# Patient Record
Sex: Male | Born: 1935
Health system: Southern US, Community
[De-identification: ages and names within clinical notes are randomized; demographics above are authoritative.]

## PROBLEM LIST (undated history)

## (undated) DIAGNOSIS — I2510 Atherosclerotic heart disease of native coronary artery without angina pectoris: Secondary | ICD-10-CM

## (undated) DIAGNOSIS — C44300 Unspecified malignant neoplasm of skin of unspecified part of face: Secondary | ICD-10-CM

## (undated) DIAGNOSIS — K5900 Constipation, unspecified: Secondary | ICD-10-CM

## (undated) DIAGNOSIS — M1990 Unspecified osteoarthritis, unspecified site: Secondary | ICD-10-CM

## (undated) DIAGNOSIS — J449 Chronic obstructive pulmonary disease, unspecified: Secondary | ICD-10-CM

## (undated) DIAGNOSIS — I10 Essential (primary) hypertension: Secondary | ICD-10-CM

## (undated) HISTORY — PX: CORONARY STENT PLACEMENT: SHX1402

## (undated) HISTORY — PX: BACK SURGERY: SHX140

---
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
## 2001-08-02 ENCOUNTER — Encounter: Payer: Self-pay | Admitting: Emergency Medicine

## 2001-08-02 ENCOUNTER — Emergency Department (HOSPITAL_COMMUNITY): Admission: EM | Admit: 2001-08-02 | Discharge: 2001-08-02 | Payer: Self-pay | Admitting: Emergency Medicine

## 2001-09-16 ENCOUNTER — Encounter: Payer: Self-pay | Admitting: *Deleted

## 2001-09-16 ENCOUNTER — Ambulatory Visit (HOSPITAL_COMMUNITY): Admission: RE | Admit: 2001-09-16 | Discharge: 2001-09-16 | Payer: Self-pay | Admitting: *Deleted

## 2001-09-22 ENCOUNTER — Encounter (HOSPITAL_COMMUNITY): Admission: RE | Admit: 2001-09-22 | Discharge: 2001-10-22 | Payer: Self-pay | Admitting: *Deleted

## 2001-11-02 ENCOUNTER — Encounter (HOSPITAL_COMMUNITY): Admission: RE | Admit: 2001-11-02 | Discharge: 2001-12-02 | Payer: Self-pay | Admitting: *Deleted

## 2001-12-04 ENCOUNTER — Encounter (HOSPITAL_COMMUNITY): Admission: RE | Admit: 2001-12-04 | Discharge: 2002-01-03 | Payer: Self-pay | Admitting: *Deleted

## 2002-01-04 ENCOUNTER — Encounter (HOSPITAL_COMMUNITY): Admission: RE | Admit: 2002-01-04 | Discharge: 2002-02-03 | Payer: Self-pay | Admitting: *Deleted

## 2002-02-23 ENCOUNTER — Ambulatory Visit (HOSPITAL_COMMUNITY): Admission: RE | Admit: 2002-02-23 | Discharge: 2002-02-23 | Payer: Self-pay | Admitting: Ophthalmology

## 2003-11-15 ENCOUNTER — Ambulatory Visit (HOSPITAL_COMMUNITY): Admission: RE | Admit: 2003-11-15 | Discharge: 2003-11-15 | Payer: Self-pay | Admitting: Pulmonary Disease

## 2008-07-06 ENCOUNTER — Ambulatory Visit (HOSPITAL_COMMUNITY): Admission: RE | Admit: 2008-07-06 | Discharge: 2008-07-06 | Payer: Self-pay | Admitting: Internal Medicine

## 2010-07-06 ENCOUNTER — Ambulatory Visit (HOSPITAL_COMMUNITY): Admission: RE | Admit: 2010-07-06 | Discharge: 2010-07-06 | Payer: Self-pay | Admitting: Pulmonary Disease

## 2011-07-22 ENCOUNTER — Other Ambulatory Visit (HOSPITAL_COMMUNITY): Payer: Self-pay | Admitting: Pulmonary Disease

## 2011-07-22 ENCOUNTER — Ambulatory Visit (HOSPITAL_COMMUNITY)
Admission: RE | Admit: 2011-07-22 | Discharge: 2011-07-22 | Disposition: A | Discharge status: Home / Self Care | Payer: PRIVATE HEALTH INSURANCE | Source: Ambulatory Visit | Attending: Pulmonary Disease | Admitting: Pulmonary Disease

## 2011-07-22 DIAGNOSIS — R61 Generalized hyperhidrosis: Secondary | ICD-10-CM

## 2011-07-22 DIAGNOSIS — I2510 Atherosclerotic heart disease of native coronary artery without angina pectoris: Secondary | ICD-10-CM | POA: Insufficient documentation

## 2012-08-11 ENCOUNTER — Encounter (HOSPITAL_COMMUNITY): Payer: Self-pay | Admitting: *Deleted

## 2012-08-11 ENCOUNTER — Emergency Department (HOSPITAL_COMMUNITY)
Admission: EM | Admit: 2012-08-11 | Discharge: 2012-08-12 | Disposition: A | Discharge status: Home / Self Care | Payer: PRIVATE HEALTH INSURANCE | Attending: Emergency Medicine | Admitting: Emergency Medicine

## 2012-08-11 ENCOUNTER — Emergency Department (HOSPITAL_COMMUNITY): Payer: PRIVATE HEALTH INSURANCE

## 2012-08-11 DIAGNOSIS — K5900 Constipation, unspecified: Secondary | ICD-10-CM | POA: Insufficient documentation

## 2012-08-11 DIAGNOSIS — J984 Other disorders of lung: Secondary | ICD-10-CM | POA: Insufficient documentation

## 2012-08-11 DIAGNOSIS — I2510 Atherosclerotic heart disease of native coronary artery without angina pectoris: Secondary | ICD-10-CM | POA: Insufficient documentation

## 2012-08-11 DIAGNOSIS — E119 Type 2 diabetes mellitus without complications: Secondary | ICD-10-CM | POA: Insufficient documentation

## 2012-08-11 DIAGNOSIS — I10 Essential (primary) hypertension: Secondary | ICD-10-CM | POA: Insufficient documentation

## 2012-08-11 HISTORY — DX: Constipation, unspecified: K59.00

## 2012-08-11 HISTORY — DX: Atherosclerotic heart disease of native coronary artery without angina pectoris: I25.10

## 2012-08-11 HISTORY — DX: Essential (primary) hypertension: I10

## 2012-08-11 LAB — BASIC METABOLIC PANEL
BUN: 26 mg/dL — ABNORMAL HIGH (ref 6–23)
Chloride: 96 mEq/L (ref 96–112)
GFR calc Af Amer: >90 mL/min (ref >90)
GFR calc non Af Amer: 81 mL/min — ABNORMAL LOW (ref >90)
Glucose, Bld: 177 mg/dL — ABNORMAL HIGH (ref 70–99)
Potassium: 4.2 mEq/L (ref 3.5–5.1)
Sodium: 135 mEq/L (ref 135–145)

## 2012-08-11 LAB — CBC WITH DIFFERENTIAL/PLATELET
Basophils Relative: 0 % (ref 0–1)
Eosinophils Absolute: 0.4 10*3/uL (ref 0.0–0.7)
Hemoglobin: 15.9 g/dL (ref 13.0–17.0)
Lymphs Abs: 2.4 10*3/uL (ref 0.7–4.0)
MCH: 30.5 pg (ref 26.0–34.0)
Monocytes Relative: 9 % (ref 3–12)
Neutro Abs: 8.8 10*3/uL — ABNORMAL HIGH (ref 1.7–7.7)
Neutrophils Relative %: 69 % (ref 43–77)
Platelets: 157 10*3/uL (ref 150–400)
RBC: 5.22 MIL/uL (ref 4.22–5.81)

## 2012-08-11 MED ORDER — IOHEXOL 300 MG/ML  SOLN
100.0000 mL | Freq: Once | INTRAMUSCULAR | Status: AC | PRN
  Administered 2012-08-11: 100 mL via INTRAVENOUS

## 2012-08-11 MED ORDER — POLYETHYLENE GLYCOL 3350 17 GM/SCOOP PO POWD: 17.0000 g | Freq: Every day | ORAL | Status: DC

## 2012-08-11 MED ORDER — SODIUM CHLORIDE 0.9 % IV BOLUS (SEPSIS)
500.0000 mL | Freq: Once | INTRAVENOUS | Status: AC
  Administered 2012-08-11: 22:00:00 via INTRAVENOUS

## 2012-08-11 MED ORDER — FLEET ENEMA 7-19 GM/118ML RE ENEM
1.0000 | ENEMA | Freq: Once | RECTAL | Status: AC
  Administered 2012-08-11: 1 via RECTAL

## 2012-08-11 MED ORDER — MAGNESIUM CITRATE PO SOLN: 296.0000 mL | Freq: Once | ORAL | Status: DC

## 2012-08-11 MED ORDER — SODIUM CHLORIDE 0.9 % IV SOLN: INTRAVENOUS | Status: DC

## 2012-08-11 NOTE — ED Provider Notes (Signed)
History  This chart was scribed for [employer]. Rubin Payor, MD by Bennett Scrape. This patient was seen in room APA01/APA01 and the patient's care was started at 1951  CSN: [id number]  Arrival date & time 08/11/12  1913   First MD Initiated Contact with Patient 08/11/12 1951      Chief Complaint  Patient presents with  . Constipation     The history is provided by the patient. No language interpreter was used.    Joshua Fletcher is a 76 y.o. male who presents to the Emergency Department complaining of constant constipation described as no BMs for the past 4 days with associated lower abdomen pain described as cramping. He also reports that he has cramping and pressure in the rectum associated with the urge to have a BM. He reported taking a stool softener with no improvement in symptoms. He states that he has experienced  similar symptoms occasionally attributed to his Percocet prescription for the chronic back pain. He denies having any changes. He denies fever, neck pain, sore throat, visual disturbance, CP, cough, SOB, nausea, emesis, diarrhea, urinary symptoms, HA, weakness, numbness and rash as associated symptoms. He has a h/o DM, HTN , CAD and an appendectomy. He is a current everyday smoker but denies alcohol use.   Past Medical History  Diagnosis Date  . Constipation   . Diabetes mellitus   . Hypertension   . Coronary artery disease     Past Surgical History  Procedure Date  . Back surgery   . Coronary stent placement   . Appendectomy 1958  appendectomy per pt at bedside   No family history on file.  History  Substance Use Topics  . Smoking status: Current Every Day Smoker -- 0.5 packs/day    Types: Cigarettes  . Smokeless tobacco: Not on file  . Alcohol Use: No      Review of Systems  Constitutional:       Per HPI, otherwise negative  HENT:       Per HPI, otherwise negative  Eyes: Negative.   Respiratory:       Per HPI, otherwise negative  Cardiovascular:         Per HPI, otherwise negative  Gastrointestinal: Positive for abdominal pain and constipation. Negative for vomiting.  Genitourinary: Negative.   Musculoskeletal:       Per HPI, otherwise negative  Skin: Negative.   Neurological: Negative for syncope.    Allergies  Review of patient's allergies indicates no known allergies.  Home Medications   Current Outpatient Rx  Name Route Sig Dispense Refill  . MAGNESIUM CITRATE PO SOLN Oral Take 296 mLs by mouth once. 300 mL 0  . POLYETHYLENE GLYCOL 3350 PO POWD Oral Take 17 g by mouth daily. 255 g 0    Triage Vitals: BP 140/72  Pulse 98  Temp 98.2 F (36.8 C) (Oral)  Resp 20  Ht 6\' 2"  (1.88 m)  Wt 200 lb (90.719 kg)  BMI 25.68 kg/m2  SpO2 99%  Physical Exam  Nursing note and vitals reviewed. Constitutional: He is oriented to person, place, and time. He appears well-developed and well-nourished. No distress.  HENT:  Head: Normocephalic and atraumatic.  Eyes: EOM are normal.  Neck: Neck supple. No tracheal deviation present.  Cardiovascular: Normal rate, regular rhythm and normal heart sounds.   Pulmonary/Chest: Effort normal and breath sounds normal. No respiratory distress.  Abdominal: He exhibits mass. There is tenderness. There is no rebound and no guarding.  suprapubic to RLQ mass with mild diffuse tenderness below the umbilicus, no rebound tenderness, no guarding, and no hernia  Musculoskeletal: Normal range of motion.  Neurological: He is alert and oriented to person, place, and time.  Skin: Skin is warm and dry.  Psychiatric: He has a normal mood and affect. His behavior is normal.   rectal exam showed some stool, although not at a  place it could be disimpacted. ED Course  Procedures (including critical care time)  DIAGNOSTIC STUDIES: Oxygen Saturation is 99% on room air, normal by my interpretation.    COORDINATION OF CARE: 2000- Discussed treatment plan with pt at bedside and pt agreed to plan. Ordered an  X-ray of the abdomen.   2210-Pt rechecked and is resting comfortably. Informed pt that x-ray showed a possible small bowel obstruction. Discussed plan to order CT and further blood work and pt agreed.   Labs Reviewed  CBC WITH DIFFERENTIAL - Abnormal; Notable for the following:    WBC 12.8 (*)     Neutro Abs 8.8 (*)     Monocytes Absolute 1.1 (*)     All other components within normal limits  BASIC METABOLIC PANEL - Abnormal; Notable for the following:    Glucose, Bld 177 (*)     BUN 26 (*)     GFR calc non Af Amer 81 (*)     All other components within normal limits   Ct Abdomen Pelvis W Contrast  08/11/2012  *RADIOLOGY REPORT*  Clinical Data: Abdominal pain.  Chronic constipation.  Diabetes. Hypertension.  CT ABDOMEN AND PELVIS WITH CONTRAST  Technique:  Multidetector CT imaging of the abdomen and pelvis was performed following the standard protocol during bolus administration of intravenous contrast.  Contrast: OMNIPAQUE IOHEXOL 300 MG/ML  SOLN  Comparison: 08/11/2012  Findings: Fibrosis noted in the lung bases.  Mild nodularity partially imaged in the right middle lobe measuring 5 mm, image 1 of series 3.  Distal esophageal wall thickening is observed.  Duodenal wall thickening is also appreciated.  Stomach unremarkable.  No significant hepatic or splenic lesion identified.  Pancreas and adrenal glands normal.  The kidneys appear unremarkable, as do the proximal ureters.  Aortoiliac atherosclerotic calcification noted.  Small porta hepatis nodes are not pathologically enlarged by size criteria.  The gallbladder and biliary system appear unremarkable.  No dilated bowel noted.  Scattered colonic diverticula noted without active diverticulitis.  Orally administered contrast extends through to the colon.  Urinary bladder appears unremarkable.  No free pelvic fluid or pelvic adenopathy.  Dextroconvex lumbar scoliosis noted with associated spondylosis. There is likely a right foraminal  impingement at L4-5.  Mild prominence of stool in the rectal vault noted.  IMPRESSION:  1.  Duodenal wall thickening may reflect duodenitis or mild sprue. 2.  There is also wall thickening in the distal esophagus circumferentially in its visualized portion, query esophagitis. Endoscopic assessment may be warranted. 3.  Mildly distended urinary bladder, likely incidental. 4.  Lumbar scoliosis and spondylosis.  Five of slight prominence of frothy stool in the rectal vault, but no dilated jejunum or ileum noted. 5.  Atherosclerosis. 6.  Fibrosis in the lung bases.  7.  5 mm nodule in the right middle lobe. If the patient is at high risk for bronchogenic carcinoma, follow-up chest CT at 6-12 months is recommended.  If the patient is at low risk for bronchogenic carcinoma, follow-up chest CT at 12 months is recommended.  This recommendation follows the consensus statement: Guidelines for  Management of Small Pulmonary Nodules Detected on CT Scans: A Statement from the Fleischner Society as published in Radiology 2005; 237:395-400.   Original Report Authenticated By: Dellia Cloud, M.D.    Dg Abd 2 Views  08/11/2012  *RADIOLOGY REPORT*  Clinical Data: Diabetes.  Hypertension.  Chronic constipation. Abdominal pain.  ABDOMEN - 2 VIEW  Comparison: 07/24/2010 lumbar spine radiographs  Findings: Dextroconvex lumbar scoliosis noted with underlying spondylosis and loss of disc height at multiple levels.  No free peroneal gas is observed beneath the hemidiaphragms. Several air-fluid levels are present in loops of small bowel with a borderline dilated loop of small bowel in the left abdomen.  Prominence of distal colonic stool is observed.  IMPRESSION:  1.  Abnormal but nonspecific appearance of the bowel, with scattered air-fluid levels in primarily nondilated loops of small bowel, although there is a borderline dilated loop of bowel in the left abdomen.  Appearance favors enteritis or ileus, although early obstruction  could have a similar appearance. 2.  Lumbar spondylosis, scoliosis, and degenerative disc disease.   Original Report Authenticated By: Dellia Cloud, M.D.      1. Constipation       MDM  Patient presents constipation. There is a palpable suprapubic mass. X-ray was done and showed possible early small bowel obstruction. CT was done and showed some wall thickening in the esophagus and a lung nodule. Both will need. Patient was given an enema in the ER and will be discharged home with mag citrate and MiraLAX. He will follow with his primary care Dr.      I personally performed the services described in this documentation, which was scribed in my presence. The recorded information has been reviewed and considered.     Juliet Rude. Rubin Payor, MD 08/11/12 [id number]

## 2012-08-11 NOTE — ED Notes (Signed)
Pt presents with c/o constipation, last BM was Friday. Abdominal cavity is soft, distended, with positive bowel sounds x 4 quads. Pt denies N/V, and fever. States has had this difficulty in past, just never this severe. Pt takes a narcotic pain medication and reports does not drink enough water.

## 2012-08-11 NOTE — ED Notes (Signed)
Pt states that he has not had a BM in 4 days, c/o pain lower abd pain

## 2015-01-20 ENCOUNTER — Other Ambulatory Visit (HOSPITAL_COMMUNITY): Payer: Self-pay | Admitting: Radiology

## 2015-01-20 DIAGNOSIS — G4730 Sleep apnea, unspecified: Secondary | ICD-10-CM

## 2015-01-20 DIAGNOSIS — G4710 Hypersomnia, unspecified: Secondary | ICD-10-CM

## 2015-02-16 ENCOUNTER — Ambulatory Visit: Payer: Medicare Other | Attending: Pulmonary Disease | Admitting: Sleep Medicine

## 2015-02-16 DIAGNOSIS — R0681 Apnea, not elsewhere classified: Secondary | ICD-10-CM | POA: Diagnosis present

## 2015-02-16 DIAGNOSIS — G4761 Periodic limb movement disorder: Secondary | ICD-10-CM | POA: Insufficient documentation

## 2015-02-16 DIAGNOSIS — G4730 Sleep apnea, unspecified: Secondary | ICD-10-CM

## 2015-02-16 DIAGNOSIS — G4710 Hypersomnia, unspecified: Secondary | ICD-10-CM

## 2015-02-16 DIAGNOSIS — G4733 Obstructive sleep apnea (adult) (pediatric): Secondary | ICD-10-CM | POA: Insufficient documentation

## 2015-02-18 NOTE — Sleep Study (Signed)
  [place] A. Merlene Laughter, MD     [email or web address]        NOCTURNAL POLYSOMNOGRAM    LOCATION: SLEEP LAB FACILITY: [facility]   PHYSICIAN: Caidance Sybert A. Merlene Laughter, M.D.   DATE OF STUDY: 02/16/2015.   REFERRING PHYSICIAN: Sinda Du.   INDICATIONS: The patient is a 79 year old male who complains of marked daytime drowsiness, witnessed apnea and fatigue.  MEDICATIONS:  Prior to Admission medications   Medication Sig Start Date End Date Taking? Authorizing Provider  magnesium citrate solution Take 296 mLs by mouth once. 08/11/12   Davonna Belling, MD  polyethylene glycol powder (GLYCOLAX/MIRALAX) powder Take 17 g by mouth daily. 08/11/12   Davonna Belling, MD      EPWORTH SLEEPINESS SCALE: 6.   BMI: 28.   ARCHITECTURAL SUMMARY: The study is a split-night recording with initial portion been a documentary and the second portion a titration recording. Total recording time was 392 minutes. Sleep efficiency 52 %. Sleep latency 5 minutes. REM latency 27 minutes scored by the computer but corrected to be 97.5 minutes manually. Stage NI 34 %, N2 56 % and N3 0 % and REM sleep 10 %.    RESPIRATORY DATA:  Baseline oxygen saturation is 93 %. The lowest saturation is 73 %. The diagnostic AHI is 65. The patient was placed on positive pressure starting at 5 and increased to 15. Titration was adequate but not optimal. The patient stayed awake for a lot of the recording indicated by the reduced sleep efficiency. Pressures of 13-14 seems to be adequate for the patient.  LIMB MOVEMENT SUMMARY: PLM index 30.   ELECTROCARDIOGRAM SUMMARY: Average heart rate is 64 with no significant dysrhythmias observed.   IMPRESSION:  1. Severe obstructive sleep apnea syndrome which responds to a CPAP of 14. 2. Moderate periodic limb movement disorder sleep. 3. Abnormal sleep architecture with reduced sleep efficiency and absent slow-wave sleep.  Thanks for this referral.  Kamau Weatherall A. Merlene Laughter,  M.D. Diplomat, [employer] adviser of [employer] Medicine.

## 2015-08-23 ENCOUNTER — Other Ambulatory Visit: Payer: Self-pay

## 2015-08-23 MED ORDER — INSULIN PEN NEEDLE 32G X 4 MM MISC: 24.0000 [IU] | Freq: Every day | Status: DC

## 2015-10-17 LAB — HEMOGLOBIN A1C: HEMOGLOBIN A1C: 7.4 % — AB (ref 4.0–6.0)

## 2015-10-24 ENCOUNTER — Ambulatory Visit (INDEPENDENT_AMBULATORY_CARE_PROVIDER_SITE_OTHER): Payer: Medicare Other | Admitting: "Endocrinology

## 2015-10-24 ENCOUNTER — Encounter: Payer: Self-pay | Admitting: "Endocrinology

## 2015-10-24 VITALS — BP 113/71 | HR 63 | Ht 72.0 in | Wt 201.0 lb

## 2015-10-24 DIAGNOSIS — E785 Hyperlipidemia, unspecified: Secondary | ICD-10-CM | POA: Diagnosis not present

## 2015-10-24 DIAGNOSIS — I10 Essential (primary) hypertension: Secondary | ICD-10-CM

## 2015-10-24 DIAGNOSIS — E1165 Type 2 diabetes mellitus with hyperglycemia: Secondary | ICD-10-CM | POA: Diagnosis not present

## 2015-10-24 DIAGNOSIS — IMO0002 Reserved for concepts with insufficient information to code with codable children: Secondary | ICD-10-CM

## 2015-10-24 DIAGNOSIS — Z794 Long term (current) use of insulin: Secondary | ICD-10-CM

## 2015-10-24 DIAGNOSIS — E118 Type 2 diabetes mellitus with unspecified complications: Secondary | ICD-10-CM | POA: Diagnosis not present

## 2015-10-24 DIAGNOSIS — E782 Mixed hyperlipidemia: Secondary | ICD-10-CM | POA: Insufficient documentation

## 2015-10-24 MED ORDER — INSULIN PEN NEEDLE 32G X 4 MM MISC: Status: DC

## 2015-10-24 NOTE — Progress Notes (Signed)
Subjective:    Patient ID: Joshua Fletcher, male    DOB: August 23, 1936, PCP Joshua Bogus, MD   Past Medical History  Diagnosis Date  . Constipation   . Diabetes mellitus   . Hypertension   . Coronary artery disease    Past Surgical History  Procedure Laterality Date  . Back surgery    . Coronary stent placement    . Appendectomy  1958   Social History   Social History  . Marital Status: Married    Spouse Name: N/A  . Number of Children: N/A  . Years of Education: N/A   Social History Main Topics  . Smoking status: Current Every Day Smoker -- 0.50 packs/day    Types: Cigarettes  . Smokeless tobacco: None  . Alcohol Use: No  . Drug Use: No  . Sexual Activity: Not Asked   Other Topics Concern  . None   Social History Narrative   Outpatient Encounter Prescriptions as of 10/24/2015  Medication Sig  . gabapentin (NEURONTIN) 300 MG capsule Take 300 mg by mouth at bedtime.  . Insulin Detemir (LEVEMIR FLEXTOUCH) 100 UNIT/ML Pen Inject 24 Units into the skin at bedtime.  Marland Kitchen lisinopril (PRINIVIL,ZESTRIL) 10 MG tablet Take 10 mg by mouth daily.  . metFORMIN (GLUCOPHAGE) 1000 MG tablet Take 1,000 mg by mouth 2 (two) times daily with a meal.  . metoprolol (LOPRESSOR) 50 MG tablet Take 50 mg by mouth 2 (two) times daily.  . simvastatin (ZOCOR) 40 MG tablet Take 40 mg by mouth daily.  . Insulin Pen Needle (BD PEN NEEDLE NANO U/F) 32G X 4 MM MISC Use as directed  . magnesium citrate solution Take 296 mLs by mouth once.  . polyethylene glycol powder (GLYCOLAX/MIRALAX) powder Take 17 g by mouth daily.  . [DISCONTINUED] Insulin Pen Needle (BD PEN NEEDLE NANO U/F) 32G X 4 MM MISC 24 Units by Does not apply route at bedtime.   No facility-administered encounter medications on file as of 10/24/2015.   ALLERGIES: No Known Allergies VACCINATION STATUS:  There is no immunization history on file for this patient.  HPI 79 yo male with type 2 DM x 16 years. He is here for f/u for  same. He did monitor BG at fasting, getting better and near target.  he is now consistent in taking his medications. He is on Levemir 20 units , and MTF 1000gm po BID. His recent A1c has improved to 7.4% from  8.5%. no hypoglycemia, but brings in only a week worth of logs.  He has reported complications including CAD which required stent placement, CHF and likely retinopathy. Pt is also being treated for neuropathy, pain syndrome, HTN and HPL. he continues to smoke 1PPD and he is not ready to quit. no other interval problems reported.  Review of Systems  Constitutional: Negative for fatigue and unexpected weight change.  HENT: Negative for dental problem, mouth sores and trouble swallowing.   Eyes: Negative for visual disturbance.  Respiratory: Negative for cough, choking, chest tightness, shortness of breath and wheezing.   Cardiovascular: Negative for chest pain, palpitations and leg swelling.  Gastrointestinal: Negative for nausea, vomiting, abdominal pain, diarrhea, constipation and abdominal distention.  Endocrine: Negative for polydipsia, polyphagia and polyuria.  Genitourinary: Negative for dysuria, urgency, hematuria and flank pain.  Musculoskeletal: Negative for myalgias, back pain, gait problem and neck pain.  Skin: Negative for pallor, rash and wound.  Neurological: Negative for seizures, syncope, weakness, numbness and headaches.  Psychiatric/Behavioral: Negative.  Negative  for confusion and dysphoric mood.    Objective:    BP 113/71 mmHg  Pulse 63  Ht 6' (1.829 m)  Wt 201 lb (91.173 kg)  BMI 27.25 kg/m2  SpO2 94%  Wt Readings from Last 3 Encounters:  10/24/15 201 lb (91.173 kg)  02/16/15 205 lb (92.987 kg)  08/11/12 200 lb (90.719 kg)    Physical Exam  Constitutional: He is oriented to person, place, and time. He appears well-developed and well-nourished. He is cooperative. No distress.  HENT:  Head: Normocephalic and atraumatic.  Eyes: EOM are normal.  Neck:  Normal range of motion. Neck supple. No tracheal deviation present. No thyromegaly present.  Cardiovascular: Normal rate, S1 normal, S2 normal and normal heart sounds.  Exam reveals no gallop.   No murmur heard. Pulses:      Dorsalis pedis pulses are 1+ on the right side, and 1+ on the left side.       Posterior tibial pulses are 1+ on the right side, and 1+ on the left side.  Pulmonary/Chest: Breath sounds normal. No respiratory distress. He has no wheezes.  Abdominal: Soft. Bowel sounds are normal. He exhibits no distension. There is no tenderness. There is no guarding and no CVA tenderness.  Musculoskeletal: He exhibits no edema.       Right shoulder: He exhibits no swelling and no deformity.  Neurological: He is alert and oriented to person, place, and time. He has normal strength and normal reflexes. No cranial nerve deficit or sensory deficit. Gait normal.  Skin: Skin is warm and dry. No rash noted. No cyanosis. Nails show no clubbing.  Psychiatric: He has a normal mood and affect. His speech is normal and behavior is normal. Judgment and thought content normal. Cognition and memory are normal.    Results for orders placed or performed in visit on 10/24/15  Hemoglobin A1c  Result Value Ref Range   Hgb A1c MFr Bld 7.4 (A) 4.0 - 6.0 %   Complete Blood Count (Most recent): Lab Results  Component Value Date   WBC 12.8* 08/11/2012   HGB 15.9 08/11/2012   HCT 46.9 08/11/2012   MCV 89.8 08/11/2012   PLT 157 08/11/2012   Chemistry (most recent): Lab Results  Component Value Date   NA 135 08/11/2012   K 4.2 08/11/2012   CL 96 08/11/2012   CO2 27 08/11/2012   BUN 26* 08/11/2012   CREATININE 0.95 08/11/2012      Assessment & Plan:   1. Uncontrolled type 2 diabetes mellitus with complication, with long-term current use of insulin ([place]) -His diabetes is  complicated by CAD and CKD and patient remains at a high risk for more acute and chronic complications of diabetes which  include CAD, CVA, CKD, retinopathy, and neuropathy. These are all discussed in detail with the patient.  Patient came with near target fasting  glucose profile, and  recent A1c of 7.4 %.  Glucose logs and insulin administration records pertaining to this visit,  to be scanned into patient's records.  Recent labs reviewed.   - I have re-counseled the patient on diet management  by adopting a carbohydrate restricted / protein rich  Diet.  - Suggestion is made for patient to avoid simple carbohydrates   from their diet including Cakes , Desserts, Ice Cream,  Soda (  diet and regular) , Sweet Tea , Candies,  Chips, Cookies, Artificial Sweeteners,   and "Sugar-free" Products .  This will help patient to have stable blood glucose  profile and potentially avoid unintended  Weight gain.  - Patient is advised to stick to a routine mealtimes to eat 3 meals  a day and avoid unnecessary snacks ( to snack only to correct hypoglycemia).   - I have approached patient with the following individualized plan to manage diabetes and patient agrees.  -I will continue basal insulin Levemir  24 units QHS.  -Patient is warned not to take insulin without proper monitoring per orders. -Adjustment parameters are given for hypo and hyperglycemia in writing. -Patient is encouraged to call clinic for blood glucose levels less than 70 or above 300 mg /dl. -For better insulin sensitivity I will increase MTF to 1000mg  po BID.   - Patient specific target  for A1c; LDL, HDL, Triglycerides, and  Waist Circumference were discussed in detail.  2) BP/HTN: controlled. Continue current medications includingACEI/ARB. 3) Lipids/HPL:  continue statins. 4)  Weight/Diet: CDE consult in progress, exercise, and carbohydrates information provided.  5) Chronic Care/Health Maintenance:  -Patient is on ACEI/ARB and Statin medications and encouraged to continue to follow up with Ophthalmology, Podiatrist at least yearly or according to  recommendations, and advised to quit smoking. I have recommended yearly flu vaccine and pneumonia vaccination at least every 5 years; moderate intensity exercise for up to 150 minutes weekly; and  sleep for at least 7 hours a day.  - 25 minutes of time was spent on the care of this patient , 50% of which was applied for counseling on diabetes complications and their preventions.  - I advised patient to maintain close follow up with Joshua Fletcher,Joshua L, MD for primary care needs.  Patient is asked to bring meter and  blood glucose logs during their next visit.   Follow up plan: -Return in about 3 months (around 01/23/2016) for diabetes, high blood pressure, high cholesterol.  Glade Lloyd, MD Phone: [phone]  Fax: [phone]   10/24/2015, 9:43 PM

## 2015-10-24 NOTE — Patient Instructions (Signed)

## 2015-11-06 ENCOUNTER — Other Ambulatory Visit: Payer: Self-pay | Admitting: "Endocrinology

## 2015-11-10 ENCOUNTER — Other Ambulatory Visit: Payer: Self-pay

## 2015-11-10 MED ORDER — INSULIN DETEMIR 100 UNIT/ML FLEXPEN: 24.0000 [IU] | PEN_INJECTOR | Freq: Every day | SUBCUTANEOUS | Status: DC

## 2015-12-19 DIAGNOSIS — E1140 Type 2 diabetes mellitus with diabetic neuropathy, unspecified: Secondary | ICD-10-CM | POA: Diagnosis not present

## 2015-12-19 DIAGNOSIS — E1151 Type 2 diabetes mellitus with diabetic peripheral angiopathy without gangrene: Secondary | ICD-10-CM | POA: Diagnosis not present

## 2016-01-24 ENCOUNTER — Other Ambulatory Visit: Payer: Self-pay | Admitting: "Endocrinology

## 2016-01-24 ENCOUNTER — Ambulatory Visit: Payer: Medicare Other | Admitting: "Endocrinology

## 2016-01-24 DIAGNOSIS — E118 Type 2 diabetes mellitus with unspecified complications: Secondary | ICD-10-CM | POA: Diagnosis not present

## 2016-01-24 DIAGNOSIS — Z794 Long term (current) use of insulin: Secondary | ICD-10-CM | POA: Diagnosis not present

## 2016-01-24 DIAGNOSIS — E1165 Type 2 diabetes mellitus with hyperglycemia: Secondary | ICD-10-CM | POA: Diagnosis not present

## 2016-01-24 LAB — BASIC METABOLIC PANEL
BUN: 31 mg/dL — ABNORMAL HIGH (ref 7–25)
CHLORIDE: 99 mmol/L (ref 98–110)
CO2: 26 mmol/L (ref 20–31)
Calcium: 9.1 mg/dL (ref 8.6–10.3)
Creat: 1.11 mg/dL (ref 0.70–1.18)
Glucose, Bld: 150 mg/dL — ABNORMAL HIGH (ref 65–99)
POTASSIUM: 4.9 mmol/L (ref 3.5–5.3)
SODIUM: 138 mmol/L (ref 135–146)

## 2016-01-24 LAB — HEMOGLOBIN A1C
Hgb A1c MFr Bld: 7.7 % — ABNORMAL HIGH (ref <5.7)
MEAN PLASMA GLUCOSE: 174 mg/dL — AB (ref <117)

## 2016-01-30 DIAGNOSIS — I2510 Atherosclerotic heart disease of native coronary artery without angina pectoris: Secondary | ICD-10-CM | POA: Diagnosis not present

## 2016-01-30 DIAGNOSIS — M545 Low back pain: Secondary | ICD-10-CM | POA: Diagnosis not present

## 2016-01-30 DIAGNOSIS — E119 Type 2 diabetes mellitus without complications: Secondary | ICD-10-CM | POA: Diagnosis not present

## 2016-01-30 DIAGNOSIS — I10 Essential (primary) hypertension: Secondary | ICD-10-CM | POA: Diagnosis not present

## 2016-02-02 ENCOUNTER — Encounter: Payer: Self-pay | Admitting: "Endocrinology

## 2016-02-02 ENCOUNTER — Ambulatory Visit (INDEPENDENT_AMBULATORY_CARE_PROVIDER_SITE_OTHER): Payer: Medicare Other | Admitting: "Endocrinology

## 2016-02-02 VITALS — Ht 72.0 in

## 2016-02-02 DIAGNOSIS — Z794 Long term (current) use of insulin: Secondary | ICD-10-CM

## 2016-02-02 DIAGNOSIS — I10 Essential (primary) hypertension: Secondary | ICD-10-CM | POA: Diagnosis not present

## 2016-02-02 DIAGNOSIS — E118 Type 2 diabetes mellitus with unspecified complications: Secondary | ICD-10-CM

## 2016-02-02 DIAGNOSIS — E1165 Type 2 diabetes mellitus with hyperglycemia: Secondary | ICD-10-CM | POA: Diagnosis not present

## 2016-02-02 DIAGNOSIS — E785 Hyperlipidemia, unspecified: Secondary | ICD-10-CM | POA: Diagnosis not present

## 2016-02-02 DIAGNOSIS — IMO0002 Reserved for concepts with insufficient information to code with codable children: Secondary | ICD-10-CM

## 2016-02-02 MED ORDER — INSULIN DETEMIR 100 UNIT/ML FLEXPEN: 30.0000 [IU] | PEN_INJECTOR | Freq: Every day | SUBCUTANEOUS | Status: DC

## 2016-02-02 NOTE — Progress Notes (Signed)
Subjective:    Patient ID: Joshua Fletcher, male    DOB: 1936-09-28, PCP Alonza Bogus, MD   Past Medical History  Diagnosis Date  . Constipation   . Diabetes mellitus   . Hypertension   . Coronary artery disease    Past Surgical History  Procedure Laterality Date  . Back surgery    . Coronary stent placement    . Appendectomy  1958   Social History   Social History  . Marital Status: Married    Spouse Name: N/A  . Number of Children: N/A  . Years of Education: N/A   Social History Main Topics  . Smoking status: Current Every Day Smoker -- 0.50 packs/day    Types: Cigarettes  . Smokeless tobacco: Not on file  . Alcohol Use: No  . Drug Use: No  . Sexual Activity: Not on file   Other Topics Concern  . Not on file   Social History Narrative   Outpatient Encounter Prescriptions as of 02/02/2016  Medication Sig  . gabapentin (NEURONTIN) 300 MG capsule Take 300 mg by mouth at bedtime.  . Insulin Detemir (LEVEMIR FLEXTOUCH) 100 UNIT/ML Pen Inject 30 Units into the skin at bedtime.  Marland Kitchen lisinopril (PRINIVIL,ZESTRIL) 10 MG tablet Take 10 mg by mouth daily.  . metFORMIN (GLUCOPHAGE) 1000 MG tablet Take 1,000 mg by mouth 2 (two) times daily with a meal.  . metoprolol (LOPRESSOR) 50 MG tablet Take 50 mg by mouth 2 (two) times daily.  . polyethylene glycol powder (GLYCOLAX/MIRALAX) powder Take 17 g by mouth daily.  . simvastatin (ZOCOR) 40 MG tablet Take 40 mg by mouth daily.  . tamsulosin (FLOMAX) 0.4 MG CAPS capsule Take 0.4 mg by mouth.  . [DISCONTINUED] Insulin Detemir (LEVEMIR FLEXTOUCH) 100 UNIT/ML Pen Inject 24 Units into the skin at bedtime.  . Insulin Pen Needle (BD PEN NEEDLE NANO U/F) 32G X 4 MM MISC Use as directed  . magnesium citrate solution Take 296 mLs by mouth once.   No facility-administered encounter medications on file as of 02/02/2016.   ALLERGIES: No Known Allergies VACCINATION STATUS:  There is no immunization history on file for this  patient.  HPI 80 yo male with type 2 DM x 16 years. He is here for f/u for same. He did monitor BG at fasting, getting better and near target.  he is now consistent in taking his medications. He is on Levemir 24 units , and MTF 1000gm po BID. His recent A1c has  Overall improved to 7.7% from  8.5%. no hypoglycemia, but brings in only a week worth of logs.  He has reported complications including CAD which required stent placement, CHF and likely retinopathy. Pt is also being treated for neuropathy, pain syndrome, HTN and HPL. he continues to smoke 1PPD and he is not ready to quit. no other interval problems reported.  Review of Systems  Constitutional: Negative for fatigue and unexpected weight change.  HENT: Negative for dental problem, mouth sores and trouble swallowing.   Eyes: Negative for visual disturbance.  Respiratory: Negative for cough, choking, chest tightness, shortness of breath and wheezing.   Cardiovascular: Negative for chest pain, palpitations and leg swelling.  Gastrointestinal: Negative for nausea, vomiting, abdominal pain, diarrhea, constipation and abdominal distention.  Endocrine: Negative for polydipsia, polyphagia and polyuria.  Genitourinary: Negative for dysuria, urgency, hematuria and flank pain.  Musculoskeletal: Negative for myalgias, back pain, gait problem and neck pain.  Skin: Negative for pallor, rash and wound.  Neurological:  Negative for seizures, syncope, weakness, numbness and headaches.  Psychiatric/Behavioral: Negative.  Negative for confusion and dysphoric mood.    Objective:    Ht 6' (1.829 m)  Wt Readings from Last 3 Encounters:  10/24/15 201 lb (91.173 kg)  02/16/15 205 lb (92.987 kg)  08/11/12 200 lb (90.719 kg)    Physical Exam  Constitutional: He is oriented to person, place, and time. He appears well-developed and well-nourished. He is cooperative. No distress.  HENT:  Head: Normocephalic and atraumatic.  Eyes: EOM are normal.   Neck: Normal range of motion. Neck supple. No tracheal deviation present. No thyromegaly present.  Cardiovascular: Normal rate, S1 normal, S2 normal and normal heart sounds.  Exam reveals no gallop.   No murmur heard. Pulses:      Dorsalis pedis pulses are 1+ on the right side, and 1+ on the left side.       Posterior tibial pulses are 1+ on the right side, and 1+ on the left side.  Pulmonary/Chest: Breath sounds normal. No respiratory distress. He has no wheezes.  Abdominal: Soft. Bowel sounds are normal. He exhibits no distension. There is no tenderness. There is no guarding and no CVA tenderness.  Musculoskeletal: He exhibits no edema.       Right shoulder: He exhibits no swelling and no deformity.  Neurological: He is alert and oriented to person, place, and time. He has normal strength and normal reflexes. No cranial nerve deficit or sensory deficit. Gait normal.  Skin: Skin is warm and dry. No rash noted. No cyanosis. Nails show no clubbing.  Psychiatric: He has a normal mood and affect. His speech is normal and behavior is normal. Judgment and thought content normal. Cognition and memory are normal.    Results for orders placed or performed in visit on [id number]  Basic metabolic panel  Result Value Ref Range   Sodium 138 135 - 146 mmol/L   Potassium 4.9 3.5 - 5.3 mmol/L   Chloride 99 98 - 110 mmol/L   CO2 26 20 - 31 mmol/L   Glucose, Bld 150 (H) 65 - 99 mg/dL   BUN 31 (H) 7 - 25 mg/dL   Creat 1.11 0.70 - 1.18 mg/dL   Calcium 9.1 8.6 - 10.3 mg/dL  Hemoglobin A1c  Result Value Ref Range   Hgb A1c MFr Bld 7.7 (H) <5.7 %   Mean Plasma Glucose 174 (H) <117 mg/dL   Complete Blood Count (Most recent): Lab Results  Component Value Date   WBC 12.8* 08/11/2012   HGB 15.9 08/11/2012   HCT 46.9 08/11/2012   MCV 89.8 08/11/2012   PLT 157 08/11/2012   Chemistry (most recent): Lab Results  Component Value Date   NA 138 01/24/2016   K 4.9 01/24/2016   CL 99 01/24/2016   CO2 26  01/24/2016   BUN 31* 01/24/2016   CREATININE 1.11 01/24/2016      Assessment & Plan:   1. Uncontrolled type 2 diabetes mellitus with complication, with long-term current use of insulin ([place]) -His diabetes is  complicated by CAD and CKD and patient remains at a high risk for more acute and chronic complications of diabetes which include CAD, CVA, CKD, retinopathy, and neuropathy. These are all discussed in detail with the patient.  Patient came with near target fasting  glucose profile, and  recent A1c of 7.7 %.  Glucose logs and insulin administration records pertaining to this visit,  to be scanned into patient's records.  Recent labs reviewed.   -  I have re-counseled the patient on diet management  by adopting a carbohydrate restricted / protein rich  Diet.  - Suggestion is made for patient to avoid simple carbohydrates   from their diet including Cakes , Desserts, Ice Cream,  Soda (  diet and regular) , Sweet Tea , Candies,  Chips, Cookies, Artificial Sweeteners,   and "Sugar-free" Products .  This will help patient to have stable blood glucose profile and potentially avoid unintended  Weight gain.  - Patient is advised to stick to a routine mealtimes to eat 3 meals  a day and avoid unnecessary snacks ( to snack only to correct hypoglycemia).   - I have approached patient with the following individualized plan to manage diabetes and patient agrees.  -I will continue basal insulin Levemir  24 units QHS.  -Patient is warned not to take insulin without proper monitoring per orders. -Adjustment parameters are given for hypo and hyperglycemia in writing. -Patient is encouraged to call clinic for blood glucose levels less than 70 or above 300 mg /dl. -For better insulin sensitivity I will increase MTF to 1000mg  po BID.   - Patient specific target  for A1c; LDL, HDL, Triglycerides, and  Waist Circumference were discussed in detail.  2) BP/HTN: controlled. Continue current medications  includingACEI/ARB. 3) Lipids/HPL:  continue statins. 4)  Weight/Diet: CDE consult in progress, exercise, and carbohydrates information provided.  5) Chronic Care/Health Maintenance:  -Patient is on ACEI/ARB and Statin medications and encouraged to continue to follow up with Ophthalmology, Podiatrist at least yearly or according to recommendations, and advised to quit smoking. I have recommended yearly flu vaccine and pneumonia vaccination at least every 5 years; moderate intensity exercise for up to 150 minutes weekly; and  sleep for at least 7 hours a day.  - 25 minutes of time was spent on the care of this patient , 50% of which was applied for counseling on diabetes complications and their preventions.  - I advised patient to maintain close follow up with HAWKINS,EDWARD L, MD for primary care needs.  Patient is asked to bring meter and  blood glucose logs during their next visit.   Follow up plan: -Return in about 3 months (around 05/04/2016) for diabetes, high blood pressure, high cholesterol, follow up with pre-visit labs, meter, and logs.  Glade Lloyd, MD Phone: [phone]  Fax: [phone]   02/02/2016, 8:35 PM

## 2016-02-02 NOTE — Patient Instructions (Signed)
Advice for weight management -For most of us the best way to lose weight is by diet management. Generally speaking, diet management means restricting carbohydrate consumption to minimum possible (and to unprocessed or minimally processed complex starch) and increasing protein intake (animal or plant source), fruits, and vegetables.  -Sticking to a routine mealtime to eat 3 meals a day and avoiding unnecessary snacks is shown to have a big role in weight control.  -It is better to avoid simple carbohydrates including: Cakes, Desserts, Ice Cream, Soda (diet and regular), Sweet Tea, Candies, Chips, Cookies, Artificial Sweeteners, and "Sugar-free" Products.   -Exercise: 30 minutes a day 3-4 days a week, or 150 minutes a week. Combine stretch, strength, and aerobic activities. You may seek evaluation by your heart doctor prior to initiating exercise if you have high risk for heart disease.  -If you are interested, we can schedule a visit with Joshua Fletcher, RDN, CDE for individualized nutrition education.  

## 2016-03-05 DIAGNOSIS — E1151 Type 2 diabetes mellitus with diabetic peripheral angiopathy without gangrene: Secondary | ICD-10-CM | POA: Diagnosis not present

## 2016-03-05 DIAGNOSIS — E1140 Type 2 diabetes mellitus with diabetic neuropathy, unspecified: Secondary | ICD-10-CM | POA: Diagnosis not present

## 2016-04-18 DIAGNOSIS — H5200 Hypermetropia, unspecified eye: Secondary | ICD-10-CM | POA: Diagnosis not present

## 2016-04-18 DIAGNOSIS — I10 Essential (primary) hypertension: Secondary | ICD-10-CM | POA: Diagnosis not present

## 2016-04-18 DIAGNOSIS — H2510 Age-related nuclear cataract, unspecified eye: Secondary | ICD-10-CM | POA: Diagnosis not present

## 2016-04-18 DIAGNOSIS — Z0100 Encounter for examination of eyes and vision without abnormal findings: Secondary | ICD-10-CM | POA: Diagnosis not present

## 2016-04-18 DIAGNOSIS — E109 Type 1 diabetes mellitus without complications: Secondary | ICD-10-CM | POA: Diagnosis not present

## 2016-04-19 ENCOUNTER — Other Ambulatory Visit: Payer: Self-pay

## 2016-04-19 DIAGNOSIS — R69 Illness, unspecified: Secondary | ICD-10-CM | POA: Diagnosis not present

## 2016-04-19 MED ORDER — GLUCOSE BLOOD VI STRP
ORAL_STRIP | Status: DC
Start: 2016-04-19 — End: 2017-08-22

## 2016-04-29 ENCOUNTER — Other Ambulatory Visit: Payer: Self-pay | Admitting: "Endocrinology

## 2016-04-29 DIAGNOSIS — Z794 Long term (current) use of insulin: Secondary | ICD-10-CM | POA: Diagnosis not present

## 2016-04-29 DIAGNOSIS — E118 Type 2 diabetes mellitus with unspecified complications: Secondary | ICD-10-CM | POA: Diagnosis not present

## 2016-04-29 DIAGNOSIS — E1165 Type 2 diabetes mellitus with hyperglycemia: Secondary | ICD-10-CM | POA: Diagnosis not present

## 2016-04-29 LAB — BASIC METABOLIC PANEL
BUN: 23 mg/dL (ref 7–25)
CHLORIDE: 99 mmol/L (ref 98–110)
CO2: 27 mmol/L (ref 20–31)
Calcium: 8.9 mg/dL (ref 8.6–10.3)
Creat: 1.04 mg/dL (ref 0.70–1.18)
Glucose, Bld: 115 mg/dL — ABNORMAL HIGH (ref 65–99)
POTASSIUM: 4.5 mmol/L (ref 3.5–5.3)
SODIUM: 136 mmol/L (ref 135–146)

## 2016-04-29 LAB — HEMOGLOBIN A1C
Hgb A1c MFr Bld: 7.2 % — ABNORMAL HIGH (ref <5.7)
MEAN PLASMA GLUCOSE: 160 mg/dL

## 2016-05-02 DIAGNOSIS — I2510 Atherosclerotic heart disease of native coronary artery without angina pectoris: Secondary | ICD-10-CM | POA: Diagnosis not present

## 2016-05-02 DIAGNOSIS — M1990 Unspecified osteoarthritis, unspecified site: Secondary | ICD-10-CM | POA: Diagnosis not present

## 2016-05-02 DIAGNOSIS — M545 Low back pain: Secondary | ICD-10-CM | POA: Diagnosis not present

## 2016-05-02 DIAGNOSIS — E119 Type 2 diabetes mellitus without complications: Secondary | ICD-10-CM | POA: Diagnosis not present

## 2016-05-06 ENCOUNTER — Ambulatory Visit (INDEPENDENT_AMBULATORY_CARE_PROVIDER_SITE_OTHER): Payer: Medicare HMO | Admitting: "Endocrinology

## 2016-05-06 ENCOUNTER — Encounter: Payer: Self-pay | Admitting: "Endocrinology

## 2016-05-06 VITALS — BP 145/78 | HR 63 | Ht 72.0 in | Wt 204.0 lb

## 2016-05-06 DIAGNOSIS — I10 Essential (primary) hypertension: Secondary | ICD-10-CM

## 2016-05-06 DIAGNOSIS — E785 Hyperlipidemia, unspecified: Secondary | ICD-10-CM

## 2016-05-06 DIAGNOSIS — Z794 Long term (current) use of insulin: Secondary | ICD-10-CM | POA: Diagnosis not present

## 2016-05-06 DIAGNOSIS — E1165 Type 2 diabetes mellitus with hyperglycemia: Secondary | ICD-10-CM | POA: Diagnosis not present

## 2016-05-06 DIAGNOSIS — IMO0002 Reserved for concepts with insufficient information to code with codable children: Secondary | ICD-10-CM

## 2016-05-06 DIAGNOSIS — E118 Type 2 diabetes mellitus with unspecified complications: Secondary | ICD-10-CM

## 2016-05-06 NOTE — Patient Instructions (Signed)

## 2016-05-06 NOTE — Progress Notes (Signed)
Subjective:    Patient ID: Joshua Fletcher, male    DOB: 03-Dec-1935, PCP Alonza Bogus, MD   Past Medical History  Diagnosis Date  . Constipation   . Diabetes mellitus   . Hypertension   . Coronary artery disease    Past Surgical History  Procedure Laterality Date  . Back surgery    . Coronary stent placement    . Appendectomy  1958   Social History   Social History  . Marital Status: Married    Spouse Name: N/A  . Number of Children: N/A  . Years of Education: N/A   Social History Main Topics  . Smoking status: Current Every Day Smoker -- 0.50 packs/day    Types: Cigarettes  . Smokeless tobacco: None  . Alcohol Use: No  . Drug Use: No  . Sexual Activity: Not Asked   Other Topics Concern  . None   Social History Narrative   Outpatient Encounter Prescriptions as of 05/06/2016  Medication Sig  . gabapentin (NEURONTIN) 300 MG capsule Take 300 mg by mouth at bedtime.  Marland Kitchen glucose blood test strip Four times daily testing Dx: E11.8  . Insulin Detemir (LEVEMIR FLEXTOUCH) 100 UNIT/ML Pen Inject 30 Units into the skin at bedtime.  . Insulin Pen Needle (BD PEN NEEDLE NANO U/F) 32G X 4 MM MISC Use as directed  . lisinopril (PRINIVIL,ZESTRIL) 10 MG tablet Take 10 mg by mouth daily.  . magnesium citrate solution Take 296 mLs by mouth once.  . metFORMIN (GLUCOPHAGE) 1000 MG tablet Take 1,000 mg by mouth 2 (two) times daily with a meal.  . metoprolol (LOPRESSOR) 50 MG tablet Take 50 mg by mouth 2 (two) times daily.  . polyethylene glycol powder (GLYCOLAX/MIRALAX) powder Take 17 g by mouth daily.  . simvastatin (ZOCOR) 40 MG tablet Take 40 mg by mouth daily.  . tamsulosin (FLOMAX) 0.4 MG CAPS capsule Take 0.4 mg by mouth.  . [DISCONTINUED] LEVEMIR FLEXTOUCH 100 UNIT/ML Pen INJECT 24 UNITS INTO THE SKIN AT BEDTIME   No facility-administered encounter medications on file as of 05/06/2016.   ALLERGIES: No Known Allergies VACCINATION STATUS:  There is no immunization  history on file for this patient.  HPI 80 yo male with type 2 DM x 16 years. He is here for f/u for same. He did monitor BG at fasting, getting better and near target.  he is now consistent in taking his medications. He is on Levemir 24 units , and MTF 1000gm po BID. His recent A1c has l improved to 7.2% from  8.5%. no hypoglycemia. He has reported complications including CAD which required stent placement, CHF and likely retinopathy. Pt is also being treated for neuropathy, pain syndrome, HTN and HPL. he continues to smoke 1PPD and he is not ready to quit. no other interval problems reported.  Review of Systems  Constitutional: Negative for fatigue and unexpected weight change.  HENT: Negative for dental problem, mouth sores and trouble swallowing.   Eyes: Negative for visual disturbance.  Respiratory: Negative for cough, choking, chest tightness, shortness of breath and wheezing.   Cardiovascular: Negative for chest pain, palpitations and leg swelling.  Gastrointestinal: Negative for nausea, vomiting, abdominal pain, diarrhea, constipation and abdominal distention.  Endocrine: Negative for polydipsia, polyphagia and polyuria.  Genitourinary: Negative for dysuria, urgency, hematuria and flank pain.  Musculoskeletal: Negative for myalgias, back pain, gait problem and neck pain.  Skin: Negative for pallor, rash and wound.  Neurological: Negative for seizures, syncope, weakness, numbness  and headaches.  Psychiatric/Behavioral: Negative.  Negative for confusion and dysphoric mood.    Objective:    BP 145/78 mmHg  Pulse 63  Ht 6' (1.829 m)  Wt 204 lb (92.534 kg)  BMI 27.66 kg/m2  SpO2 97%  Wt Readings from Last 3 Encounters:  05/06/16 204 lb (92.534 kg)  10/24/15 201 lb (91.173 kg)  02/16/15 205 lb (92.987 kg)    Physical Exam  Constitutional: He is oriented to person, place, and time. He appears well-developed and well-nourished. He is cooperative. No distress.  HENT:  Head:  Normocephalic and atraumatic.  Eyes: EOM are normal.  Neck: Normal range of motion. Neck supple. No tracheal deviation present. No thyromegaly present.  Cardiovascular: Normal rate, S1 normal, S2 normal and normal heart sounds.  Exam reveals no gallop.   No murmur heard. Pulses:      Dorsalis pedis pulses are 1+ on the right side, and 1+ on the left side.       Posterior tibial pulses are 1+ on the right side, and 1+ on the left side.  Pulmonary/Chest: Breath sounds normal. No respiratory distress. He has no wheezes.  Abdominal: Soft. Bowel sounds are normal. He exhibits no distension. There is no tenderness. There is no guarding and no CVA tenderness.  Musculoskeletal: He exhibits no edema.       Right shoulder: He exhibits no swelling and no deformity.  Neurological: He is alert and oriented to person, place, and time. He has normal strength and normal reflexes. No cranial nerve deficit or sensory deficit. Gait normal.  Skin: Skin is warm and dry. No rash noted. No cyanosis. Nails show no clubbing.  Psychiatric: He has a normal mood and affect. His speech is normal and behavior is normal. Judgment and thought content normal. Cognition and memory are normal.    Results for orders placed or performed in visit on [id number]  Basic metabolic panel  Result Value Ref Range   Sodium 136 135 - 146 mmol/L   Potassium 4.5 3.5 - 5.3 mmol/L   Chloride 99 98 - 110 mmol/L   CO2 27 20 - 31 mmol/L   Glucose, Bld 115 (H) 65 - 99 mg/dL   BUN 23 7 - 25 mg/dL   Creat 1.04 0.70 - 1.18 mg/dL   Calcium 8.9 8.6 - 10.3 mg/dL  Hemoglobin A1c  Result Value Ref Range   Hgb A1c MFr Bld 7.2 (H) <5.7 %   Mean Plasma Glucose 160 mg/dL   Complete Blood Count (Most recent): Lab Results  Component Value Date   WBC 12.8* 08/11/2012   HGB 15.9 08/11/2012   HCT 46.9 08/11/2012   MCV 89.8 08/11/2012   PLT 157 08/11/2012   Chemistry (most recent): Lab Results  Component Value Date   NA 136 04/29/2016   K 4.5  04/29/2016   CL 99 04/29/2016   CO2 27 04/29/2016   BUN 23 04/29/2016   CREATININE 1.04 04/29/2016      Assessment & Plan:   1. Uncontrolled type 2 diabetes mellitus with complication, with long-term current use of insulin ([place]) -His diabetes is  complicated by CAD and CKD and patient remains at a high risk for more acute and chronic complications of diabetes which include CAD, CVA, CKD, retinopathy, and neuropathy. These are all discussed in detail with the patient.  Patient came with near target fasting  glucose profile, and  recent A1c of 7.2 %.  Glucose logs and insulin administration records pertaining to this visit,  to be scanned into patient's records.  Recent labs reviewed.   - I have re-counseled the patient on diet management  by adopting a carbohydrate restricted / protein rich  Diet.  - Suggestion is made for patient to avoid simple carbohydrates   from their diet including Cakes , Desserts, Ice Cream,  Soda (  diet and regular) , Sweet Tea , Candies,  Chips, Cookies, Artificial Sweeteners,   and "Sugar-free" Products .  This will help patient to have stable blood glucose profile and potentially avoid unintended  Weight gain.  - Patient is advised to stick to a routine mealtimes to eat 3 meals  a day and avoid unnecessary snacks ( to snack only to correct hypoglycemia).   - I have approached patient with the following individualized plan to manage diabetes and patient agrees.  -I will continue basal insulin Levemir  24 units QHS.  -Patient is warned not to take insulin without proper monitoring per orders. -Adjustment parameters are given for hypo and hyperglycemia in writing. -Patient is encouraged to call clinic for blood glucose levels less than 70 or above 300 mg /dl. -For better insulin sensitivity I will increase MTF to 1000mg  po BID.   - Patient specific target  for A1c; LDL, HDL, Triglycerides, and  Waist Circumference were discussed in detail.  2) BP/HTN:  controlled. Continue current medications includingACEI/ARB. 3) Lipids/HPL:  continue statins. 4)  Weight/Diet: CDE consult in progress, exercise, and carbohydrates information provided.  5) Chronic Care/Health Maintenance:  -Patient is on ACEI/ARB and Statin medications and encouraged to continue to follow up with Ophthalmology, Podiatrist at least yearly or according to recommendations, and advised to quit smoking. I have recommended yearly flu vaccine and pneumonia vaccination at least every 5 years; moderate intensity exercise for up to 150 minutes weekly; and  sleep for at least 7 hours a day.  - 25 minutes of time was spent on the care of this patient , 50% of which was applied for counseling on diabetes complications and their preventions.  - I advised patient to maintain close follow up with HAWKINS,EDWARD L, MD for primary care needs.  Patient is asked to bring meter and  blood glucose logs during their next visit.   Follow up plan: -Return in about 3 months (around 08/06/2016) for diabetes, high blood pressure, high cholesterol, follow up with pre-visit labs, meter, and logs.  Glade Lloyd, MD Phone: [phone]  Fax: [phone]   05/06/2016, 10:22 AM

## 2016-05-14 DIAGNOSIS — E1151 Type 2 diabetes mellitus with diabetic peripheral angiopathy without gangrene: Secondary | ICD-10-CM | POA: Diagnosis not present

## 2016-05-14 DIAGNOSIS — E1140 Type 2 diabetes mellitus with diabetic neuropathy, unspecified: Secondary | ICD-10-CM | POA: Diagnosis not present

## 2016-06-17 ENCOUNTER — Other Ambulatory Visit: Payer: Self-pay | Admitting: "Endocrinology

## 2016-07-30 ENCOUNTER — Other Ambulatory Visit: Payer: Self-pay | Admitting: "Endocrinology

## 2016-07-30 DIAGNOSIS — E1165 Type 2 diabetes mellitus with hyperglycemia: Secondary | ICD-10-CM | POA: Diagnosis not present

## 2016-07-30 DIAGNOSIS — E118 Type 2 diabetes mellitus with unspecified complications: Secondary | ICD-10-CM | POA: Diagnosis not present

## 2016-07-30 DIAGNOSIS — E785 Hyperlipidemia, unspecified: Secondary | ICD-10-CM | POA: Diagnosis not present

## 2016-07-30 DIAGNOSIS — E1151 Type 2 diabetes mellitus with diabetic peripheral angiopathy without gangrene: Secondary | ICD-10-CM | POA: Diagnosis not present

## 2016-07-30 DIAGNOSIS — E1140 Type 2 diabetes mellitus with diabetic neuropathy, unspecified: Secondary | ICD-10-CM | POA: Diagnosis not present

## 2016-07-30 DIAGNOSIS — Z794 Long term (current) use of insulin: Secondary | ICD-10-CM | POA: Diagnosis not present

## 2016-07-30 LAB — LIPID PANEL
CHOL/HDL RATIO: 2.4 ratio (ref <=5.0)
CHOLESTEROL: 99 mg/dL — AB (ref 125–200)
HDL: 42 mg/dL (ref >=40)
LDL CALC: 26 mg/dL (ref <130)
Triglycerides: 157 mg/dL — ABNORMAL HIGH (ref <150)
VLDL: 31 mg/dL — AB (ref <30)

## 2016-07-30 LAB — COMPLETE METABOLIC PANEL WITH GFR
ALT: 10 U/L (ref 9–46)
AST: 17 U/L (ref 10–35)
Albumin: 3.7 g/dL (ref 3.6–5.1)
Alkaline Phosphatase: 56 U/L (ref 40–115)
BUN: 20 mg/dL (ref 7–25)
CALCIUM: 8.9 mg/dL (ref 8.6–10.3)
CHLORIDE: 100 mmol/L (ref 98–110)
CO2: 34 mmol/L — AB (ref 20–31)
CREATININE: 1.08 mg/dL (ref 0.70–1.18)
GFR, Est African American: 76 mL/min (ref >=60)
GFR, Est Non African American: 66 mL/min (ref >=60)
GLUCOSE: 83 mg/dL (ref 65–99)
Potassium: 4.5 mmol/L (ref 3.5–5.3)
SODIUM: 139 mmol/L (ref 135–146)
Total Bilirubin: 0.4 mg/dL (ref 0.2–1.2)
Total Protein: 6.5 g/dL (ref 6.1–8.1)

## 2016-07-31 LAB — HEMOGLOBIN A1C
HEMOGLOBIN A1C: 5.9 % — AB (ref <5.7)
MEAN PLASMA GLUCOSE: 123 mg/dL

## 2016-08-06 ENCOUNTER — Encounter: Payer: Self-pay | Admitting: "Endocrinology

## 2016-08-06 ENCOUNTER — Ambulatory Visit (INDEPENDENT_AMBULATORY_CARE_PROVIDER_SITE_OTHER): Payer: Medicare HMO | Admitting: "Endocrinology

## 2016-08-06 VITALS — BP 172/54 | HR 58 | Ht 72.0 in | Wt 200.0 lb

## 2016-08-06 DIAGNOSIS — E785 Hyperlipidemia, unspecified: Secondary | ICD-10-CM | POA: Diagnosis not present

## 2016-08-06 DIAGNOSIS — E1165 Type 2 diabetes mellitus with hyperglycemia: Secondary | ICD-10-CM

## 2016-08-06 DIAGNOSIS — I10 Essential (primary) hypertension: Secondary | ICD-10-CM

## 2016-08-06 DIAGNOSIS — IMO0002 Reserved for concepts with insufficient information to code with codable children: Secondary | ICD-10-CM

## 2016-08-06 DIAGNOSIS — E118 Type 2 diabetes mellitus with unspecified complications: Secondary | ICD-10-CM | POA: Diagnosis not present

## 2016-08-06 DIAGNOSIS — Z794 Long term (current) use of insulin: Secondary | ICD-10-CM | POA: Diagnosis not present

## 2016-08-06 MED ORDER — INSULIN DETEMIR 100 UNIT/ML FLEXPEN: 20.0000 [IU] | PEN_INJECTOR | Freq: Every day | SUBCUTANEOUS | 2 refills | Status: DC

## 2016-08-06 NOTE — Progress Notes (Signed)
Subjective:    Patient ID: Joshua Fletcher, male    DOB: 29-May-1936, PCP Alonza Bogus, MD   Past Medical History:  Diagnosis Date  . Constipation   . Coronary artery disease   . Diabetes mellitus   . Hypertension    Past Surgical History:  Procedure Laterality Date  . APPENDECTOMY  1958  . BACK SURGERY    . CORONARY STENT PLACEMENT     Social History   Social History  . Marital status: Married    Spouse name: N/A  . Number of children: N/A  . Years of education: N/A   Social History Main Topics  . Smoking status: Current Every Day Smoker    Packs/day: 0.50    Types: Cigarettes  . Smokeless tobacco: None  . Alcohol use No  . Drug use: No  . Sexual activity: Not Asked   Other Topics Concern  . None   Social History Narrative  . None   Outpatient Encounter Prescriptions as of 08/06/2016  Medication Sig  . B-D ULTRAFINE III SHORT PEN 31G X 8 MM MISC USE ONE  AS DIRECTED  . gabapentin (NEURONTIN) 300 MG capsule Take 300 mg by mouth at bedtime.  Marland Kitchen glucose blood test strip Four times daily testing Dx: E11.8  . Insulin Detemir (LEVEMIR FLEXTOUCH) 100 UNIT/ML Pen Inject 20 Units into the skin at bedtime.  . Insulin Pen Needle (BD PEN NEEDLE NANO U/F) 32G X 4 MM MISC Use as directed  . lisinopril (PRINIVIL,ZESTRIL) 10 MG tablet Take 10 mg by mouth daily.  . magnesium citrate solution Take 296 mLs by mouth once.  . metFORMIN (GLUCOPHAGE) 1000 MG tablet Take 1,000 mg by mouth 2 (two) times daily with a meal.  . metoprolol (LOPRESSOR) 50 MG tablet Take 50 mg by mouth 2 (two) times daily.  . polyethylene glycol powder (GLYCOLAX/MIRALAX) powder Take 17 g by mouth daily.  . simvastatin (ZOCOR) 40 MG tablet Take 40 mg by mouth daily.  . tamsulosin (FLOMAX) 0.4 MG CAPS capsule Take 0.4 mg by mouth.  . [DISCONTINUED] Insulin Detemir (LEVEMIR FLEXTOUCH) 100 UNIT/ML Pen Inject 30 Units into the skin at bedtime.   No facility-administered encounter medications on file as of  08/06/2016.    ALLERGIES: No Known Allergies VACCINATION STATUS:  There is no immunization history on file for this patient.  HPI 80 yo male with type 2 DM x 16 years. He is here for f/u for same. He did monitor BG at fasting, getting better and near target.  he is now consistent in taking his medications. He is on Levemir 24 units , and MTF 1000gm po BID. His recent A1c has l improved to 5.9% from  8.5%. no hypoglycemia. He has reported complications including CAD which required stent placement, CHF and likely retinopathy. Pt is also being treated for neuropathy, pain syndrome, HTN and HPL. he continues to smoke 1PPD and he is not ready to quit. no other interval problems reported.  Review of Systems  Constitutional: Negative for fatigue and unexpected weight change.  HENT: Negative for dental problem, mouth sores and trouble swallowing.   Eyes: Negative for visual disturbance.  Respiratory: Negative for cough, choking, chest tightness, shortness of breath and wheezing.   Cardiovascular: Negative for chest pain, palpitations and leg swelling.  Gastrointestinal: Negative for abdominal distention, abdominal pain, constipation, diarrhea, nausea and vomiting.  Endocrine: Negative for polydipsia, polyphagia and polyuria.  Genitourinary: Negative for dysuria, flank pain, hematuria and urgency.  Musculoskeletal: Negative  for back pain, gait problem, myalgias and neck pain.  Skin: Negative for pallor, rash and wound.  Neurological: Negative for seizures, syncope, weakness, numbness and headaches.  Psychiatric/Behavioral: Negative.  Negative for confusion and dysphoric mood.    Objective:    BP (!) 172/54   Pulse (!) 58   Ht 6' (1.829 m)   Wt 200 lb (90.7 kg)   BMI 27.12 kg/m   Wt Readings from Last 3 Encounters:  08/06/16 200 lb (90.7 kg)  05/06/16 204 lb (92.5 kg)  10/24/15 201 lb (91.2 kg)    Physical Exam  Constitutional: He is oriented to person, place, and time. He appears  well-developed and well-nourished. He is cooperative. No distress.  HENT:  Head: Normocephalic and atraumatic.  Eyes: EOM are normal.  Neck: Normal range of motion. Neck supple. No tracheal deviation present. No thyromegaly present.  Cardiovascular: Normal rate, S1 normal, S2 normal and normal heart sounds.  Exam reveals no gallop.   No murmur heard. Pulses:      Dorsalis pedis pulses are 1+ on the right side, and 1+ on the left side.       Posterior tibial pulses are 1+ on the right side, and 1+ on the left side.  Pulmonary/Chest: Breath sounds normal. No respiratory distress. He has no wheezes.  Abdominal: Soft. Bowel sounds are normal. He exhibits no distension. There is no tenderness. There is no guarding and no CVA tenderness.  Musculoskeletal: He exhibits no edema.       Right shoulder: He exhibits no swelling and no deformity.  Neurological: He is alert and oriented to person, place, and time. He has normal strength and normal reflexes. No cranial nerve deficit or sensory deficit. Gait normal.  Skin: Skin is warm and dry. No rash noted. No cyanosis. Nails show no clubbing.  Psychiatric: He has a normal mood and affect. His speech is normal and behavior is normal. Judgment and thought content normal. Cognition and memory are normal.    Results for orders placed or performed in visit on 07/30/16  COMPLETE METABOLIC PANEL WITH GFR  Result Value Ref Range   Sodium 139 135 - 146 mmol/L   Potassium 4.5 3.5 - 5.3 mmol/L   Chloride 100 98 - 110 mmol/L   CO2 34 (H) 20 - 31 mmol/L   Glucose, Bld 83 65 - 99 mg/dL   BUN 20 7 - 25 mg/dL   Creat 1.08 0.70 - 1.18 mg/dL   Total Bilirubin 0.4 0.2 - 1.2 mg/dL   Alkaline Phosphatase 56 40 - 115 U/L   AST 17 10 - 35 U/L   ALT 10 9 - 46 U/L   Total Protein 6.5 6.1 - 8.1 g/dL   Albumin 3.7 3.6 - 5.1 g/dL   Calcium 8.9 8.6 - 10.3 mg/dL   GFR, Est African American 76 >=60 mL/min   GFR, Est Non African American 66 >=60 mL/min  Lipid panel   Result Value Ref Range   Cholesterol 99 (L) 125 - 200 mg/dL   Triglycerides 157 (H) <150 mg/dL   HDL 42 >=40 mg/dL   Total CHOL/HDL Ratio 2.4 <=5.0 Ratio   VLDL 31 (H) <30 mg/dL   LDL Cholesterol 26 <130 mg/dL  Hemoglobin A1c  Result Value Ref Range   Hgb A1c MFr Bld 5.9 (H) <5.7 %   Mean Plasma Glucose 123 mg/dL   Complete Blood Count (Most recent): Lab Results  Component Value Date   WBC 12.8 (H) 08/11/2012   HGB  15.9 08/11/2012   HCT 46.9 08/11/2012   MCV 89.8 08/11/2012   PLT 157 08/11/2012   Chemistry (most recent): Lab Results  Component Value Date   NA 139 07/30/2016   K 4.5 07/30/2016   CL 100 07/30/2016   CO2 34 (H) 07/30/2016   BUN 20 07/30/2016   CREATININE 1.08 07/30/2016      Assessment & Plan:   1. Uncontrolled type 2 diabetes mellitus with complication, with long-term current use of insulin ([place]) -His diabetes is  complicated by CAD and CKD and patient remains at a high risk for more acute and chronic complications of diabetes which include CAD, CVA, CKD, retinopathy, and neuropathy. These are all discussed in detail with the patient.  Patient came with near target fasting  glucose profile, and  recent A1c of  5.9% improving from 7.2 %.  Glucose logs and insulin administration records pertaining to this visit,  to be scanned into patient's records.  Recent labs reviewed.   - I have re-counseled the patient on diet management  by adopting a carbohydrate restricted / protein rich  Diet.  - Suggestion is made for patient to avoid simple carbohydrates   from their diet including Cakes , Desserts, Ice Cream,  Soda (  diet and regular) , Sweet Tea , Candies,  Chips, Cookies, Artificial Sweeteners,   and "Sugar-free" Products .  This will help patient to have stable blood glucose profile and potentially avoid unintended  Weight gain.  - Patient is advised to stick to a routine mealtimes to eat 3 meals  a day and avoid unnecessary snacks ( to snack only to  correct hypoglycemia).   - I have approached patient with the following individualized plan to manage diabetes and patient agrees.  -I will lower Levemir  To 20 units QHS.  -Patient is warned not to take insulin without proper monitoring per orders. -Adjustment parameters are given for hypo and hyperglycemia in writing. -Patient is encouraged to call clinic for blood glucose levels less than 70 or above 300 mg /dl. -For better insulin sensitivity I will continue  MTF  1000mg  po BID.   - Patient specific target  for A1c; LDL, HDL, Triglycerides, and  Waist Circumference were discussed in detail.  2) BP/HTN: controlled. Continue current medications includingACEI/ARB. 3) Lipids/HPL:  continue statins. 4)  Weight/Diet: CDE consult in progress, exercise, and carbohydrates information provided.  5) Chronic Care/Health Maintenance:  -Patient is on ACEI/ARB and Statin medications and encouraged to continue to follow up with Ophthalmology, Podiatrist at least yearly or according to recommendations, and advised to quit smoking. I have recommended yearly flu vaccine and pneumonia vaccination at least every 5 years; moderate intensity exercise for up to 150 minutes weekly; and  sleep for at least 7 hours a day.  - 25 minutes of time was spent on the care of this patient , 50% of which was applied for counseling on diabetes complications and their preventions.  - I advised patient to maintain close follow up with HAWKINS,EDWARD L, MD for primary care needs.  Patient is asked to bring meter and  blood glucose logs during their next visit.   Follow up plan: -Return in about 3 months (around 11/05/2016) for follow up with pre-visit labs, meter, and logs.  Glade Lloyd, MD Phone: [phone]  Fax: [phone]   08/06/2016, 10:48 AM

## 2016-08-14 DIAGNOSIS — R69 Illness, unspecified: Secondary | ICD-10-CM | POA: Diagnosis not present

## 2016-08-19 DIAGNOSIS — I10 Essential (primary) hypertension: Secondary | ICD-10-CM | POA: Diagnosis not present

## 2016-08-19 DIAGNOSIS — E119 Type 2 diabetes mellitus without complications: Secondary | ICD-10-CM | POA: Diagnosis not present

## 2016-08-19 DIAGNOSIS — M545 Low back pain: Secondary | ICD-10-CM | POA: Diagnosis not present

## 2016-08-19 DIAGNOSIS — Z23 Encounter for immunization: Secondary | ICD-10-CM | POA: Diagnosis not present

## 2016-08-19 DIAGNOSIS — J441 Chronic obstructive pulmonary disease with (acute) exacerbation: Secondary | ICD-10-CM | POA: Diagnosis not present

## 2016-09-09 DIAGNOSIS — R69 Illness, unspecified: Secondary | ICD-10-CM | POA: Diagnosis not present

## 2016-09-24 ENCOUNTER — Other Ambulatory Visit: Payer: Self-pay

## 2016-09-24 MED ORDER — INSULIN PEN NEEDLE 31G X 8 MM MISC: 5 refills | Status: DC

## 2016-10-15 DIAGNOSIS — E1140 Type 2 diabetes mellitus with diabetic neuropathy, unspecified: Secondary | ICD-10-CM | POA: Diagnosis not present

## 2016-10-15 DIAGNOSIS — E1151 Type 2 diabetes mellitus with diabetic peripheral angiopathy without gangrene: Secondary | ICD-10-CM | POA: Diagnosis not present

## 2016-11-04 ENCOUNTER — Other Ambulatory Visit: Payer: Self-pay | Admitting: "Endocrinology

## 2016-11-04 DIAGNOSIS — Z794 Long term (current) use of insulin: Secondary | ICD-10-CM | POA: Diagnosis not present

## 2016-11-04 DIAGNOSIS — E118 Type 2 diabetes mellitus with unspecified complications: Secondary | ICD-10-CM | POA: Diagnosis not present

## 2016-11-04 DIAGNOSIS — E1165 Type 2 diabetes mellitus with hyperglycemia: Secondary | ICD-10-CM | POA: Diagnosis not present

## 2016-11-04 LAB — COMPLETE METABOLIC PANEL WITH GFR
ALT: 9 U/L (ref 9–46)
AST: 17 U/L (ref 10–35)
Albumin: 4 g/dL (ref 3.6–5.1)
Alkaline Phosphatase: 57 U/L (ref 40–115)
BILIRUBIN TOTAL: 0.7 mg/dL (ref 0.2–1.2)
BUN: 16 mg/dL (ref 7–25)
CO2: 35 mmol/L — AB (ref 20–31)
CREATININE: 0.97 mg/dL (ref 0.70–1.18)
Calcium: 9.5 mg/dL (ref 8.6–10.3)
Chloride: 97 mmol/L — ABNORMAL LOW (ref 98–110)
GFR, EST AFRICAN AMERICAN: 86 mL/min (ref >=60)
GFR, Est Non African American: 74 mL/min (ref >=60)
Glucose, Bld: 102 mg/dL — ABNORMAL HIGH (ref 65–99)
Potassium: 4.6 mmol/L (ref 3.5–5.3)
Sodium: 138 mmol/L (ref 135–146)
TOTAL PROTEIN: 6.7 g/dL (ref 6.1–8.1)

## 2016-11-04 LAB — HEMOGLOBIN A1C
Hgb A1c MFr Bld: 5.4 % (ref <5.7)
MEAN PLASMA GLUCOSE: 108 mg/dL

## 2016-11-13 ENCOUNTER — Ambulatory Visit (INDEPENDENT_AMBULATORY_CARE_PROVIDER_SITE_OTHER): Payer: Medicare HMO | Admitting: "Endocrinology

## 2016-11-13 ENCOUNTER — Encounter: Payer: Self-pay | Admitting: "Endocrinology

## 2016-11-13 VITALS — BP 130/62 | HR 60 | Resp 18 | Ht 72.0 in | Wt 188.0 lb

## 2016-11-13 DIAGNOSIS — Z794 Long term (current) use of insulin: Secondary | ICD-10-CM | POA: Diagnosis not present

## 2016-11-13 DIAGNOSIS — I10 Essential (primary) hypertension: Secondary | ICD-10-CM | POA: Diagnosis not present

## 2016-11-13 DIAGNOSIS — E118 Type 2 diabetes mellitus with unspecified complications: Secondary | ICD-10-CM | POA: Diagnosis not present

## 2016-11-13 DIAGNOSIS — E1165 Type 2 diabetes mellitus with hyperglycemia: Secondary | ICD-10-CM | POA: Diagnosis not present

## 2016-11-13 DIAGNOSIS — IMO0002 Reserved for concepts with insufficient information to code with codable children: Secondary | ICD-10-CM

## 2016-11-13 DIAGNOSIS — E782 Mixed hyperlipidemia: Secondary | ICD-10-CM | POA: Diagnosis not present

## 2016-11-13 NOTE — Progress Notes (Signed)
Subjective:    Patient ID: Joshua Fletcher, male    DOB: Sep 04, 1936, PCP Alonza Bogus, MD   Past Medical History:  Diagnosis Date  . Constipation   . Coronary artery disease   . Diabetes mellitus   . Hypertension    Past Surgical History:  Procedure Laterality Date  . APPENDECTOMY  1958  . BACK SURGERY    . CORONARY STENT PLACEMENT     Social History   Social History  . Marital status: Married    Spouse name: N/A  . Number of children: N/A  . Years of education: N/A   Social History Main Topics  . Smoking status: Current Every Day Smoker    Packs/day: 0.50    Types: Cigarettes  . Smokeless tobacco: Never Used  . Alcohol use No  . Drug use: No  . Sexual activity: Not Asked   Other Topics Concern  . None   Social History Narrative  . None   Outpatient Encounter Prescriptions as of 11/13/2016  Medication Sig  . gabapentin (NEURONTIN) 300 MG capsule Take 300 mg by mouth at bedtime.  Marland Kitchen glucose blood test strip Four times daily testing Dx: E11.8  . Insulin Pen Needle (B-D ULTRAFINE III SHORT PEN) 31G X 8 MM MISC USE ONE  AS DIRECTED  . lisinopril (PRINIVIL,ZESTRIL) 10 MG tablet Take 10 mg by mouth daily.  . magnesium citrate solution Take 296 mLs by mouth once.  . metFORMIN (GLUCOPHAGE) 1000 MG tablet Take 1,000 mg by mouth 2 (two) times daily with a meal.  . metoprolol (LOPRESSOR) 50 MG tablet Take 50 mg by mouth 2 (two) times daily.  . polyethylene glycol powder (GLYCOLAX/MIRALAX) powder Take 17 g by mouth daily.  . simvastatin (ZOCOR) 40 MG tablet Take 40 mg by mouth daily.  . tamsulosin (FLOMAX) 0.4 MG CAPS capsule Take 0.4 mg by mouth.  . [DISCONTINUED] Insulin Detemir (LEVEMIR FLEXTOUCH) 100 UNIT/ML Pen Inject 20 Units into the skin at bedtime.   No facility-administered encounter medications on file as of 11/13/2016.    ALLERGIES: No Known Allergies VACCINATION STATUS:  There is no immunization history on file for this patient.  Diabetes   Pertinent negatives for hypoglycemia include no confusion, headaches, pallor or seizures. Pertinent negatives for diabetes include no chest pain, no fatigue, no polydipsia, no polyphagia, no polyuria and no weakness.   He was diagnosed with  type 2 DM x 17 years. He is here for f/u for same. He did monitor BG at fasting, getting better and near target.  he is now consistent in taking his medications. He is on Levemir 20 units , and MTF 1000gm po BID. His recent A1c has  improved to 5.4% from  8.5%. no hypoglycemia. He has reported complications including CAD which required stent placement, CHF and likely retinopathy. Pt is also being treated for neuropathy, pain syndrome, HTN and HPL. he continues to smoke 1PPD and he is not ready to quit. no other interval problems reported.  Review of Systems  Constitutional: Negative for fatigue and unexpected weight change.  HENT: Negative for dental problem, mouth sores and trouble swallowing.   Eyes: Negative for visual disturbance.  Respiratory: Negative for cough, choking, chest tightness, shortness of breath and wheezing.   Cardiovascular: Negative for chest pain, palpitations and leg swelling.  Gastrointestinal: Negative for abdominal distention, abdominal pain, constipation, diarrhea, nausea and vomiting.  Endocrine: Negative for polydipsia, polyphagia and polyuria.  Genitourinary: Negative for dysuria, flank pain, hematuria and urgency.  Musculoskeletal: Negative for back pain, gait problem, myalgias and neck pain.  Skin: Negative for pallor, rash and wound.  Neurological: Negative for seizures, syncope, weakness, numbness and headaches.  Psychiatric/Behavioral: Negative.  Negative for confusion and dysphoric mood.    Objective:    BP 130/62   Pulse 60   Resp 18   Ht 6' (1.829 m)   Wt 188 lb 0.6 oz (85.3 kg)   SpO2 94%   BMI 25.50 kg/m   Wt Readings from Last 3 Encounters:  11/13/16 188 lb 0.6 oz (85.3 kg)  08/06/16 200 lb (90.7 kg)   05/06/16 204 lb (92.5 kg)    Physical Exam  Constitutional: He is oriented to person, place, and time. He appears well-developed and well-nourished. He is cooperative. No distress.  HENT:  Head: Normocephalic and atraumatic.  Eyes: EOM are normal.  Neck: Normal range of motion. Neck supple. No tracheal deviation present. No thyromegaly present.  Cardiovascular: Normal rate, S1 normal, S2 normal and normal heart sounds.  Exam reveals no gallop.   No murmur heard. Pulses:      Dorsalis pedis pulses are 1+ on the right side, and 1+ on the left side.       Posterior tibial pulses are 1+ on the right side, and 1+ on the left side.  Pulmonary/Chest: Breath sounds normal. No respiratory distress. He has no wheezes.  Abdominal: Soft. Bowel sounds are normal. He exhibits no distension. There is no tenderness. There is no guarding and no CVA tenderness.  Musculoskeletal: He exhibits no edema.       Right shoulder: He exhibits no swelling and no deformity.  Neurological: He is alert and oriented to person, place, and time. He has normal strength and normal reflexes. No cranial nerve deficit or sensory deficit. Gait normal.  Skin: Skin is warm and dry. No rash noted. No cyanosis. Nails show no clubbing.  Psychiatric: He has a normal mood and affect. His speech is normal and behavior is normal. Judgment and thought content normal. Cognition and memory are normal.    Results for orders placed or performed in visit on 11/04/16  COMPLETE METABOLIC PANEL WITH GFR  Result Value Ref Range   Sodium 138 135 - 146 mmol/L   Potassium 4.6 3.5 - 5.3 mmol/L   Chloride 97 (L) 98 - 110 mmol/L   CO2 35 (H) 20 - 31 mmol/L   Glucose, Bld 102 (H) 65 - 99 mg/dL   BUN 16 7 - 25 mg/dL   Creat 0.97 0.70 - 1.18 mg/dL   Total Bilirubin 0.7 0.2 - 1.2 mg/dL   Alkaline Phosphatase 57 40 - 115 U/L   AST 17 10 - 35 U/L   ALT 9 9 - 46 U/L   Total Protein 6.7 6.1 - 8.1 g/dL   Albumin 4.0 3.6 - 5.1 g/dL   Calcium 9.5 8.6  - 10.3 mg/dL   GFR, Est African American 86 >=60 mL/min   GFR, Est Non African American 74 >=60 mL/min  Hemoglobin A1c  Result Value Ref Range   Hgb A1c MFr Bld 5.4 <5.7 %   Mean Plasma Glucose 108 mg/dL   Complete Blood Count (Most recent): Lab Results  Component Value Date   WBC 12.8 (H) 08/11/2012   HGB 15.9 08/11/2012   HCT 46.9 08/11/2012   MCV 89.8 08/11/2012   PLT 157 08/11/2012   Chemistry (most recent): Lab Results  Component Value Date   NA 138 11/04/2016   K 4.6 11/04/2016  CL 97 (L) 11/04/2016   CO2 35 (H) 11/04/2016   BUN 16 11/04/2016   CREATININE 0.97 11/04/2016      Assessment & Plan:   1. Uncontrolled type 2 diabetes mellitus with complication, with long-term current use of insulin ([place]) -His diabetes is  complicated by CAD and CKD and patient remains at a high risk for more acute and chronic complications of diabetes which include CAD, CVA, CKD, retinopathy, and neuropathy. These are all discussed in detail with the patient.  Patient came with tightly controlled fasting  glucose profile, and  recent A1c of  5.4% improving from 7.2 %.  Glucose logs and insulin administration records pertaining to this visit,  to be scanned into patient's records.  Recent labs reviewed.   - I have re-counseled the patient on diet management  by adopting a carbohydrate restricted / protein rich  Diet.  - Suggestion is made for patient to avoid simple carbohydrates   from their diet including Cakes , Desserts, Ice Cream,  Soda (  diet and regular) , Sweet Tea , Candies,  Chips, Cookies, Artificial Sweeteners,   and "Sugar-free" Products .  This will help patient to have stable blood glucose profile and potentially avoid unintended  Weight gain.  - Patient is advised to stick to a routine mealtimes to eat 3 meals  a day and avoid unnecessary snacks ( to snack only to correct hypoglycemia).   - I have approached patient with the following individualized plan to manage  diabetes and patient agrees.  -I advised him to hold Levemir for now , he will continue to monitor blood glucose daily at fasting. He is advised to call me if he registers above 200 3 x a week.  - If he starts to have fasting blood glucose above 200 he will be resumed on 10 units of Levemir daily at bedtime.  -For better insulin sensitivity I will continue  MTF  1000mg  po BID.  - Patient specific target  for A1c; LDL, HDL, Triglycerides, and  Waist Circumference were discussed in detail.  2) BP/HTN: controlled. Continue current medications includingACEI/ARB. 3) Lipids/HPL:  continue statins. 4)  Weight/Diet:   he has lost 17 pounds since last year, mainly due to dietary changes (he says he stopped soda) , CDE consult in progress, exercise, and carbohydrates information provided.  5) Chronic Care/Health Maintenance:  -Patient is on ACEI/ARB and Statin medications and encouraged to continue to follow up with Ophthalmology, Podiatrist at least yearly or according to recommendations, and advised to quit smoking. I have recommended yearly flu vaccine and pneumonia vaccination at least every 5 years; moderate intensity exercise for up to 150 minutes weekly; and  sleep for at least 7 hours a day.  - 25 minutes of time was spent on the care of this patient , 50% of which was applied for counseling on diabetes complications and their preventions.  - I advised patient to maintain close follow up with HAWKINS,EDWARD L, MD for primary care needs.  Patient is asked to bring meter and  blood glucose logs during their next visit.   Follow up plan: -Return for follow up with pre-visit labs, meter, and logs.  Glade Lloyd, MD Phone: [phone]  Fax: [phone]   11/13/2016, 10:57 AM

## 2016-11-13 NOTE — Patient Instructions (Signed)

## 2016-11-21 DIAGNOSIS — J449 Chronic obstructive pulmonary disease, unspecified: Secondary | ICD-10-CM | POA: Diagnosis not present

## 2016-11-21 DIAGNOSIS — I2510 Atherosclerotic heart disease of native coronary artery without angina pectoris: Secondary | ICD-10-CM | POA: Diagnosis not present

## 2016-11-21 DIAGNOSIS — M545 Low back pain: Secondary | ICD-10-CM | POA: Diagnosis not present

## 2016-11-21 DIAGNOSIS — I10 Essential (primary) hypertension: Secondary | ICD-10-CM | POA: Diagnosis not present

## 2016-12-24 DIAGNOSIS — E1151 Type 2 diabetes mellitus with diabetic peripheral angiopathy without gangrene: Secondary | ICD-10-CM | POA: Diagnosis not present

## 2016-12-24 DIAGNOSIS — E1140 Type 2 diabetes mellitus with diabetic neuropathy, unspecified: Secondary | ICD-10-CM | POA: Diagnosis not present

## 2017-01-09 DIAGNOSIS — R69 Illness, unspecified: Secondary | ICD-10-CM | POA: Diagnosis not present

## 2017-02-04 ENCOUNTER — Other Ambulatory Visit: Payer: Self-pay | Admitting: "Endocrinology

## 2017-02-04 DIAGNOSIS — E1165 Type 2 diabetes mellitus with hyperglycemia: Secondary | ICD-10-CM | POA: Diagnosis not present

## 2017-02-04 DIAGNOSIS — E118 Type 2 diabetes mellitus with unspecified complications: Secondary | ICD-10-CM | POA: Diagnosis not present

## 2017-02-04 DIAGNOSIS — Z794 Long term (current) use of insulin: Secondary | ICD-10-CM | POA: Diagnosis not present

## 2017-02-05 LAB — COMPREHENSIVE METABOLIC PANEL
ALBUMIN: 3.7 g/dL (ref 3.6–5.1)
ALK PHOS: 72 U/L (ref 40–115)
ALT: 11 U/L (ref 9–46)
AST: 17 U/L (ref 10–35)
BUN: 24 mg/dL (ref 7–25)
CALCIUM: 9.1 mg/dL (ref 8.6–10.3)
CO2: 30 mmol/L (ref 20–31)
Chloride: 94 mmol/L — ABNORMAL LOW (ref 98–110)
Creat: 1.06 mg/dL (ref 0.70–1.18)
Glucose, Bld: 146 mg/dL — ABNORMAL HIGH (ref 65–99)
POTASSIUM: 4.5 mmol/L (ref 3.5–5.3)
Sodium: 137 mmol/L (ref 135–146)
TOTAL PROTEIN: 6.7 g/dL (ref 6.1–8.1)
Total Bilirubin: 0.7 mg/dL (ref 0.2–1.2)

## 2017-02-05 LAB — HEMOGLOBIN A1C
HEMOGLOBIN A1C: 5.8 % — AB (ref <5.7)
MEAN PLASMA GLUCOSE: 120 mg/dL

## 2017-02-11 DIAGNOSIS — R234 Changes in skin texture: Secondary | ICD-10-CM | POA: Diagnosis not present

## 2017-02-11 DIAGNOSIS — D485 Neoplasm of uncertain behavior of skin: Secondary | ICD-10-CM | POA: Diagnosis not present

## 2017-02-11 DIAGNOSIS — Z85828 Personal history of other malignant neoplasm of skin: Secondary | ICD-10-CM | POA: Diagnosis not present

## 2017-02-11 DIAGNOSIS — D0439 Carcinoma in situ of skin of other parts of face: Secondary | ICD-10-CM | POA: Diagnosis not present

## 2017-02-13 ENCOUNTER — Encounter: Payer: Self-pay | Admitting: "Endocrinology

## 2017-02-13 ENCOUNTER — Ambulatory Visit (INDEPENDENT_AMBULATORY_CARE_PROVIDER_SITE_OTHER): Payer: Medicare HMO | Admitting: "Endocrinology

## 2017-02-13 VITALS — BP 140/89 | HR 87 | Ht 72.0 in | Wt 185.0 lb

## 2017-02-13 DIAGNOSIS — E1165 Type 2 diabetes mellitus with hyperglycemia: Secondary | ICD-10-CM

## 2017-02-13 DIAGNOSIS — I10 Essential (primary) hypertension: Secondary | ICD-10-CM

## 2017-02-13 DIAGNOSIS — Z794 Long term (current) use of insulin: Secondary | ICD-10-CM | POA: Diagnosis not present

## 2017-02-13 DIAGNOSIS — E782 Mixed hyperlipidemia: Secondary | ICD-10-CM | POA: Diagnosis not present

## 2017-02-13 DIAGNOSIS — C439 Malignant melanoma of skin, unspecified: Secondary | ICD-10-CM | POA: Insufficient documentation

## 2017-02-13 DIAGNOSIS — IMO0002 Reserved for concepts with insufficient information to code with codable children: Secondary | ICD-10-CM

## 2017-02-13 DIAGNOSIS — E118 Type 2 diabetes mellitus with unspecified complications: Secondary | ICD-10-CM | POA: Diagnosis not present

## 2017-02-13 NOTE — Progress Notes (Signed)
Subjective:    Patient ID: Joshua Fletcher, male    DOB: January 24, 1936, PCP Alonza Bogus, MD   Past Medical History:  Diagnosis Date  . Constipation   . Coronary artery disease   . Diabetes mellitus   . Hypertension    Past Surgical History:  Procedure Laterality Date  . APPENDECTOMY  1958  . BACK SURGERY    . CORONARY STENT PLACEMENT     Social History   Social History  . Marital status: Married    Spouse name: N/A  . Number of children: N/A  . Years of education: N/A   Social History Main Topics  . Smoking status: Current Every Day Smoker    Packs/day: 0.50    Types: Cigarettes  . Smokeless tobacco: Never Used  . Alcohol use No  . Drug use: No  . Sexual activity: Not Asked   Other Topics Concern  . None   Social History Narrative  . None   Outpatient Encounter Prescriptions as of 02/13/2017  Medication Sig  . gabapentin (NEURONTIN) 300 MG capsule Take 300 mg by mouth at bedtime.  Marland Kitchen glucose blood test strip Four times daily testing Dx: E11.8  . Insulin Pen Needle (B-D ULTRAFINE III SHORT PEN) 31G X 8 MM MISC USE ONE  AS DIRECTED  . lisinopril (PRINIVIL,ZESTRIL) 10 MG tablet Take 10 mg by mouth daily.  . magnesium citrate solution Take 296 mLs by mouth once.  . metFORMIN (GLUCOPHAGE) 1000 MG tablet Take 1,000 mg by mouth 2 (two) times daily with a meal.  . metoprolol (LOPRESSOR) 50 MG tablet Take 50 mg by mouth 2 (two) times daily.  . polyethylene glycol powder (GLYCOLAX/MIRALAX) powder Take 17 g by mouth daily.  . simvastatin (ZOCOR) 40 MG tablet Take 40 mg by mouth daily.  . tamsulosin (FLOMAX) 0.4 MG CAPS capsule Take 0.4 mg by mouth.   No facility-administered encounter medications on file as of 02/13/2017.    ALLERGIES: No Known Allergies VACCINATION STATUS:  There is no immunization history on file for this patient.  Diabetes  He presents for his follow-up diabetic visit. He has type 2 diabetes mellitus. His disease course has been stable.  Pertinent negatives for hypoglycemia include no confusion, headaches, pallor or seizures. Pertinent negatives for diabetes include no chest pain, no fatigue, no polydipsia, no polyphagia, no polyuria and no weakness. Symptoms are stable.   He was diagnosed with  type 2 DM x 17 years. He is here for f/u for same. He did monitor BG at fasting, getting better and near target.  he is now consistent in taking his medications. He is on Levemir 20 units , and MTF 1000gm po BID. His recent A1c has  improved to 5.8% from  8.5%. no hypoglycemia. He has reported complications including CAD which required stent placement, CHF and likely retinopathy. Pt is also being treated for neuropathy, pain syndrome, HTN and HPL. he continues to smoke 1PPD and he is not ready to quit. no other interval problems reported.  Review of Systems  Constitutional: Negative for fatigue and unexpected weight change.  HENT: Negative for dental problem, mouth sores and trouble swallowing.   Eyes: Negative for visual disturbance.  Respiratory: Negative for cough, choking, chest tightness, shortness of breath and wheezing.   Cardiovascular: Negative for chest pain, palpitations and leg swelling.  Gastrointestinal: Negative for abdominal distention, abdominal pain, constipation, diarrhea, nausea and vomiting.  Endocrine: Negative for polydipsia, polyphagia and polyuria.  Genitourinary: Negative for dysuria, flank  pain, hematuria and urgency.  Musculoskeletal: Negative for back pain, gait problem, myalgias and neck pain.  Skin: Negative for pallor, rash and wound.  Neurological: Negative for seizures, syncope, weakness, numbness and headaches.  Psychiatric/Behavioral: Negative.  Negative for confusion and dysphoric mood.    Objective:    BP 140/89   Pulse 87   Ht 6' (1.829 m)   Wt 185 lb (83.9 kg)   BMI 25.09 kg/m   Wt Readings from Last 3 Encounters:  02/13/17 185 lb (83.9 kg)  11/13/16 188 lb 0.6 oz (85.3 kg)  08/06/16  200 lb (90.7 kg)    Physical Exam  Constitutional: He is oriented to person, place, and time. He appears well-developed and well-nourished. He is cooperative. No distress.  HENT:  Head: Normocephalic and atraumatic.  Eyes: EOM are normal.  Neck: Normal range of motion. Neck supple. No tracheal deviation present. No thyromegaly present.  Cardiovascular: Normal rate, S1 normal, S2 normal and normal heart sounds.  Exam reveals no gallop.   No murmur heard. Pulses:      Dorsalis pedis pulses are 1+ on the right side, and 1+ on the left side.       Posterior tibial pulses are 1+ on the right side, and 1+ on the left side.  Pulmonary/Chest: Breath sounds normal. No respiratory distress. He has no wheezes.  Abdominal: Soft. Bowel sounds are normal. He exhibits no distension. There is no tenderness. There is no guarding and no CVA tenderness.  Musculoskeletal: He exhibits no edema.       Right shoulder: He exhibits no swelling and no deformity.  Neurological: He is alert and oriented to person, place, and time. He has normal strength and normal reflexes. No cranial nerve deficit or sensory deficit. Gait normal.  Skin: Skin is warm and dry. No rash noted. No cyanosis. Nails show no clubbing.  Psychiatric: He has a normal mood and affect. His speech is normal and behavior is normal. Judgment and thought content normal. Cognition and memory are normal.    Results for orders placed or performed in visit on 02/04/17  Comprehensive metabolic panel  Result Value Ref Range   Sodium 137 135 - 146 mmol/L   Potassium 4.5 3.5 - 5.3 mmol/L   Chloride 94 (L) 98 - 110 mmol/L   CO2 30 20 - 31 mmol/L   Glucose, Bld 146 (H) 65 - 99 mg/dL   BUN 24 7 - 25 mg/dL   Creat 1.06 0.70 - 1.18 mg/dL   Total Bilirubin 0.7 0.2 - 1.2 mg/dL   Alkaline Phosphatase 72 40 - 115 U/L   AST 17 10 - 35 U/L   ALT 11 9 - 46 U/L   Total Protein 6.7 6.1 - 8.1 g/dL   Albumin 3.7 3.6 - 5.1 g/dL   Calcium 9.1 8.6 - 10.3 mg/dL   Hemoglobin A1c  Result Value Ref Range   Hgb A1c MFr Bld 5.8 (H) <5.7 %   Mean Plasma Glucose 120 mg/dL   Complete Blood Count (Most recent): Lab Results  Component Value Date   WBC 12.8 (H) 08/11/2012   HGB 15.9 08/11/2012   HCT 46.9 08/11/2012   MCV 89.8 08/11/2012   PLT 157 08/11/2012   Chemistry (most recent): Lab Results  Component Value Date   NA 137 02/04/2017   K 4.5 02/04/2017   CL 94 (L) 02/04/2017   CO2 30 02/04/2017   BUN 24 02/04/2017   CREATININE 1.06 02/04/2017      Assessment &  Plan:   1. Uncontrolled type 2 diabetes mellitus with complication, with long-term current use of insulin ([place]) -His diabetes is  complicated by CAD and CKD and patient remains at a high risk for more acute and chronic complications of diabetes which include CAD, CVA, CKD, retinopathy, and neuropathy. These are all discussed in detail with the patient.  Patient came with tightly controlled fasting  glucose profile, and  recent A1c of  5.8% improving from 7.2 %.  Glucose logs and insulin administration records pertaining to this visit,  to be scanned into patient's records.  Recent labs reviewed.   - I have re-counseled the patient on diet management  by adopting a carbohydrate restricted / protein rich  Diet.  - Suggestion is made for patient to avoid simple carbohydrates   from their diet including Cakes , Desserts, Ice Cream,  Soda (  diet and regular) , Sweet Tea , Candies,  Chips, Cookies, Artificial Sweeteners,   and "Sugar-free" Products .  This will help patient to have stable blood glucose profile and potentially avoid unintended  Weight gain.  - Patient is advised to stick to a routine mealtimes to eat 3 meals  a day and avoid unnecessary snacks ( to snack only to correct hypoglycemia).   - I have approached patient with the following individualized plan to manage diabetes and patient agrees.  -I advised him to Stay off of insulin.   - If he starts to have fasting blood  glucose above 200 he will be resumed on 10 units of Levemir daily at bedtime.  - I will continue  MTF  1000mg  po BID.  - Patient specific target  for A1c; LDL, HDL, Triglycerides, and  Waist Circumference were discussed in detail.  2) BP/HTN: controlled. Continue current medications includingACEI/ARB. 3) Lipids/HPL:  continue statins. 4)  Weight/Diet:   he has lost 20 pounds since last year, mainly due to dietary changes (he says he stopped soda) , CDE consult in progress, exercise, and carbohydrates information provided.  5) Chronic Care/Health Maintenance:  -Patient is on ACEI/ARB and Statin medications and encouraged to continue to follow up with Ophthalmology, Podiatrist at least yearly or according to recommendations, and advised to quit smoking. I have recommended yearly flu vaccine and pneumonia vaccination at least every 5 years; moderate intensity exercise for up to 150 minutes weekly; and  sleep for at least 7 hours a day.  - 25 minutes of time was spent on the care of this patient , 50% of which was applied for counseling on diabetes complications and their preventions.  - I advised patient to maintain close follow up with HAWKINS,EDWARD L, MD for primary care needs.  Patient is asked to bring meter and  blood glucose logs during their next visit.   Follow up plan: -Return in about 6 months (around 08/16/2017) for follow up with pre-visit labs.  Glade Lloyd, MD Phone: [phone]  Fax: [phone]   02/13/2017, 11:01 AM

## 2017-02-13 NOTE — Patient Instructions (Signed)

## 2017-02-19 DIAGNOSIS — J449 Chronic obstructive pulmonary disease, unspecified: Secondary | ICD-10-CM | POA: Diagnosis not present

## 2017-02-19 DIAGNOSIS — I10 Essential (primary) hypertension: Secondary | ICD-10-CM | POA: Diagnosis not present

## 2017-02-19 DIAGNOSIS — I2510 Atherosclerotic heart disease of native coronary artery without angina pectoris: Secondary | ICD-10-CM | POA: Diagnosis not present

## 2017-02-19 DIAGNOSIS — M545 Low back pain: Secondary | ICD-10-CM | POA: Diagnosis not present

## 2017-02-27 DIAGNOSIS — D2239 Melanocytic nevi of other parts of face: Secondary | ICD-10-CM | POA: Diagnosis not present

## 2017-02-27 DIAGNOSIS — D485 Neoplasm of uncertain behavior of skin: Secondary | ICD-10-CM | POA: Diagnosis not present

## 2017-02-27 DIAGNOSIS — C44329 Squamous cell carcinoma of skin of other parts of face: Secondary | ICD-10-CM | POA: Diagnosis not present

## 2017-02-27 DIAGNOSIS — L118 Other specified acantholytic disorders: Secondary | ICD-10-CM | POA: Diagnosis not present

## 2017-02-27 DIAGNOSIS — L570 Actinic keratosis: Secondary | ICD-10-CM | POA: Diagnosis not present

## 2017-03-04 DIAGNOSIS — L0100 Impetigo, unspecified: Secondary | ICD-10-CM | POA: Diagnosis not present

## 2017-03-04 DIAGNOSIS — E1140 Type 2 diabetes mellitus with diabetic neuropathy, unspecified: Secondary | ICD-10-CM | POA: Diagnosis not present

## 2017-03-04 DIAGNOSIS — E1151 Type 2 diabetes mellitus with diabetic peripheral angiopathy without gangrene: Secondary | ICD-10-CM | POA: Diagnosis not present

## 2017-05-01 DIAGNOSIS — D0439 Carcinoma in situ of skin of other parts of face: Secondary | ICD-10-CM | POA: Diagnosis not present

## 2017-05-01 DIAGNOSIS — C44321 Squamous cell carcinoma of skin of nose: Secondary | ICD-10-CM | POA: Diagnosis not present

## 2017-05-01 DIAGNOSIS — L570 Actinic keratosis: Secondary | ICD-10-CM | POA: Diagnosis not present

## 2017-05-01 DIAGNOSIS — C44329 Squamous cell carcinoma of skin of other parts of face: Secondary | ICD-10-CM | POA: Diagnosis not present

## 2017-05-01 DIAGNOSIS — D485 Neoplasm of uncertain behavior of skin: Secondary | ICD-10-CM | POA: Diagnosis not present

## 2017-05-14 DIAGNOSIS — I10 Essential (primary) hypertension: Secondary | ICD-10-CM | POA: Diagnosis not present

## 2017-05-14 DIAGNOSIS — J449 Chronic obstructive pulmonary disease, unspecified: Secondary | ICD-10-CM | POA: Diagnosis not present

## 2017-05-14 DIAGNOSIS — I2510 Atherosclerotic heart disease of native coronary artery without angina pectoris: Secondary | ICD-10-CM | POA: Diagnosis not present

## 2017-05-14 DIAGNOSIS — M545 Low back pain: Secondary | ICD-10-CM | POA: Diagnosis not present

## 2017-06-03 DIAGNOSIS — E1140 Type 2 diabetes mellitus with diabetic neuropathy, unspecified: Secondary | ICD-10-CM | POA: Diagnosis not present

## 2017-06-03 DIAGNOSIS — E1151 Type 2 diabetes mellitus with diabetic peripheral angiopathy without gangrene: Secondary | ICD-10-CM | POA: Diagnosis not present

## 2017-07-10 DIAGNOSIS — Z85828 Personal history of other malignant neoplasm of skin: Secondary | ICD-10-CM | POA: Diagnosis not present

## 2017-07-10 DIAGNOSIS — L570 Actinic keratosis: Secondary | ICD-10-CM | POA: Diagnosis not present

## 2017-07-10 DIAGNOSIS — D0439 Carcinoma in situ of skin of other parts of face: Secondary | ICD-10-CM | POA: Diagnosis not present

## 2017-07-10 DIAGNOSIS — D485 Neoplasm of uncertain behavior of skin: Secondary | ICD-10-CM | POA: Diagnosis not present

## 2017-07-15 DIAGNOSIS — Z961 Presence of intraocular lens: Secondary | ICD-10-CM | POA: Diagnosis not present

## 2017-07-15 DIAGNOSIS — Z9849 Cataract extraction status, unspecified eye: Secondary | ICD-10-CM | POA: Diagnosis not present

## 2017-07-15 DIAGNOSIS — E119 Type 2 diabetes mellitus without complications: Secondary | ICD-10-CM | POA: Diagnosis not present

## 2017-07-15 DIAGNOSIS — E113293 Type 2 diabetes mellitus with mild nonproliferative diabetic retinopathy without macular edema, bilateral: Secondary | ICD-10-CM | POA: Diagnosis not present

## 2017-07-24 DIAGNOSIS — C44329 Squamous cell carcinoma of skin of other parts of face: Secondary | ICD-10-CM | POA: Diagnosis not present

## 2017-07-26 HISTORY — PX: SKIN CANCER EXCISION: SHX779

## 2017-08-07 DIAGNOSIS — C44329 Squamous cell carcinoma of skin of other parts of face: Secondary | ICD-10-CM | POA: Diagnosis not present

## 2017-08-12 DIAGNOSIS — E118 Type 2 diabetes mellitus with unspecified complications: Secondary | ICD-10-CM | POA: Diagnosis not present

## 2017-08-12 DIAGNOSIS — Z794 Long term (current) use of insulin: Secondary | ICD-10-CM | POA: Diagnosis not present

## 2017-08-12 DIAGNOSIS — E1165 Type 2 diabetes mellitus with hyperglycemia: Secondary | ICD-10-CM | POA: Diagnosis not present

## 2017-08-13 LAB — COMPLETE METABOLIC PANEL WITH GFR
AG Ratio: 1.5 (calc) (ref 1.0–2.5)
ALT: 8 U/L — AB (ref 9–46)
AST: 15 U/L (ref 10–35)
Albumin: 3.8 g/dL (ref 3.6–5.1)
Alkaline phosphatase (APISO): 79 U/L (ref 40–115)
BILIRUBIN TOTAL: 0.4 mg/dL (ref 0.2–1.2)
BUN: 21 mg/dL (ref 7–25)
CHLORIDE: 95 mmol/L — AB (ref 98–110)
CO2: 33 mmol/L — ABNORMAL HIGH (ref 20–32)
Calcium: 8.9 mg/dL (ref 8.6–10.3)
Creat: 0.97 mg/dL (ref 0.70–1.18)
GFR, EST AFRICAN AMERICAN: 86 mL/min/{1.73_m2} (ref >=60)
GFR, Est Non African American: 74 mL/min/{1.73_m2} (ref >=60)
GLUCOSE: 218 mg/dL — AB (ref 65–139)
Globulin: 2.6 g/dL (calc) (ref 1.9–3.7)
Potassium: 4.1 mmol/L (ref 3.5–5.3)
Sodium: 135 mmol/L (ref 135–146)
TOTAL PROTEIN: 6.4 g/dL (ref 6.1–8.1)

## 2017-08-13 LAB — HEMOGLOBIN A1C
EAG (MMOL/L): 7 (calc)
Hgb A1c MFr Bld: 6 % of total Hgb — ABNORMAL HIGH (ref <5.7)
Mean Plasma Glucose: 126 (calc)

## 2017-08-14 DIAGNOSIS — I259 Chronic ischemic heart disease, unspecified: Secondary | ICD-10-CM | POA: Diagnosis not present

## 2017-08-14 DIAGNOSIS — Z23 Encounter for immunization: Secondary | ICD-10-CM | POA: Diagnosis not present

## 2017-08-14 DIAGNOSIS — J449 Chronic obstructive pulmonary disease, unspecified: Secondary | ICD-10-CM | POA: Diagnosis not present

## 2017-08-14 DIAGNOSIS — I10 Essential (primary) hypertension: Secondary | ICD-10-CM | POA: Diagnosis not present

## 2017-08-14 DIAGNOSIS — E119 Type 2 diabetes mellitus without complications: Secondary | ICD-10-CM | POA: Diagnosis not present

## 2017-08-14 DIAGNOSIS — I2510 Atherosclerotic heart disease of native coronary artery without angina pectoris: Secondary | ICD-10-CM | POA: Diagnosis not present

## 2017-08-14 DIAGNOSIS — Z79891 Long term (current) use of opiate analgesic: Secondary | ICD-10-CM | POA: Diagnosis not present

## 2017-08-18 ENCOUNTER — Encounter: Payer: Self-pay | Admitting: "Endocrinology

## 2017-08-18 ENCOUNTER — Ambulatory Visit (INDEPENDENT_AMBULATORY_CARE_PROVIDER_SITE_OTHER): Payer: Medicare HMO | Admitting: "Endocrinology

## 2017-08-18 VITALS — BP 136/80 | HR 74 | Ht 72.0 in | Wt 182.0 lb

## 2017-08-18 DIAGNOSIS — E782 Mixed hyperlipidemia: Secondary | ICD-10-CM

## 2017-08-18 DIAGNOSIS — E118 Type 2 diabetes mellitus with unspecified complications: Secondary | ICD-10-CM | POA: Diagnosis not present

## 2017-08-18 DIAGNOSIS — I10 Essential (primary) hypertension: Secondary | ICD-10-CM

## 2017-08-18 DIAGNOSIS — IMO0002 Reserved for concepts with insufficient information to code with codable children: Secondary | ICD-10-CM

## 2017-08-18 DIAGNOSIS — E1165 Type 2 diabetes mellitus with hyperglycemia: Secondary | ICD-10-CM

## 2017-08-18 DIAGNOSIS — Z794 Long term (current) use of insulin: Secondary | ICD-10-CM | POA: Diagnosis not present

## 2017-08-18 MED ORDER — METFORMIN HCL 1000 MG PO TABS: 1000.0000 mg | ORAL_TABLET | Freq: Two times a day (BID) | ORAL | 0 refills | Status: DC

## 2017-08-18 NOTE — Progress Notes (Signed)
Subjective:    Patient ID: Joshua Fletcher, male    DOB: May 06, 1936, PCP Sinda Du, MD   Past Medical History:  Diagnosis Date  . Constipation   . Coronary artery disease   . Diabetes mellitus   . Hypertension    Past Surgical History:  Procedure Laterality Date  . APPENDECTOMY  1958  . BACK SURGERY    . CORONARY STENT PLACEMENT     Social History   Social History  . Marital status: Married    Spouse name: N/A  . Number of children: N/A  . Years of education: N/A   Social History Main Topics  . Smoking status: Current Every Day Smoker    Packs/day: 0.50    Types: Cigarettes  . Smokeless tobacco: Never Used  . Alcohol use No  . Drug use: No  . Sexual activity: Not on file   Other Topics Concern  . Not on file   Social History Narrative  . No narrative on file   Outpatient Encounter Prescriptions as of 08/18/2017  Medication Sig  . gabapentin (NEURONTIN) 300 MG capsule Take 300 mg by mouth at bedtime.  Marland Kitchen glucose blood test strip Four times daily testing Dx: E11.8  . Insulin Pen Needle (B-D ULTRAFINE III SHORT PEN) 31G X 8 MM MISC USE ONE  AS DIRECTED  . lisinopril (PRINIVIL,ZESTRIL) 10 MG tablet Take 10 mg by mouth daily.  . magnesium citrate solution Take 296 mLs by mouth once.  . metFORMIN (GLUCOPHAGE) 1000 MG tablet Take 1 tablet (1,000 mg total) by mouth 2 (two) times daily with a meal.  . metoprolol (LOPRESSOR) 50 MG tablet Take 50 mg by mouth 2 (two) times daily.  . polyethylene glycol powder (GLYCOLAX/MIRALAX) powder Take 17 g by mouth daily.  . simvastatin (ZOCOR) 40 MG tablet Take 40 mg by mouth daily.  . tamsulosin (FLOMAX) 0.4 MG CAPS capsule Take 0.4 mg by mouth.  . [DISCONTINUED] metFORMIN (GLUCOPHAGE) 1000 MG tablet Take 1,000 mg by mouth 2 (two) times daily with a meal.   No facility-administered encounter medications on file as of 08/18/2017.    ALLERGIES: No Known Allergies VACCINATION STATUS:  There is no immunization history on file  for this patient.  Diabetes  He presents for his follow-up diabetic visit. He has type 2 diabetes mellitus. Onset time: He was diagnosed at approximate age of 63 years. His disease course has been stable. There are no hypoglycemic associated symptoms. Pertinent negatives for hypoglycemia include no confusion, headaches, pallor or seizures. Pertinent negatives for diabetes include no chest pain, no fatigue, no polydipsia, no polyphagia, no polyuria and no weakness. There are no hypoglycemic complications. Symptoms are stable. Diabetic complications include heart disease. Risk factors for coronary artery disease include diabetes mellitus, dyslipidemia, hypertension, male sex and sedentary lifestyle. Current diabetic treatment includes oral agent (monotherapy). His weight is decreasing steadily. He is following a generally unhealthy diet. When asked about meal planning, he reported none. He has not had a previous visit with a dietitian. He participates in exercise intermittently. There is no change in his home blood glucose trend. An ACE inhibitor/angiotensin II receptor blocker is being taken. Eye exam is current.    He has reported complications including CAD which required stent placement, CHF,  and likely retinopathy. Pt is also being treated for neuropathy, pain syndrome, HTN and HPL. he continues to smoke 1PPD and he is not ready to quit. no other interval problems reported.  Review of Systems  Constitutional:  Negative for fatigue and unexpected weight change.  HENT: Negative for dental problem, mouth sores and trouble swallowing.   Eyes: Negative for visual disturbance.  Respiratory: Negative for cough, choking, chest tightness, shortness of breath and wheezing.   Cardiovascular: Negative for chest pain, palpitations and leg swelling.  Gastrointestinal: Negative for abdominal distention, abdominal pain, constipation, diarrhea, nausea and vomiting.  Endocrine: Negative for polydipsia, polyphagia  and polyuria.  Genitourinary: Negative for dysuria, flank pain, hematuria and urgency.  Musculoskeletal: Negative for back pain, gait problem, myalgias and neck pain.  Skin: Negative for pallor, rash and wound.  Neurological: Negative for seizures, syncope, weakness, numbness and headaches.  Psychiatric/Behavioral: Negative.  Negative for confusion and dysphoric mood.    Objective:    BP 136/80   Pulse 74   Ht 6' (1.829 m)   Wt 182 lb (82.6 kg)   BMI 24.68 kg/m   Wt Readings from Last 3 Encounters:  08/18/17 182 lb (82.6 kg)  02/13/17 185 lb (83.9 kg)  11/13/16 188 lb 0.6 oz (85.3 kg)    Physical Exam  Constitutional: He is oriented to person, place, and time. He appears well-developed and well-nourished. He is cooperative. No distress.  HENT:  Head: Normocephalic and atraumatic.  Eyes: EOM are normal.  Neck: Normal range of motion. Neck supple. No tracheal deviation present. No thyromegaly present.  Cardiovascular: Normal rate, S1 normal, S2 normal and normal heart sounds.  Exam reveals no gallop.   No murmur heard. Pulses:      Dorsalis pedis pulses are 1+ on the right side, and 1+ on the left side.       Posterior tibial pulses are 1+ on the right side, and 1+ on the left side.  Pulmonary/Chest: Breath sounds normal. No respiratory distress. He has no wheezes.  Abdominal: Soft. Bowel sounds are normal. He exhibits no distension. There is no tenderness. There is no guarding and no CVA tenderness.  Musculoskeletal: He exhibits no edema.       Right shoulder: He exhibits no swelling and no deformity.  Neurological: He is alert and oriented to person, place, and time. He has normal strength and normal reflexes. No cranial nerve deficit or sensory deficit. Gait normal.  Skin: Skin is warm and dry. No rash noted. No cyanosis. Nails show no clubbing.  Psychiatric: He has a normal mood and affect. His speech is normal and behavior is normal. Judgment and thought content normal.  Cognition and memory are normal.    Results for orders placed or performed in visit on 02/13/17  Hemoglobin A1c  Result Value Ref Range   Hgb A1c MFr Bld 6.0 (H) <5.7 % of total Hgb   Mean Plasma Glucose 126 (calc)   eAG (mmol/L) 7.0 (calc)  COMPLETE METABOLIC PANEL WITH GFR  Result Value Ref Range   Glucose, Bld 218 (H) 65 - 139 mg/dL   BUN 21 7 - 25 mg/dL   Creat 0.97 0.70 - 1.18 mg/dL   GFR, Est Non African American 74 > OR = 60 mL/min/1.12m2   GFR, Est African American 86 > OR = 60 mL/min/1.79m2   BUN/Creatinine Ratio NOT APPLICABLE 6 - 22 (calc)   Sodium 135 135 - 146 mmol/L   Potassium 4.1 3.5 - 5.3 mmol/L   Chloride 95 (L) 98 - 110 mmol/L   CO2 33 (H) 20 - 32 mmol/L   Calcium 8.9 8.6 - 10.3 mg/dL   Total Protein 6.4 6.1 - 8.1 g/dL   Albumin 3.8 3.6 -  5.1 g/dL   Globulin 2.6 1.9 - 3.7 g/dL (calc)   AG Ratio 1.5 1.0 - 2.5 (calc)   Total Bilirubin 0.4 0.2 - 1.2 mg/dL   Alkaline phosphatase (APISO) 79 40 - 115 U/L   AST 15 10 - 35 U/L   ALT 8 (L) 9 - 46 U/L   Complete Blood Count (Most recent): Lab Results  Component Value Date   WBC 12.8 (H) 08/11/2012   HGB 15.9 08/11/2012   HCT 46.9 08/11/2012   MCV 89.8 08/11/2012   PLT 157 08/11/2012   Chemistry (most recent): Lab Results  Component Value Date   NA 135 08/12/2017   K 4.1 08/12/2017   CL 95 (L) 08/12/2017   CO2 33 (H) 08/12/2017   BUN 21 08/12/2017   CREATININE 0.97 08/12/2017    Assessment & Plan:   1. Uncontrolled type 2 diabetes mellitus with complication, with long-term current use of insulin ([place]) -His diabetes is  complicated by CAD and CKD and patient remains at a high risk for more acute and chronic complications of diabetes which include CAD, CVA, CKD, retinopathy, and neuropathy. These are all discussed in detail with the patient.  Patient came with tightly controlled fasting  glucose profile, and  recent A1c Is stable at  6% .   Recent labs reviewed.  - I have re-counseled the patient on  diet management  by adopting a carbohydrate restricted / protein rich  Diet.  -Suggestion is made for him to avoid simple carbohydrates  from his diet including Cakes, Sweet Desserts, Ice Cream, Soda (diet and regular), Sweet Tea, Candies, Chips, Cookies, Store Bought Juices, Alcohol in Excess of  1-2 drinks a day, Artificial Sweeteners, and "Sugar-free" Products. This will help patient to have stable blood glucose profile and potentially avoid unintended weight gain.  - Patient is advised to stick to a routine mealtimes to eat 3 meals  a day and avoid unnecessary snacks ( to snack only to correct hypoglycemia).   - I have approached patient with the following individualized plan to manage diabetes and patient agrees.  -I advised him to Stay off of insulin.   - If he starts to have fasting blood glucose above 200 he will be resumed on 10 units of Levemir daily at bedtime.  - I will continue  metformin  1000mg  po BID.  - Patient specific target  for A1c; LDL, HDL, Triglycerides, and  Waist Circumference were discussed in detail.  2) BP/HTN: controlled. Continue current medications includingACEI/ARB. 3) Lipids/HPL:  continue statins. 4)  Weight/Diet:   he has lost 20 pounds since last year, mainly due to dietary changes (he says he stopped soda) , CDE consult in progress, exercise, and carbohydrates information provided.  5) Chronic Care/Health Maintenance:  -Patient is on ACEI/ARB and Statin medications and encouraged to continue to follow up with Ophthalmology, Podiatrist at least yearly or according to recommendations, and advised to quit smoking. I have recommended yearly flu vaccine and pneumonia vaccination at least every 5 years;  and  sleep for at least 7 hours a day.  - I advised patient to maintain close follow up with Sinda Du, MD for primary care needs.   Follow up plan: -Return in about 6 months (around 02/15/2018) for follow up with pre-visit labs.  Glade Lloyd,  MD Phone: [phone]  Fax: [phone]   This note was partially dictated with voice recognition software. Similar sounding words can be transcribed inadequately or may not  be corrected upon review.  08/18/2017, 11:57 AM

## 2017-08-22 ENCOUNTER — Inpatient Hospital Stay (HOSPITAL_COMMUNITY)
Admission: EM | Admit: 2017-08-22 | Discharge: 2017-08-26 | DRG: 481 | Disposition: A | Discharge status: Skilled Nursing Facility | Payer: Medicare HMO | Attending: Nephrology | Admitting: Nephrology

## 2017-08-22 ENCOUNTER — Inpatient Hospital Stay (HOSPITAL_COMMUNITY): Payer: Medicare HMO

## 2017-08-22 ENCOUNTER — Encounter (HOSPITAL_COMMUNITY): Payer: Self-pay

## 2017-08-22 ENCOUNTER — Emergency Department (HOSPITAL_COMMUNITY): Payer: Medicare HMO

## 2017-08-22 DIAGNOSIS — Z833 Family history of diabetes mellitus: Secondary | ICD-10-CM | POA: Diagnosis not present

## 2017-08-22 DIAGNOSIS — J189 Pneumonia, unspecified organism: Secondary | ICD-10-CM | POA: Diagnosis not present

## 2017-08-22 DIAGNOSIS — S728X9A Other fracture of unspecified femur, initial encounter for closed fracture: Secondary | ICD-10-CM | POA: Diagnosis not present

## 2017-08-22 DIAGNOSIS — R52 Pain, unspecified: Secondary | ICD-10-CM | POA: Diagnosis not present

## 2017-08-22 DIAGNOSIS — J849 Interstitial pulmonary disease, unspecified: Secondary | ICD-10-CM | POA: Diagnosis present

## 2017-08-22 DIAGNOSIS — Z9889 Other specified postprocedural states: Secondary | ICD-10-CM | POA: Diagnosis not present

## 2017-08-22 DIAGNOSIS — D62 Acute posthemorrhagic anemia: Secondary | ICD-10-CM | POA: Diagnosis not present

## 2017-08-22 DIAGNOSIS — Z66 Do not resuscitate: Secondary | ICD-10-CM | POA: Diagnosis present

## 2017-08-22 DIAGNOSIS — M47812 Spondylosis without myelopathy or radiculopathy, cervical region: Secondary | ICD-10-CM

## 2017-08-22 DIAGNOSIS — I4510 Unspecified right bundle-branch block: Secondary | ICD-10-CM | POA: Diagnosis present

## 2017-08-22 DIAGNOSIS — R0902 Hypoxemia: Secondary | ICD-10-CM | POA: Diagnosis present

## 2017-08-22 DIAGNOSIS — I2510 Atherosclerotic heart disease of native coronary artery without angina pectoris: Secondary | ICD-10-CM | POA: Diagnosis present

## 2017-08-22 DIAGNOSIS — J449 Chronic obstructive pulmonary disease, unspecified: Secondary | ICD-10-CM | POA: Diagnosis not present

## 2017-08-22 DIAGNOSIS — M25551 Pain in right hip: Secondary | ICD-10-CM | POA: Diagnosis not present

## 2017-08-22 DIAGNOSIS — F17200 Nicotine dependence, unspecified, uncomplicated: Secondary | ICD-10-CM | POA: Diagnosis not present

## 2017-08-22 DIAGNOSIS — I10 Essential (primary) hypertension: Secondary | ICD-10-CM | POA: Diagnosis present

## 2017-08-22 DIAGNOSIS — J392 Other diseases of pharynx: Secondary | ICD-10-CM | POA: Diagnosis present

## 2017-08-22 DIAGNOSIS — E782 Mixed hyperlipidemia: Secondary | ICD-10-CM | POA: Diagnosis present

## 2017-08-22 DIAGNOSIS — Z7984 Long term (current) use of oral hypoglycemic drugs: Secondary | ICD-10-CM

## 2017-08-22 DIAGNOSIS — W010XXA Fall on same level from slipping, tripping and stumbling without subsequent striking against object, initial encounter: Secondary | ICD-10-CM | POA: Diagnosis present

## 2017-08-22 DIAGNOSIS — M47892 Other spondylosis, cervical region: Secondary | ICD-10-CM | POA: Diagnosis not present

## 2017-08-22 DIAGNOSIS — Z85828 Personal history of other malignant neoplasm of skin: Secondary | ICD-10-CM

## 2017-08-22 DIAGNOSIS — D696 Thrombocytopenia, unspecified: Secondary | ICD-10-CM | POA: Diagnosis not present

## 2017-08-22 DIAGNOSIS — Z79899 Other long term (current) drug therapy: Secondary | ICD-10-CM | POA: Diagnosis not present

## 2017-08-22 DIAGNOSIS — N1330 Unspecified hydronephrosis: Secondary | ICD-10-CM | POA: Diagnosis not present

## 2017-08-22 DIAGNOSIS — Z9049 Acquired absence of other specified parts of digestive tract: Secondary | ICD-10-CM

## 2017-08-22 DIAGNOSIS — Z955 Presence of coronary angioplasty implant and graft: Secondary | ICD-10-CM | POA: Diagnosis not present

## 2017-08-22 DIAGNOSIS — S72141D Displaced intertrochanteric fracture of right femur, subsequent encounter for closed fracture with routine healing: Secondary | ICD-10-CM | POA: Diagnosis not present

## 2017-08-22 DIAGNOSIS — M791 Myalgia: Secondary | ICD-10-CM | POA: Diagnosis not present

## 2017-08-22 DIAGNOSIS — S0990XA Unspecified injury of head, initial encounter: Secondary | ICD-10-CM | POA: Diagnosis not present

## 2017-08-22 DIAGNOSIS — S7221XA Displaced subtrochanteric fracture of right femur, initial encounter for closed fracture: Secondary | ICD-10-CM | POA: Diagnosis not present

## 2017-08-22 DIAGNOSIS — S72141A Displaced intertrochanteric fracture of right femur, initial encounter for closed fracture: Secondary | ICD-10-CM | POA: Diagnosis not present

## 2017-08-22 DIAGNOSIS — S72001A Fracture of unspecified part of neck of right femur, initial encounter for closed fracture: Secondary | ICD-10-CM | POA: Diagnosis not present

## 2017-08-22 DIAGNOSIS — E118 Type 2 diabetes mellitus with unspecified complications: Secondary | ICD-10-CM | POA: Diagnosis not present

## 2017-08-22 DIAGNOSIS — R262 Difficulty in walking, not elsewhere classified: Secondary | ICD-10-CM | POA: Diagnosis not present

## 2017-08-22 DIAGNOSIS — D72829 Elevated white blood cell count, unspecified: Secondary | ICD-10-CM | POA: Diagnosis not present

## 2017-08-22 DIAGNOSIS — Z794 Long term (current) use of insulin: Secondary | ICD-10-CM

## 2017-08-22 DIAGNOSIS — F17210 Nicotine dependence, cigarettes, uncomplicated: Secondary | ICD-10-CM | POA: Diagnosis present

## 2017-08-22 DIAGNOSIS — I25810 Atherosclerosis of coronary artery bypass graft(s) without angina pectoris: Secondary | ICD-10-CM | POA: Diagnosis not present

## 2017-08-22 DIAGNOSIS — S8991XA Unspecified injury of right lower leg, initial encounter: Secondary | ICD-10-CM | POA: Diagnosis not present

## 2017-08-22 DIAGNOSIS — I959 Hypotension, unspecified: Secondary | ICD-10-CM | POA: Diagnosis present

## 2017-08-22 DIAGNOSIS — E1140 Type 2 diabetes mellitus with diabetic neuropathy, unspecified: Secondary | ICD-10-CM | POA: Diagnosis present

## 2017-08-22 DIAGNOSIS — E119 Type 2 diabetes mellitus without complications: Secondary | ICD-10-CM | POA: Diagnosis not present

## 2017-08-22 DIAGNOSIS — R69 Illness, unspecified: Secondary | ICD-10-CM | POA: Diagnosis not present

## 2017-08-22 DIAGNOSIS — S79911A Unspecified injury of right hip, initial encounter: Secondary | ICD-10-CM | POA: Diagnosis not present

## 2017-08-22 DIAGNOSIS — G8911 Acute pain due to trauma: Secondary | ICD-10-CM | POA: Diagnosis not present

## 2017-08-22 DIAGNOSIS — S299XXA Unspecified injury of thorax, initial encounter: Secondary | ICD-10-CM | POA: Diagnosis not present

## 2017-08-22 DIAGNOSIS — M25552 Pain in left hip: Secondary | ICD-10-CM | POA: Diagnosis not present

## 2017-08-22 DIAGNOSIS — Z419 Encounter for procedure for purposes other than remedying health state, unspecified: Secondary | ICD-10-CM

## 2017-08-22 DIAGNOSIS — R221 Localized swelling, mass and lump, neck: Secondary | ICD-10-CM | POA: Diagnosis not present

## 2017-08-22 DIAGNOSIS — Z9181 History of falling: Secondary | ICD-10-CM | POA: Diagnosis not present

## 2017-08-22 DIAGNOSIS — W19XXXA Unspecified fall, initial encounter: Secondary | ICD-10-CM

## 2017-08-22 DIAGNOSIS — M6281 Muscle weakness (generalized): Secondary | ICD-10-CM | POA: Diagnosis not present

## 2017-08-22 DIAGNOSIS — R278 Other lack of coordination: Secondary | ICD-10-CM | POA: Diagnosis not present

## 2017-08-22 DIAGNOSIS — N179 Acute kidney failure, unspecified: Secondary | ICD-10-CM | POA: Diagnosis not present

## 2017-08-22 DIAGNOSIS — E871 Hypo-osmolality and hyponatremia: Secondary | ICD-10-CM | POA: Diagnosis not present

## 2017-08-22 HISTORY — DX: Unspecified osteoarthritis, unspecified site: M19.90

## 2017-08-22 HISTORY — DX: Chronic obstructive pulmonary disease, unspecified: J44.9

## 2017-08-22 HISTORY — DX: Unspecified malignant neoplasm of skin of unspecified part of face: C44.300

## 2017-08-22 LAB — CBC WITH DIFFERENTIAL/PLATELET
BASOS PCT: 0 %
Basophils Absolute: 0 10*3/uL (ref 0.0–0.1)
Eosinophils Absolute: 0.3 10*3/uL (ref 0.0–0.7)
Eosinophils Relative: 3 %
HEMATOCRIT: 41 % (ref 39.0–52.0)
Hemoglobin: 13.7 g/dL (ref 13.0–17.0)
Lymphocytes Relative: 16 %
Lymphs Abs: 1.8 10*3/uL (ref 0.7–4.0)
MCH: 30.9 pg (ref 26.0–34.0)
MCHC: 33.4 g/dL (ref 30.0–36.0)
MCV: 92.6 fL (ref 78.0–100.0)
MONO ABS: 0.7 10*3/uL (ref 0.1–1.0)
Monocytes Relative: 6 %
NEUTROS ABS: 8.2 10*3/uL — AB (ref 1.7–7.7)
Neutrophils Relative %: 75 %
Platelets: 146 10*3/uL — ABNORMAL LOW (ref 150–400)
RBC: 4.43 MIL/uL (ref 4.22–5.81)
RDW: 13.7 % (ref 11.5–15.5)
WBC: 11 10*3/uL — ABNORMAL HIGH (ref 4.0–10.5)

## 2017-08-22 LAB — CREATININE, SERUM
Creatinine, Ser: 0.98 mg/dL (ref 0.61–1.24)
GFR calc Af Amer: >60 mL/min (ref >60)

## 2017-08-22 LAB — SURGICAL PCR SCREEN
MRSA, PCR: NEGATIVE
Staphylococcus aureus: NEGATIVE

## 2017-08-22 LAB — CBG MONITORING, ED
GLUCOSE-CAPILLARY: 193 mg/dL — AB (ref 65–99)
Glucose-Capillary: 196 mg/dL — ABNORMAL HIGH (ref 65–99)

## 2017-08-22 LAB — BASIC METABOLIC PANEL
Anion gap: 9 (ref 5–15)
BUN: 18 mg/dL (ref 6–20)
CALCIUM: 8.7 mg/dL — AB (ref 8.9–10.3)
CO2: 29 mmol/L (ref 22–32)
CREATININE: 0.88 mg/dL (ref 0.61–1.24)
Chloride: 94 mmol/L — ABNORMAL LOW (ref 101–111)
GFR calc non Af Amer: >60 mL/min (ref >60)
GLUCOSE: 196 mg/dL — AB (ref 65–99)
Potassium: 4 mmol/L (ref 3.5–5.1)
Sodium: 132 mmol/L — ABNORMAL LOW (ref 135–145)

## 2017-08-22 LAB — CBC
HCT: 40.9 % (ref 39.0–52.0)
Hemoglobin: 13.7 g/dL (ref 13.0–17.0)
MCH: 30.2 pg (ref 26.0–34.0)
MCHC: 33.5 g/dL (ref 30.0–36.0)
MCV: 90.3 fL (ref 78.0–100.0)
Platelets: 146 10*3/uL — ABNORMAL LOW (ref 150–400)
RBC: 4.53 MIL/uL (ref 4.22–5.81)
RDW: 13.5 % (ref 11.5–15.5)
WBC: 9.8 10*3/uL (ref 4.0–10.5)

## 2017-08-22 LAB — GLUCOSE, CAPILLARY: Glucose-Capillary: 231 mg/dL — ABNORMAL HIGH (ref 65–99)

## 2017-08-22 MED ORDER — FENTANYL CITRATE (PF) 100 MCG/2ML IJ SOLN
50.0000 ug | INTRAMUSCULAR | Status: DC | PRN
  Administered 2017-08-22 (×3): 50 ug via INTRAVENOUS
  Filled 2017-08-22 (×2): qty 2

## 2017-08-22 MED ORDER — HYDROMORPHONE HCL 1 MG/ML IJ SOLN
0.5000 mg | INTRAMUSCULAR | Status: DC | PRN
  Administered 2017-08-22 – 2017-08-23 (×8): 1 mg via INTRAVENOUS
  Filled 2017-08-22 (×8): qty 1

## 2017-08-22 MED ORDER — HYDROMORPHONE HCL 1 MG/ML IJ SOLN
0.5000 mg | INTRAMUSCULAR | Status: DC | PRN
  Administered 2017-08-22: 1 mg via INTRAVENOUS
  Filled 2017-08-22: qty 1

## 2017-08-22 MED ORDER — MORPHINE SULFATE (PF) 4 MG/ML IV SOLN
4.0000 mg | Freq: Once | INTRAVENOUS | Status: AC
  Administered 2017-08-22: 4 mg via INTRAVENOUS
  Filled 2017-08-22: qty 1

## 2017-08-22 MED ORDER — ACETAMINOPHEN 325 MG PO TABS
650.0000 mg | ORAL_TABLET | Freq: Four times a day (QID) | ORAL | Status: DC | PRN
  Administered 2017-08-23 – 2017-08-26 (×10): 650 mg via ORAL
  Filled 2017-08-22 (×11): qty 2

## 2017-08-22 MED ORDER — METOPROLOL TARTRATE 100 MG PO TABS
100.0000 mg | ORAL_TABLET | Freq: Two times a day (BID) | ORAL | Status: DC
  Administered 2017-08-22 – 2017-08-26 (×9): 100 mg via ORAL
  Filled 2017-08-22 (×8): qty 1

## 2017-08-22 MED ORDER — SODIUM CHLORIDE 0.9 % IV SOLN
INTRAVENOUS | Status: AC
  Administered 2017-08-22 – 2017-08-23 (×2): via INTRAVENOUS

## 2017-08-22 MED ORDER — FENTANYL CITRATE (PF) 100 MCG/2ML IJ SOLN
50.0000 ug | Freq: Once | INTRAMUSCULAR | Status: AC
  Administered 2017-08-22: 50 ug via INTRAVENOUS
  Filled 2017-08-22: qty 2

## 2017-08-22 MED ORDER — GABAPENTIN 300 MG PO CAPS
300.0000 mg | ORAL_CAPSULE | Freq: Every day | ORAL | Status: DC
  Administered 2017-08-22 – 2017-08-24 (×3): 300 mg via ORAL
  Filled 2017-08-22 (×3): qty 1

## 2017-08-22 MED ORDER — TAMSULOSIN HCL 0.4 MG PO CAPS
0.4000 mg | ORAL_CAPSULE | Freq: Every day | ORAL | Status: DC
  Administered 2017-08-22 – 2017-08-26 (×5): 0.4 mg via ORAL
  Filled 2017-08-22 (×5): qty 1

## 2017-08-22 MED ORDER — INSULIN ASPART 100 UNIT/ML ~~LOC~~ SOLN
0.0000 [IU] | Freq: Three times a day (TID) | SUBCUTANEOUS | Status: DC
  Administered 2017-08-22: 3 [IU] via SUBCUTANEOUS
  Administered 2017-08-23: 2 [IU] via SUBCUTANEOUS
  Administered 2017-08-23: 3 [IU] via SUBCUTANEOUS
  Administered 2017-08-24: 2 [IU] via SUBCUTANEOUS
  Administered 2017-08-24 (×2): 5 [IU] via SUBCUTANEOUS
  Administered 2017-08-25 (×2): 2 [IU] via SUBCUTANEOUS
  Administered 2017-08-25 – 2017-08-26 (×2): 3 [IU] via SUBCUTANEOUS
  Administered 2017-08-26: 2 [IU] via SUBCUTANEOUS
  Administered 2017-08-26: 3 [IU] via SUBCUTANEOUS

## 2017-08-22 MED ORDER — HYDRALAZINE HCL 20 MG/ML IJ SOLN
10.0000 mg | INTRAMUSCULAR | Status: DC | PRN
  Administered 2017-08-23: 10 mg via INTRAVENOUS
  Filled 2017-08-22: qty 1

## 2017-08-22 MED ORDER — LISINOPRIL 10 MG PO TABS
10.0000 mg | ORAL_TABLET | Freq: Every day | ORAL | Status: DC
  Administered 2017-08-22 – 2017-08-23 (×2): 10 mg via ORAL
  Filled 2017-08-22 (×2): qty 1

## 2017-08-22 MED ORDER — HEPARIN SODIUM (PORCINE) 5000 UNIT/ML IJ SOLN
5000.0000 [IU] | Freq: Three times a day (TID) | INTRAMUSCULAR | Status: DC
  Administered 2017-08-24 – 2017-08-26 (×8): 5000 [IU] via SUBCUTANEOUS
  Filled 2017-08-22 (×8): qty 1

## 2017-08-22 MED ORDER — IPRATROPIUM BROMIDE 0.02 % IN SOLN: 0.5000 mg | RESPIRATORY_TRACT | Status: DC | PRN

## 2017-08-22 MED ORDER — HYDROMORPHONE HCL 1 MG/ML IJ SOLN
0.5000 mg | Freq: Once | INTRAMUSCULAR | Status: AC
  Administered 2017-08-22: 0.5 mg via INTRAVENOUS
  Filled 2017-08-22: qty 1

## 2017-08-22 MED ORDER — ACETAMINOPHEN 650 MG RE SUPP: 650.0000 mg | Freq: Four times a day (QID) | RECTAL | Status: DC | PRN

## 2017-08-22 MED ORDER — SIMVASTATIN 40 MG PO TABS
40.0000 mg | ORAL_TABLET | Freq: Every day | ORAL | Status: DC
Start: 2017-08-23 — End: 2017-08-26
  Administered 2017-08-22 – 2017-08-26 (×4): 40 mg via ORAL
  Filled 2017-08-22 (×4): qty 1

## 2017-08-22 MED ORDER — POLYETHYLENE GLYCOL 3350 17 GM/SCOOP PO POWD: 17.0000 g | Freq: Every day | ORAL | Status: DC

## 2017-08-22 MED ORDER — ONDANSETRON HCL 4 MG PO TABS
4.0000 mg | ORAL_TABLET | Freq: Four times a day (QID) | ORAL | Status: DC | PRN
  Administered 2017-08-24: 4 mg via ORAL
  Filled 2017-08-22: qty 1

## 2017-08-22 MED ORDER — SODIUM CHLORIDE 0.9 % IV SOLN
INTRAVENOUS | Status: AC
  Administered 2017-08-22: 07:00:00 via INTRAVENOUS

## 2017-08-22 MED ORDER — ONDANSETRON HCL 4 MG/2ML IJ SOLN
4.0000 mg | Freq: Four times a day (QID) | INTRAMUSCULAR | Status: DC | PRN
Start: 2017-08-22 — End: 2017-08-26
  Administered 2017-08-23 (×2): 4 mg via INTRAVENOUS
  Filled 2017-08-22 (×3): qty 2

## 2017-08-22 MED ORDER — ALBUTEROL SULFATE (2.5 MG/3ML) 0.083% IN NEBU: 2.5000 mg | INHALATION_SOLUTION | RESPIRATORY_TRACT | Status: DC | PRN

## 2017-08-22 NOTE — Progress Notes (Signed)
Orthopedic Tech Progress Note Patient Details:  Joshua Fletcher Dec 31, 1935 [id number]  Musculoskeletal Traction Type of Traction: Other (Comment) Traction Location: Pt refused Traction to leg.  Pt was stable and did not want to go into traction in fear of being in more pain.     Kristopher Oppenheim 08/22/2017, 9:00 PM

## 2017-08-22 NOTE — ED Notes (Signed)
[facility] paged to Dr Wyvonnia Dusky

## 2017-08-22 NOTE — ED Provider Notes (Signed)
[place] DEPT Provider Note   CSN: [id number] Arrival date & time: 08/22/17  0348     History   Chief Complaint Chief Complaint  Patient presents with  . Fall    hip pain    HPI Joshua Fletcher is a 81 y.o. male.  Patient with history of CAD, diabetes, hypertension presenting with right hip pain after a fall. Patient states he was inside the house and went from a hardwood to a carpeted area and fell onto his right hip. Did hit his head but did not lose consciousness. Denies any head or neck pain. He is not anticoagulated. Severe pain in right hip and unable to put weight on it. No focal weakness, numbness or tingling. No neck or back pain.   The history is provided by the patient and the EMS personnel.    Past Medical History:  Diagnosis Date  . Constipation   . Coronary artery disease   . Diabetes mellitus   . Hypertension     Patient Active Problem List   Diagnosis Date Noted  . Melanoma of skin ([place]) 02/13/2017  . Uncontrolled type 2 diabetes mellitus with complication, with long-term current use of insulin ([place]) 10/24/2015  . Mixed hyperlipidemia 10/24/2015  . Essential hypertension, benign 10/24/2015    Past Surgical History:  Procedure Laterality Date  . APPENDECTOMY  1958  . BACK SURGERY    . CORONARY STENT PLACEMENT         Home Medications    Prior to Admission medications   Medication Sig Start Date End Date Taking? Authorizing Provider  gabapentin (NEURONTIN) 300 MG capsule Take 300 mg by mouth at bedtime.    [provider]  glucose blood test strip Four times daily testing Dx: E11.8 04/19/16   Fayrene Helper, MD  Insulin Pen Needle (B-D ULTRAFINE III SHORT PEN) 31G X 8 MM MISC USE ONE  AS DIRECTED 09/24/16   Nida, Marella Chimes, MD  lisinopril (PRINIVIL,ZESTRIL) 10 MG tablet Take 10 mg by mouth daily.    [provider]  magnesium citrate solution Take 296 mLs by mouth once. 08/11/12   Davonna Belling, MD  metFORMIN  (GLUCOPHAGE) 1000 MG tablet Take 1 tablet (1,000 mg total) by mouth 2 (two) times daily with a meal. 08/18/17   Nida, Marella Chimes, MD  metoprolol (LOPRESSOR) 50 MG tablet Take 50 mg by mouth 2 (two) times daily.    [provider]  polyethylene glycol powder (GLYCOLAX/MIRALAX) powder Take 17 g by mouth daily. 08/11/12   Davonna Belling, MD  simvastatin (ZOCOR) 40 MG tablet Take 40 mg by mouth daily.    [provider]  tamsulosin (FLOMAX) 0.4 MG CAPS capsule Take 0.4 mg by mouth.    [provider]    Family History Family History  Problem Relation Age of Onset  . Diabetes Mother     Social History Social History  Substance Use Topics  . Smoking status: Current Every Day Smoker    Packs/day: 0.50    Types: Cigarettes  . Smokeless tobacco: Never Used  . Alcohol use No     Allergies   Patient has no known allergies.   Review of Systems Review of Systems  Constitutional: Negative for activity change, appetite change and fever.  HENT: Negative for congestion and rhinorrhea.   Respiratory: Negative for cough, chest tightness and shortness of breath.   Cardiovascular: Negative for chest pain.  Gastrointestinal: Negative for abdominal pain, nausea and vomiting.  Genitourinary: Negative for  dysuria and hematuria.  Musculoskeletal: Positive for arthralgias and myalgias. Negative for back pain and neck pain.  Skin: Negative for rash.  Neurological: Negative for dizziness, weakness, light-headedness and headaches.    all other systems are negative except as noted in the HPI and PMH.     Physical Exam Updated Vital Signs BP (!) 155/82   Pulse 75   Temp 97.9 F (36.6 C) (Oral)   Resp 19   Ht 6' (1.829 m)   Wt 83 kg (183 lb)   SpO2 93%   BMI 24.82 kg/m   Physical Exam  Constitutional: He is oriented to person, place, and time. He appears well-developed and well-nourished. No distress.  Laying on L side  HENT:  Head: Normocephalic and  atraumatic.  Mouth/Throat: Oropharynx is clear and moist. No oropharyngeal exudate.  Eyes: Pupils are equal, round, and reactive to light. Conjunctivae and EOM are normal.  Neck: Normal range of motion. Neck supple.  No C spine tenderness  Cardiovascular: Normal rate, regular rhythm, normal heart sounds and intact distal pulses.   No murmur heard. Pulmonary/Chest: Effort normal and breath sounds normal. No respiratory distress.  Abdominal: Soft. There is no tenderness. There is no rebound and no guarding.  Musculoskeletal: Normal range of motion. He exhibits tenderness and deformity. He exhibits no edema.  RLE shortened and externally rotated. Intact PT pulse with doppler  Neurological: He is alert and oriented to person, place, and time. No cranial nerve deficit. He exhibits normal muscle tone. Coordination normal.   5/5 strength throughout. CN 2-12 intact.Equal grip strength.   Skin: Skin is warm. Capillary refill takes less than 2 seconds.  Psychiatric: He has a normal mood and affect. His behavior is normal.  Nursing note and vitals reviewed.    ED Treatments / Results  Labs (all labs ordered are listed, but only abnormal results are displayed) Labs Reviewed  CBC WITH DIFFERENTIAL/PLATELET - Abnormal; Notable for the following:       Result Value   WBC 11.0 (*)    Platelets 146 (*)    Neutro Abs 8.2 (*)    All other components within normal limits  BASIC METABOLIC PANEL - Abnormal; Notable for the following:    Sodium 132 (*)    Chloride 94 (*)    Glucose, Bld 196 (*)    Calcium 8.7 (*)    All other components within normal limits  CBG MONITORING, ED - Abnormal; Notable for the following:    Glucose-Capillary 196 (*)    All other components within normal limits    EKG  EKG Interpretation  Date/Time:  Friday August 22 2017 05:11:11 EDT Ventricular Rate:  65 PR Interval:    QRS Duration: 145 QT Interval:  445 QTC Calculation: 463 R Axis:   72 Text  Interpretation:  Sinus rhythm Borderline prolonged PR interval Right bundle branch block No previous ECGs available Confirmed by Ezequiel Essex [phone]) on 08/22/2017 5:38:33 AM       Radiology Dg Chest 1 View  Result Date: 08/22/2017 CLINICAL DATA:  Trip and fall.  Left hip pain. EXAM: CHEST 1 VIEW COMPARISON:  Radiographs 07/22/2011. Lung bases from abdominal CT 08/11/2012 FINDINGS: The heart is enlarged. There is bilateral hilar prominence, right greater than left. Atherosclerosis of the aortic arch. Diffuse interstitial coarsening with ill-defined reticular opacities in the periphery both lungs and at the left lung base. No pneumothorax or confluent airspace disease. No pleural fluid. Surgical hardware in the lower cervical spine. No evidence of  acute rib fracture. IMPRESSION: 1. Cardiomegaly with atherosclerotic aorta. Bilateral hilar prominence suggest pulmonary arterial hypertension. Adenopathy not entirely excluded. 2. Peripheral define reticular opacities throughout both lungs and the left lung base, suggesting interstitial lung disease and pulmonary fibrosis. Recommend chest CT characterization, high-resolution chest CT could be considered for interstitial lung disease evaluation. This has progressed from prior exams. Electronically Signed   By: Jeb Levering M.D.   On: 08/22/2017 05:25   Ct Head Wo Contrast  Result Date: 08/22/2017 CLINICAL DATA:  81 y/o  M; status post fall. EXAM: CT HEAD WITHOUT CONTRAST CT CERVICAL SPINE WITHOUT CONTRAST TECHNIQUE: Multidetector CT imaging of the head and cervical spine was performed following the standard protocol without intravenous contrast. Multiplanar CT image reconstructions of the cervical spine were also generated. COMPARISON:  None. FINDINGS: CT HEAD FINDINGS Brain: No evidence of acute infarction, hemorrhage, hydrocephalus, extra-axial collection or mass lesion/mass effect. Chronic right parietal cortical infarction. Small chronic lacunar  infarctions within bilateral lentiform nuclei, left caudate head, and right thalamus. Moderate chronic microvascular ischemic changes and parenchymal volume loss of the brain. Vascular: Extensive calcific atherosclerosis of carotid siphons. Skull: Normal. Negative for fracture or focal lesion. Sinuses/Orbits: Paranasal sinus disease greatest in right maxillary sinus with there is moderate mucosal thickening and aerosolized secretions. Normal aeration of mastoid air cells. Bilateral intra-ocular lens replacement. Other: None. CT CERVICAL SPINE FINDINGS Alignment: Grade 1 C3-4 retrolisthesis, grade 1 C2-3 anterolisthesis, grade 1 C7-T1 anterolisthesis. Skull base and vertebrae: The C4-C6 anterior cervical discectomy solid interbody fusion. No acute fracture or loss of vertebral body height identified. 9 mm ossification within the right C6-7 ligamentum flavum. Soft tissues and spinal canal:  Negative. Disc levels: Severe adjacent segment disease at the C3-4 and C6-7 levels with loss of disc space height. Disc and facet disease contributes to at least moderate canal stenosis at C3-4 and C6-7 levels. Bilateral bony foraminal stenosis greatest at the C3-4 and C4-5 levels due to uncovertebral and facet hypertrophy. Upper chest: Negative. Other: The calcification of right palatine tonsil compatible sequelae prior infection. Low-attenuation mass within the right lateral oropharynx mucosa measuring 12 x 16 x 20 mm (AP x ML x CC series 8, image 39 and series 10, image 13). IMPRESSION: CT head: 1. No acute intracranial abnormality or calvarial fracture. 2. Small chronic right parietal cortical infarction. Small chronic lacunar infarcts in basal ganglia. 3. Moderate chronic microvascular ischemic changes and parenchymal volume loss of the brain. 4. Paranasal sinus disease with mucosal thickening and aerosolized secretions in the right maxillary sinus which may represent acute sinusitis. CT cervical spine: 1. No acute fracture or  dislocation identified. 2. C4-C6 anterior cervical discectomy and fusion. 3. Cervical spondylosis with severe adjacent segment disease at the C3-4 and C6-7 levels where there is at least moderate canal stenosis. 4. Low-attenuation well-circumscribed mass within the mucosa of right lateral oropharynx measuring up to 20 mm. Direct visualization recommended. Electronically Signed   By: Kristine Garbe M.D.   On: 08/22/2017 05:29   Ct Cervical Spine Wo Contrast  Result Date: 08/22/2017 CLINICAL DATA:  81 y/o  M; status post fall. EXAM: CT HEAD WITHOUT CONTRAST CT CERVICAL SPINE WITHOUT CONTRAST TECHNIQUE: Multidetector CT imaging of the head and cervical spine was performed following the standard protocol without intravenous contrast. Multiplanar CT image reconstructions of the cervical spine were also generated. COMPARISON:  None. FINDINGS: CT HEAD FINDINGS Brain: No evidence of acute infarction, hemorrhage, hydrocephalus, extra-axial collection or mass lesion/mass effect. Chronic right parietal cortical infarction.  Small chronic lacunar infarctions within bilateral lentiform nuclei, left caudate head, and right thalamus. Moderate chronic microvascular ischemic changes and parenchymal volume loss of the brain. Vascular: Extensive calcific atherosclerosis of carotid siphons. Skull: Normal. Negative for fracture or focal lesion. Sinuses/Orbits: Paranasal sinus disease greatest in right maxillary sinus with there is moderate mucosal thickening and aerosolized secretions. Normal aeration of mastoid air cells. Bilateral intra-ocular lens replacement. Other: None. CT CERVICAL SPINE FINDINGS Alignment: Grade 1 C3-4 retrolisthesis, grade 1 C2-3 anterolisthesis, grade 1 C7-T1 anterolisthesis. Skull base and vertebrae: The C4-C6 anterior cervical discectomy solid interbody fusion. No acute fracture or loss of vertebral body height identified. 9 mm ossification within the right C6-7 ligamentum flavum. Soft tissues  and spinal canal:  Negative. Disc levels: Severe adjacent segment disease at the C3-4 and C6-7 levels with loss of disc space height. Disc and facet disease contributes to at least moderate canal stenosis at C3-4 and C6-7 levels. Bilateral bony foraminal stenosis greatest at the C3-4 and C4-5 levels due to uncovertebral and facet hypertrophy. Upper chest: Negative. Other: The calcification of right palatine tonsil compatible sequelae prior infection. Low-attenuation mass within the right lateral oropharynx mucosa measuring 12 x 16 x 20 mm (AP x ML x CC series 8, image 39 and series 10, image 13). IMPRESSION: CT head: 1. No acute intracranial abnormality or calvarial fracture. 2. Small chronic right parietal cortical infarction. Small chronic lacunar infarcts in basal ganglia. 3. Moderate chronic microvascular ischemic changes and parenchymal volume loss of the brain. 4. Paranasal sinus disease with mucosal thickening and aerosolized secretions in the right maxillary sinus which may represent acute sinusitis. CT cervical spine: 1. No acute fracture or dislocation identified. 2. C4-C6 anterior cervical discectomy and fusion. 3. Cervical spondylosis with severe adjacent segment disease at the C3-4 and C6-7 levels where there is at least moderate canal stenosis. 4. Low-attenuation well-circumscribed mass within the mucosa of right lateral oropharynx measuring up to 20 mm. Direct visualization recommended. Electronically Signed   By: Kristine Garbe M.D.   On: 08/22/2017 05:29   Dg Hip Unilat W Or Wo Pelvis 2-3 Views Right  Result Date: 08/22/2017 CLINICAL DATA:  81 y/o M; status post fall with severe right hip pain. EXAM: DG HIP (WITH OR WITHOUT PELVIS) 2-3V RIGHT COMPARISON:  None. FINDINGS: The acute intertrochanteric fracture of the right proximal femur extending into the proximal diaphysis with apex lateral angulation and minimal displacement. No dislocation of the hip joint. No pelvic fracture or  diastases identified. Vascular calcifications for IMPRESSION: Acute intertrochanteric fracture right proximal femur extending into proximal diaphysis with apex lateral angulation and minimal displacement. Electronically Signed   By: Kristine Garbe M.D.   On: 08/22/2017 05:20    Procedures Procedures (including critical care time)  Medications Ordered in ED Medications - No data to display   Initial Impression / Assessment and Plan / ED Course  I have reviewed the triage vital signs and the nursing notes.  Pertinent labs & imaging results that were available during my care of the patient were reviewed by me and considered in my medical decision making (see chart for details).    Patient with right hip pain after mechanical fall. Not anticoagulated. Denies head, neck or back pain. Does take chronic pain medication for his back.  CT head and C-spine negative. Mass in oropharynx not visualized directly. Discussed with patient.  Right intertrochanteric hip fracture on x-ray confirmed. Discussed with Dr. Aline Brochure. Patient's neurovascular intact.  Dr. Aline Brochure reviewed patient's x-rays and  feel he would be better served at [facility] given his comorbidities andprevious cervical spine surgery.  Discussed with Dr. Lyla Glassing orthopedics who states patient's surgical not be until tomorrow and patient may eat. Admission discussed to hospitalist service at [facility] with Dr. Shanon Brow and Dr. Wynetta Emery. Final Clinical Impressions(s) / ED Diagnoses   Final diagnoses:  Closed intertrochanteric fracture of hip, right, initial encounter [facility])    New Prescriptions New Prescriptions   No medications on file     Ezequiel Essex, MD 08/22/17 [phone]

## 2017-08-22 NOTE — H&P (Signed)
History and Physical  Joshua Fletcher XNA:[id number] DOB: [birth year] DOA: 08/22/2017  Referring physician: Wyvonnia Dusky  PCP: Sinda Du, MD   Chief Complaint: fall and hip pain  HPI: Joshua Fletcher is a 81 y.o. male with multiple medical problems detailed below including diabetes, coronary artery disease and hypertension who presented to the emergency department with a painful right hip after a fall. He reported that he was outside smoking and when he went inside the house he ended up falling onto his right hip. He hit his head but did not lose consciousness. The patient reports severe right hip and leg pain. The patient reported that he has been unable to bear weight or ambulate because of the severe pain. He was evaluated in the emergency department and found to have an acute right hip fracture. He was seen by the orthopedic service at [facility] who recommended transferring him to a higher level of care given his multiple comorbidities, the nature of the fracture and high risk for anesthesia. Orthopedics at [facility] was consult treated and Dr. Barbaraann Boys will see him in consultation at [facility]. They're not planning to do surgery until tomorrow per ED physician.  Review of Systems: All systems reviewed and apart from history of presenting illness, are negative.  Past Medical History:  Diagnosis Date  . Constipation   . COPD (chronic obstructive pulmonary disease) ([place])   . Coronary artery disease   . Diabetes mellitus   . Hypertension    Past Surgical History:  Procedure Laterality Date  . APPENDECTOMY  1958  . BACK SURGERY    . CORONARY STENT PLACEMENT     Social History:  reports that he has been smoking Cigarettes.  He has been smoking about 0.50 packs per day. He has never used smokeless tobacco. He reports that he does not drink alcohol or use drugs.  No Known Allergies  Family History  Problem Relation Age of Onset  . Diabetes Mother     Prior to Admission  medications   Medication Sig Start Date End Date Taking? Authorizing Provider  oxyCODONE-acetaminophen (PERCOCET) 10-325 MG tablet Take 1 tablet by mouth 5 (five) times daily as needed for pain.   Yes [provider]  gabapentin (NEURONTIN) 300 MG capsule Take 300 mg by mouth at bedtime.    [provider]  glucose blood test strip Four times daily testing Dx: E11.8 04/19/16   Fayrene Helper, MD  Insulin Pen Needle (B-D ULTRAFINE III SHORT PEN) 31G X 8 MM MISC USE ONE  AS DIRECTED 09/24/16   Nida, Marella Chimes, MD  lisinopril (PRINIVIL,ZESTRIL) 10 MG tablet Take 10 mg by mouth daily.    [provider]  magnesium citrate solution Take 296 mLs by mouth once. 08/11/12   Davonna Belling, MD  metFORMIN (GLUCOPHAGE) 1000 MG tablet Take 1 tablet (1,000 mg total) by mouth 2 (two) times daily with a meal. 08/18/17   Nida, Marella Chimes, MD  metoprolol (LOPRESSOR) 50 MG tablet Take 100 mg by mouth 2 (two) times daily.     [provider]  polyethylene glycol powder (GLYCOLAX/MIRALAX) powder Take 17 g by mouth daily. 08/11/12   Davonna Belling, MD  simvastatin (ZOCOR) 40 MG tablet Take 40 mg by mouth daily.    [provider]  tamsulosin (FLOMAX) 0.4 MG CAPS capsule Take 0.4 mg by mouth.    [provider]   Physical Exam: Vitals:   08/22/17 5427 08/22/17 0623 08/22/17 7628 08/22/17 3151  BP: (!) 160/70   (!) 155/82  Pulse: 62  75   Resp: 16  19   Temp: 97.7 F (36.5 C)   97.9 F (36.6 C)  TempSrc: Oral   Oral  SpO2: (!) 88%  93%   Weight:  83 kg (183 lb)    Height:  6' (1.829 m)      Constitutional: He is oriented to person, place, and time. He appears well-developed and well-nourished. No distress.  HENT: Head: Normocephalic and atraumatic.  Mouth/Throat: Oropharynx is clear and dry. No oropharyngeal exudate.  Eyes: Pupils are equal, round, and reactive to light. Conjunctivae and EOM are normal.  Neck: Normal range of motion.  Neck supple. No C spine tenderness  Cardiovascular: Normal s1, s2 sounds, and intact distal pulses.  No murmur heard. Pulmonary/Chest: Effort normal and breath sounds normal. No respiratory distress.  Abdominal: Soft. There is no tenderness. There is no rebound and no guarding.  Musculoskeletal: Normal range of motion. He exhibits tenderness and deformity. He exhibits no edema. RLE shortened and externally rotated. Neurological: He is alert and oriented to person, place, and time. No cranial nerve deficit. He exhibits normal muscle tone. Coordination normal.   5/5 strength throughout. CN 2-12 intact.Equal grip strength.   Skin: Skin is warm. Capillary refill takes less than 2 seconds.  Psychiatric: He has a normal mood and affect. His behavior is normal.  Labs on Admission:  Basic Metabolic Panel:  Recent Labs Lab 08/22/17 0459  NA 132*  K 4.0  CL 94*  CO2 29  GLUCOSE 196*  BUN 18  CREATININE 0.88  CALCIUM 8.7*   Liver Function Tests: No results for input(s): AST, ALT, ALKPHOS, BILITOT, PROT, ALBUMIN in the last 168 hours. No results for input(s): LIPASE, AMYLASE in the last 168 hours. No results for input(s): AMMONIA in the last 168 hours. CBC:  Recent Labs Lab 08/22/17 0459  WBC 11.0*  NEUTROABS 8.2*  HGB 13.7  HCT 41.0  MCV 92.6  PLT 146*   Cardiac Enzymes: No results for input(s): CKTOTAL, CKMB, CKMBINDEX, TROPONINI in the last 168 hours.  BNP (last 3 results) No results for input(s): PROBNP in the last 8760 hours. CBG:  Recent Labs Lab 08/22/17 0730  GLUCAP 196*    Radiological Exams on Admission: Dg Chest 1 View  Result Date: 08/22/2017 CLINICAL DATA:  Trip and fall.  Left hip pain. EXAM: CHEST 1 VIEW COMPARISON:  Radiographs 07/22/2011. Lung bases from abdominal CT 08/11/2012 FINDINGS: The heart is enlarged. There is bilateral hilar prominence, right greater than left. Atherosclerosis of the aortic arch. Diffuse interstitial coarsening with ill-defined  reticular opacities in the periphery both lungs and at the left lung base. No pneumothorax or confluent airspace disease. No pleural fluid. Surgical hardware in the lower cervical spine. No evidence of acute rib fracture. IMPRESSION: 1. Cardiomegaly with atherosclerotic aorta. Bilateral hilar prominence suggest pulmonary arterial hypertension. Adenopathy not entirely excluded. 2. Peripheral define reticular opacities throughout both lungs and the left lung base, suggesting interstitial lung disease and pulmonary fibrosis. Recommend chest CT characterization, high-resolution chest CT could be considered for interstitial lung disease evaluation. This has progressed from prior exams. Electronically Signed   By: Jeb Levering M.D.   On: 08/22/2017 05:25   Ct Head Wo Contrast  Result Date: 08/22/2017 CLINICAL DATA:  81 y/o  M; status post fall. EXAM: CT HEAD WITHOUT CONTRAST CT CERVICAL SPINE WITHOUT CONTRAST TECHNIQUE: Multidetector CT imaging of the head and cervical spine was performed following  the standard protocol without intravenous contrast. Multiplanar CT image reconstructions of the cervical spine were also generated. COMPARISON:  None. FINDINGS: CT HEAD FINDINGS Brain: No evidence of acute infarction, hemorrhage, hydrocephalus, extra-axial collection or mass lesion/mass effect. Chronic right parietal cortical infarction. Small chronic lacunar infarctions within bilateral lentiform nuclei, left caudate head, and right thalamus. Moderate chronic microvascular ischemic changes and parenchymal volume loss of the brain. Vascular: Extensive calcific atherosclerosis of carotid siphons. Skull: Normal. Negative for fracture or focal lesion. Sinuses/Orbits: Paranasal sinus disease greatest in right maxillary sinus with there is moderate mucosal thickening and aerosolized secretions. Normal aeration of mastoid air cells. Bilateral intra-ocular lens replacement. Other: None. CT CERVICAL SPINE FINDINGS Alignment:  Grade 1 C3-4 retrolisthesis, grade 1 C2-3 anterolisthesis, grade 1 C7-T1 anterolisthesis. Skull base and vertebrae: The C4-C6 anterior cervical discectomy solid interbody fusion. No acute fracture or loss of vertebral body height identified. 9 mm ossification within the right C6-7 ligamentum flavum. Soft tissues and spinal canal:  Negative. Disc levels: Severe adjacent segment disease at the C3-4 and C6-7 levels with loss of disc space height. Disc and facet disease contributes to at least moderate canal stenosis at C3-4 and C6-7 levels. Bilateral bony foraminal stenosis greatest at the C3-4 and C4-5 levels due to uncovertebral and facet hypertrophy. Upper chest: Negative. Other: The calcification of right palatine tonsil compatible sequelae prior infection. Low-attenuation mass within the right lateral oropharynx mucosa measuring 12 x 16 x 20 mm (AP x ML x CC series 8, image 39 and series 10, image 13). IMPRESSION: CT head: 1. No acute intracranial abnormality or calvarial fracture. 2. Small chronic right parietal cortical infarction. Small chronic lacunar infarcts in basal ganglia. 3. Moderate chronic microvascular ischemic changes and parenchymal volume loss of the brain. 4. Paranasal sinus disease with mucosal thickening and aerosolized secretions in the right maxillary sinus which may represent acute sinusitis. CT cervical spine: 1. No acute fracture or dislocation identified. 2. C4-C6 anterior cervical discectomy and fusion. 3. Cervical spondylosis with severe adjacent segment disease at the C3-4 and C6-7 levels where there is at least moderate canal stenosis. 4. Low-attenuation well-circumscribed mass within the mucosa of right lateral oropharynx measuring up to 20 mm. Direct visualization recommended. Electronically Signed   By: Kristine Garbe M.D.   On: 08/22/2017 05:29   Ct Cervical Spine Wo Contrast  Result Date: 08/22/2017 CLINICAL DATA:  81 y/o  M; status post fall. EXAM: CT HEAD WITHOUT  CONTRAST CT CERVICAL SPINE WITHOUT CONTRAST TECHNIQUE: Multidetector CT imaging of the head and cervical spine was performed following the standard protocol without intravenous contrast. Multiplanar CT image reconstructions of the cervical spine were also generated. COMPARISON:  None. FINDINGS: CT HEAD FINDINGS Brain: No evidence of acute infarction, hemorrhage, hydrocephalus, extra-axial collection or mass lesion/mass effect. Chronic right parietal cortical infarction. Small chronic lacunar infarctions within bilateral lentiform nuclei, left caudate head, and right thalamus. Moderate chronic microvascular ischemic changes and parenchymal volume loss of the brain. Vascular: Extensive calcific atherosclerosis of carotid siphons. Skull: Normal. Negative for fracture or focal lesion. Sinuses/Orbits: Paranasal sinus disease greatest in right maxillary sinus with there is moderate mucosal thickening and aerosolized secretions. Normal aeration of mastoid air cells. Bilateral intra-ocular lens replacement. Other: None. CT CERVICAL SPINE FINDINGS Alignment: Grade 1 C3-4 retrolisthesis, grade 1 C2-3 anterolisthesis, grade 1 C7-T1 anterolisthesis. Skull base and vertebrae: The C4-C6 anterior cervical discectomy solid interbody fusion. No acute fracture or loss of vertebral body height identified. 9 mm ossification within the right C6-7 ligamentum  flavum. Soft tissues and spinal canal:  Negative. Disc levels: Severe adjacent segment disease at the C3-4 and C6-7 levels with loss of disc space height. Disc and facet disease contributes to at least moderate canal stenosis at C3-4 and C6-7 levels. Bilateral bony foraminal stenosis greatest at the C3-4 and C4-5 levels due to uncovertebral and facet hypertrophy. Upper chest: Negative. Other: The calcification of right palatine tonsil compatible sequelae prior infection. Low-attenuation mass within the right lateral oropharynx mucosa measuring 12 x 16 x 20 mm (AP x ML x CC series 8,  image 39 and series 10, image 13). IMPRESSION: CT head: 1. No acute intracranial abnormality or calvarial fracture. 2. Small chronic right parietal cortical infarction. Small chronic lacunar infarcts in basal ganglia. 3. Moderate chronic microvascular ischemic changes and parenchymal volume loss of the brain. 4. Paranasal sinus disease with mucosal thickening and aerosolized secretions in the right maxillary sinus which may represent acute sinusitis. CT cervical spine: 1. No acute fracture or dislocation identified. 2. C4-C6 anterior cervical discectomy and fusion. 3. Cervical spondylosis with severe adjacent segment disease at the C3-4 and C6-7 levels where there is at least moderate canal stenosis. 4. Low-attenuation well-circumscribed mass within the mucosa of right lateral oropharynx measuring up to 20 mm. Direct visualization recommended. Electronically Signed   By: Kristine Garbe M.D.   On: 08/22/2017 05:29   Dg Hip Unilat W Or Wo Pelvis 2-3 Views Right  Result Date: 08/22/2017 CLINICAL DATA:  81 y/o M; status post fall with severe right hip pain. EXAM: DG HIP (WITH OR WITHOUT PELVIS) 2-3V RIGHT COMPARISON:  None. FINDINGS: The acute intertrochanteric fracture of the right proximal femur extending into the proximal diaphysis with apex lateral angulation and minimal displacement. No dislocation of the hip joint. No pelvic fracture or diastases identified. Vascular calcifications for IMPRESSION: Acute intertrochanteric fracture right proximal femur extending into proximal diaphysis with apex lateral angulation and minimal displacement. Electronically Signed   By: Kristine Garbe M.D.   On: 08/22/2017 05:20    EKG: Independently reviewed.   Assessment/Plan Principal Problem:   Intertrochanteric fracture of right hip, closed, initial encounter [facility]) Active Problems:   Hip fracture, right (HCC)   COPD (chronic obstructive pulmonary disease) (HCC)   Coronary artery disease   Mass  of oropharynx   Mixed hyperlipidemia   Essential hypertension, benign   Smoker   Type 2 diabetes mellitus (HCC)   Thrombocytopenia (HCC)   Hyponatremia   Leukocytosis   Cervical spondylosis   1. Acute right hip fracture - Pt has been evaluated at [facility] by Dr. Kenton Kingfisher who recommends transfer to higher level of care. The patient will be transferred to [facility] and seen by Dr. Rise Patience [facility] orthopedic consult. The tentative plan is for him to have surgery tomorrow.  Apply bucks traction order placed.   2. COPD - currently stable, nonsymptomatic, follow clinically. His chest x-ray suggests interstitial lung disease and pulmonary fibrosis. He may need a pulmonary consult for preop clearance.  3. Leukocytosis-suspect this is reactive to acute fracture, no signs or symptoms of acute infection found. Follow CBC. 4. Type 2 diabetes mellitus-supplemental sliding scale insulin ordered, holding all oral diabetes medications at this time. 5. Essential hypertension-stable and controlled on home medications, follow closely. 6. Thrombocytopenia-likely reactive, follow closely. 7. Hyponatremia-treating with gentle normal saline infusion. Follow BMP. 8. Mass of the oropharynx-incidental finding noted on imaging studies, recommend ENT evaluation when he arrives at [facility]. 9. CAD-stable, resume all home medications and follow  closely. EKG reviewed no acute findings.    DVT Prophylaxis: heparin Code Status: FULL   Family Communication: bedside  Disposition Plan: TBD   I spoke with Dr. Jonnie Finner at [facility] for sign out.   Time spent: 55 mins  Irwin Brakeman, MD [facility] Pager [phone]  If 7PM-7AM, please contact night-coverage [email or web address] Password [facility] 08/22/2017, 7:46 AM

## 2017-08-22 NOTE — ED Triage Notes (Signed)
Pt was at home, started to go outside to smoke a cigarette when he tripped and fell.  Pt c/o severe pain to his right hip.

## 2017-08-22 NOTE — Consult Note (Signed)
  ER CALL  Asked to advise on Joshua Fletcher who is a chronological age of 81 yo (but physiologically older) male with  Past Medical History:  Diagnosis Date  . Constipation   . COPD (chronic obstructive pulmonary disease) ([place])   . Coronary artery disease   . Diabetes mellitus   . Hypertension     Who has a comminuted subtrochanteric fracture of the right femur   Past Surgical History:  Procedure Laterality Date  . APPENDECTOMY  1958  . BACK SURGERY    . CORONARY STENT PLACEMENT CERVICAL FUSION     I advised transfer to higher level of care 2ndary to   1. His anesthetic risk  2. Our anestheisa staffing this weekend ([facility] ONLY) 3. Difficult intubation 4. Petra Kuba of the fracture

## 2017-08-22 NOTE — ED Notes (Signed)
urianted in urinal x 2 since 7am. Total 800 ml

## 2017-08-22 NOTE — ED Notes (Signed)
Water given. Sat pt up slowly approx 1 inch. Pt stated 5 min later to lay him back down and pain was 5. edp aware.

## 2017-08-23 ENCOUNTER — Inpatient Hospital Stay (HOSPITAL_COMMUNITY): Payer: Medicare HMO

## 2017-08-23 ENCOUNTER — Inpatient Hospital Stay (HOSPITAL_COMMUNITY): Payer: Medicare HMO | Admitting: Anesthesiology

## 2017-08-23 ENCOUNTER — Encounter (HOSPITAL_COMMUNITY)
Admission: EM | Disposition: A | Discharge status: Skilled Nursing Facility | Payer: Self-pay | Source: Home / Self Care | Attending: Nephrology

## 2017-08-23 ENCOUNTER — Encounter (HOSPITAL_COMMUNITY): Payer: Self-pay | Admitting: Anesthesiology

## 2017-08-23 DIAGNOSIS — J449 Chronic obstructive pulmonary disease, unspecified: Secondary | ICD-10-CM

## 2017-08-23 DIAGNOSIS — S72141A Displaced intertrochanteric fracture of right femur, initial encounter for closed fracture: Secondary | ICD-10-CM

## 2017-08-23 DIAGNOSIS — R221 Localized swelling, mass and lump, neck: Secondary | ICD-10-CM

## 2017-08-23 DIAGNOSIS — I10 Essential (primary) hypertension: Secondary | ICD-10-CM

## 2017-08-23 DIAGNOSIS — E118 Type 2 diabetes mellitus with unspecified complications: Secondary | ICD-10-CM

## 2017-08-23 HISTORY — PX: FEMUR IM NAIL: SHX1597

## 2017-08-23 LAB — COMPREHENSIVE METABOLIC PANEL
ALT: 13 U/L — AB (ref 17–63)
AST: 20 U/L (ref 15–41)
Albumin: 3.2 g/dL — ABNORMAL LOW (ref 3.5–5.0)
Alkaline Phosphatase: 67 U/L (ref 38–126)
Anion gap: 8 (ref 5–15)
BUN: 11 mg/dL (ref 6–20)
CHLORIDE: 96 mmol/L — AB (ref 101–111)
CO2: 26 mmol/L (ref 22–32)
CREATININE: 0.72 mg/dL (ref 0.61–1.24)
Calcium: 8.6 mg/dL — ABNORMAL LOW (ref 8.9–10.3)
GFR calc non Af Amer: >60 mL/min (ref >60)
Glucose, Bld: 190 mg/dL — ABNORMAL HIGH (ref 65–99)
POTASSIUM: 4.4 mmol/L (ref 3.5–5.1)
SODIUM: 130 mmol/L — AB (ref 135–145)
Total Bilirubin: 0.9 mg/dL (ref 0.3–1.2)
Total Protein: 6 g/dL — ABNORMAL LOW (ref 6.5–8.1)

## 2017-08-23 LAB — CBC
HEMATOCRIT: 35.6 % — AB (ref 39.0–52.0)
Hemoglobin: 12 g/dL — ABNORMAL LOW (ref 13.0–17.0)
MCH: 30.3 pg (ref 26.0–34.0)
MCHC: 33.7 g/dL (ref 30.0–36.0)
MCV: 89.9 fL (ref 78.0–100.0)
Platelets: 164 10*3/uL (ref 150–400)
RBC: 3.96 MIL/uL — AB (ref 4.22–5.81)
RDW: 13.8 % (ref 11.5–15.5)
WBC: 11.8 10*3/uL — AB (ref 4.0–10.5)

## 2017-08-23 LAB — GLUCOSE, CAPILLARY
GLUCOSE-CAPILLARY: 275 mg/dL — AB (ref 65–99)
GLUCOSE-CAPILLARY: 306 mg/dL — AB (ref 65–99)
Glucose-Capillary: 198 mg/dL — ABNORMAL HIGH (ref 65–99)
Glucose-Capillary: 175 mg/dL — ABNORMAL HIGH (ref 65–99)
Glucose-Capillary: 246 mg/dL — ABNORMAL HIGH (ref 65–99)
Glucose-Capillary: 256 mg/dL — ABNORMAL HIGH (ref 65–99)

## 2017-08-23 SURGERY — INSERTION, INTRAMEDULLARY ROD, FEMUR
Anesthesia: General | Site: Hip | Laterality: Right

## 2017-08-23 MED ORDER — CEFAZOLIN SODIUM-DEXTROSE 2-4 GM/100ML-% IV SOLN
2.0000 g | INTRAVENOUS | Status: AC
Start: 2017-08-23 — End: 2017-08-23
  Administered 2017-08-23: 2 g via INTRAVENOUS
  Filled 2017-08-23: qty 100

## 2017-08-23 MED ORDER — ACETAMINOPHEN 650 MG RE SUPP: 650.0000 mg | Freq: Four times a day (QID) | RECTAL | Status: DC | PRN

## 2017-08-23 MED ORDER — PHENYLEPHRINE 40 MCG/ML (10ML) SYRINGE FOR IV PUSH (FOR BLOOD PRESSURE SUPPORT)
PREFILLED_SYRINGE | INTRAVENOUS | Status: DC | PRN
  Administered 2017-08-23: 80 ug via INTRAVENOUS
  Administered 2017-08-23: 40 ug via INTRAVENOUS

## 2017-08-23 MED ORDER — METHOCARBAMOL 500 MG PO TABS
500.0000 mg | ORAL_TABLET | Freq: Four times a day (QID) | ORAL | Status: DC | PRN
  Administered 2017-08-23 – 2017-08-26 (×7): 500 mg via ORAL
  Filled 2017-08-23 (×6): qty 1

## 2017-08-23 MED ORDER — SUGAMMADEX SODIUM 200 MG/2ML IV SOLN
INTRAVENOUS | Status: DC | PRN
  Administered 2017-08-23: 200 mg via INTRAVENOUS

## 2017-08-23 MED ORDER — METHOCARBAMOL 500 MG PO TABS
ORAL_TABLET | ORAL | Status: AC
  Filled 2017-08-23: qty 1

## 2017-08-23 MED ORDER — ROCURONIUM BROMIDE 100 MG/10ML IV SOLN
INTRAVENOUS | Status: DC | PRN
  Administered 2017-08-23: 30 mg via INTRAVENOUS

## 2017-08-23 MED ORDER — CHLORHEXIDINE GLUCONATE 4 % EX LIQD
60.0000 mL | Freq: Once | CUTANEOUS | Status: AC
  Administered 2017-08-23: 4 via TOPICAL

## 2017-08-23 MED ORDER — ASPIRIN EC 81 MG PO TBEC
81.0000 mg | DELAYED_RELEASE_TABLET | Freq: Two times a day (BID) | ORAL | 2 refills | Status: AC
Start: 2017-08-23 — End: 2017-10-04

## 2017-08-23 MED ORDER — NICOTINE 14 MG/24HR TD PT24
14.0000 mg | MEDICATED_PATCH | Freq: Every day | TRANSDERMAL | Status: DC
  Administered 2017-08-24 – 2017-08-26 (×3): 14 mg via TRANSDERMAL
  Filled 2017-08-23 (×3): qty 1

## 2017-08-23 MED ORDER — PROPOFOL 10 MG/ML IV BOLUS
INTRAVENOUS | Status: DC | PRN
  Administered 2017-08-23: 100 mg via INTRAVENOUS

## 2017-08-23 MED ORDER — ACETAMINOPHEN 325 MG PO TABS: 650.0000 mg | ORAL_TABLET | Freq: Four times a day (QID) | ORAL | Status: DC | PRN

## 2017-08-23 MED ORDER — SUCCINYLCHOLINE CHLORIDE 20 MG/ML IJ SOLN
INTRAMUSCULAR | Status: DC | PRN
  Administered 2017-08-23: 100 mg via INTRAVENOUS

## 2017-08-23 MED ORDER — CEFAZOLIN SODIUM-DEXTROSE 2-4 GM/100ML-% IV SOLN
2.0000 g | Freq: Four times a day (QID) | INTRAVENOUS | Status: AC
  Administered 2017-08-23 – 2017-08-24 (×3): 2 g via INTRAVENOUS
  Filled 2017-08-23 (×3): qty 100

## 2017-08-23 MED ORDER — DEXAMETHASONE SODIUM PHOSPHATE 10 MG/ML IJ SOLN
INTRAMUSCULAR | Status: AC
  Filled 2017-08-23: qty 1

## 2017-08-23 MED ORDER — ONDANSETRON HCL 4 MG/2ML IJ SOLN: 4.0000 mg | Freq: Four times a day (QID) | INTRAMUSCULAR | Status: DC | PRN

## 2017-08-23 MED ORDER — 0.9 % SODIUM CHLORIDE (POUR BTL) OPTIME
TOPICAL | Status: DC | PRN
Start: 2017-08-23 — End: 2017-08-23
  Administered 2017-08-23: 1000 mL

## 2017-08-23 MED ORDER — SUGAMMADEX SODIUM 200 MG/2ML IV SOLN
INTRAVENOUS | Status: AC
  Filled 2017-08-23: qty 2

## 2017-08-23 MED ORDER — DEXAMETHASONE SODIUM PHOSPHATE 10 MG/ML IJ SOLN
INTRAMUSCULAR | Status: DC | PRN
  Administered 2017-08-23: 5 mg via INTRAVENOUS

## 2017-08-23 MED ORDER — FENTANYL CITRATE (PF) 100 MCG/2ML IJ SOLN
INTRAMUSCULAR | Status: AC
  Administered 2017-08-23: 50 ug via INTRAVENOUS
  Filled 2017-08-23: qty 2

## 2017-08-23 MED ORDER — POVIDONE-IODINE 10 % EX SWAB: 2.0000 "application " | Freq: Once | CUTANEOUS | Status: DC

## 2017-08-23 MED ORDER — FENTANYL CITRATE (PF) 250 MCG/5ML IJ SOLN
INTRAMUSCULAR | Status: AC
  Filled 2017-08-23: qty 5

## 2017-08-23 MED ORDER — ONDANSETRON HCL 4 MG/2ML IJ SOLN: 4.0000 mg | Freq: Once | INTRAMUSCULAR | Status: DC | PRN

## 2017-08-23 MED ORDER — LIDOCAINE HCL (CARDIAC) 20 MG/ML IV SOLN
INTRAVENOUS | Status: DC | PRN
  Administered 2017-08-23: 100 mg via INTRAVENOUS

## 2017-08-23 MED ORDER — METOCLOPRAMIDE HCL 5 MG/ML IJ SOLN
5.0000 mg | Freq: Three times a day (TID) | INTRAMUSCULAR | Status: DC | PRN
  Administered 2017-08-23: 10 mg via INTRAVENOUS
  Filled 2017-08-23: qty 2

## 2017-08-23 MED ORDER — ONDANSETRON HCL 4 MG/2ML IJ SOLN
INTRAMUSCULAR | Status: AC
  Filled 2017-08-23: qty 2

## 2017-08-23 MED ORDER — FENTANYL CITRATE (PF) 100 MCG/2ML IJ SOLN
25.0000 ug | INTRAMUSCULAR | Status: DC | PRN
  Administered 2017-08-23 (×2): 50 ug via INTRAVENOUS

## 2017-08-23 MED ORDER — LACTATED RINGERS IV SOLN
INTRAVENOUS | Status: DC
  Administered 2017-08-23: 12:00:00 via INTRAVENOUS

## 2017-08-23 MED ORDER — ONDANSETRON HCL 4 MG PO TABS: 4.0000 mg | ORAL_TABLET | Freq: Four times a day (QID) | ORAL | Status: DC | PRN

## 2017-08-23 MED ORDER — PHENYLEPHRINE HCL 10 MG/ML IJ SOLN
INTRAVENOUS | Status: DC | PRN
  Administered 2017-08-23: 20 ug/min via INTRAVENOUS

## 2017-08-23 MED ORDER — METHOCARBAMOL 1000 MG/10ML IJ SOLN
500.0000 mg | Freq: Four times a day (QID) | INTRAMUSCULAR | Status: DC | PRN
  Filled 2017-08-23: qty 5

## 2017-08-23 MED ORDER — SODIUM CHLORIDE 0.9 % IV SOLN
INTRAVENOUS | Status: AC
  Administered 2017-08-23: 22:00:00 via INTRAVENOUS

## 2017-08-23 MED ORDER — HYDROCODONE-ACETAMINOPHEN 5-325 MG PO TABS: 1.0000 | ORAL_TABLET | Freq: Four times a day (QID) | ORAL | 0 refills | Status: DC | PRN

## 2017-08-23 MED ORDER — METOCLOPRAMIDE HCL 5 MG PO TABS: 5.0000 mg | ORAL_TABLET | Freq: Three times a day (TID) | ORAL | Status: DC | PRN

## 2017-08-23 MED ORDER — FENTANYL CITRATE (PF) 100 MCG/2ML IJ SOLN
INTRAMUSCULAR | Status: DC | PRN
  Administered 2017-08-23: 75 ug via INTRAVENOUS
  Administered 2017-08-23: 50 ug via INTRAVENOUS
  Administered 2017-08-23: 25 ug via INTRAVENOUS

## 2017-08-23 SURGICAL SUPPLY — 43 items
ALCOHOL 70% 16 OZ (MISCELLANEOUS) ×3 IMPLANT
BIT DRILL 4.2 (DRILL) ×1 IMPLANT
BNDG COHESIVE 4X5 TAN STRL (GAUZE/BANDAGES/DRESSINGS) ×3 IMPLANT
BNDG COHESIVE 6X5 TAN STRL LF (GAUZE/BANDAGES/DRESSINGS) ×3 IMPLANT
COVER PERINEAL POST (MISCELLANEOUS) ×3 IMPLANT
COVER SURGICAL LIGHT HANDLE (MISCELLANEOUS) ×3 IMPLANT
DRAPE C-ARMOR (DRAPES) ×3 IMPLANT
DRAPE HALF SHEET 40X57 (DRAPES) IMPLANT
DRAPE INCISE IOBAN 66X45 STRL (DRAPES) ×3 IMPLANT
DRAPE ORTHO SPLIT 77X108 STRL (DRAPES) ×4
DRAPE STERI IOBAN 125X83 (DRAPES) ×9 IMPLANT
DRAPE SURG ORHT 6 SPLT 77X108 (DRAPES) ×2 IMPLANT
DRILL 4.2 (DRILL) ×3
DRSG TEGADERM 2-3/8X2-3/4 SM (GAUZE/BANDAGES/DRESSINGS) IMPLANT
DRSG TEGADERM 4X4.75 (GAUZE/BANDAGES/DRESSINGS) ×6 IMPLANT
DURAPREP 26ML APPLICATOR (WOUND CARE) ×3 IMPLANT
ELECT REM PT RETURN 9FT ADLT (ELECTROSURGICAL) ×3
ELECTRODE REM PT RTRN 9FT ADLT (ELECTROSURGICAL) ×1 IMPLANT
FACESHIELD WRAPAROUND (MASK) IMPLANT
GAUZE SPONGE 4X4 12PLY STRL LF (GAUZE/BANDAGES/DRESSINGS) ×3 IMPLANT
GAUZE XEROFORM 1X8 LF (GAUZE/BANDAGES/DRESSINGS) ×3 IMPLANT
GLOVE BIO SURGEON STRL SZ7.5 (GLOVE) ×3 IMPLANT
GLOVE BIOGEL PI IND STRL 8 (GLOVE) ×1 IMPLANT
GLOVE BIOGEL PI INDICATOR 8 (GLOVE) ×2
GOWN STRL REUS W/ TWL LRG LVL3 (GOWN DISPOSABLE) ×2 IMPLANT
GOWN STRL REUS W/ TWL XL LVL3 (GOWN DISPOSABLE) ×1 IMPLANT
GOWN STRL REUS W/TWL LRG LVL3 (GOWN DISPOSABLE) ×4
GOWN STRL REUS W/TWL XL LVL3 (GOWN DISPOSABLE) ×2
GUIDEWIRE 3.2X400 (WIRE) ×3 IMPLANT
KIT BASIN OR (CUSTOM PROCEDURE TRAY) ×3 IMPLANT
KIT ROOM TURNOVER OR (KITS) ×3 IMPLANT
MANIFOLD NEPTUNE II (INSTRUMENTS) ×3 IMPLANT
NAIL TROCH FIX LNG 11X440RT (Nail) ×3 IMPLANT
NS IRRIG 1000ML POUR BTL (IV SOLUTION) ×3 IMPLANT
PACK GENERAL/GYN (CUSTOM PROCEDURE TRAY) ×3 IMPLANT
PAD ARMBOARD 7.5X6 YLW CONV (MISCELLANEOUS) ×6 IMPLANT
REAMER ROD DEEP FLUTE 2.5X950 (INSTRUMENTS) ×3 IMPLANT
SCREW LOCK 5.0MMX52NN (Screw) ×3 IMPLANT
SCREW TFNA 105MM STERILE (Screw) ×3 IMPLANT
SPONGE GAUZE 4X4 12PLY STER LF (GAUZE/BANDAGES/DRESSINGS) ×3 IMPLANT
STAPLER VISISTAT 35W (STAPLE) ×3 IMPLANT
SUT VIC AB 2-0 CT1 27 (SUTURE) ×2
SUT VIC AB 2-0 CT1 TAPERPNT 27 (SUTURE) ×1 IMPLANT

## 2017-08-23 NOTE — Consult Note (Signed)
ORTHOPAEDIC CONSULTATION  REQUESTING PHYSICIAN: Rosita Fire, MD  PCP:  Sinda Du, MD  Chief Complaint: right subtrochanteric femur fracture  HPI: Joshua Fletcher is a 81 y.o. male who complains of right hip and thigh pain. He sustained a fall yesterday morning around 3 AM. He presented to the emergency department and [place] and was found to have a right subtrochanteric femur fracture. They felt she was to medically complicated for anesthesia over the weekends he was referred down to our hospital for definitive management. I've been asked to consult regarding the orthopedic injury. At his baseline he walks without any assistive devices and is independent with all ADLs. Currently he denies any numbness tingling or otherwise in the right leg. He does have some baseline neuropathy in bilateral feet due to his diabetes.  Past Medical History:  Diagnosis Date  . Arthritis   . Constipation   . COPD (chronic obstructive pulmonary disease) ([place])   . Coronary artery disease   . Diabetes mellitus   . Hypertension   . Skin cancer of face    Past Surgical History:  Procedure Laterality Date  . APPENDECTOMY  1958  . BACK SURGERY    . CORONARY STENT PLACEMENT    . SKIN CANCER EXCISION  07/2017   Social History   Social History  . Marital status: Married    Spouse name: N/A  . Number of children: N/A  . Years of education: N/A   Social History Main Topics  . Smoking status: Current Every Day Smoker    Packs/day: 1.00    Years: 25.00    Types: Cigarettes  . Smokeless tobacco: Never Used  . Alcohol use No  . Drug use: No  . Sexual activity: Not Asked   Other Topics Concern  . None   Social History Narrative  . None   Family History  Problem Relation Age of Onset  . Diabetes Mother    No Known Allergies Prior to Admission medications   Medication Sig Start Date End Date Taking? Authorizing Provider  cholecalciferol (VITAMIN D) 1000 units tablet Take 5,000  Units by mouth daily.   Yes [provider]  gabapentin (NEURONTIN) 300 MG capsule Take 300 mg by mouth 2 (two) times daily.    Yes [provider]  lisinopril (PRINIVIL,ZESTRIL) 10 MG tablet Take 5 mg by mouth daily.    Yes [provider]  magnesium gluconate (MAGONATE) 500 MG tablet Take 500 mg by mouth daily.   Yes [provider]  metFORMIN (GLUCOPHAGE) 1000 MG tablet Take 1 tablet (1,000 mg total) by mouth 2 (two) times daily with a meal. 08/18/17  Yes Nida, Marella Chimes, MD  metoprolol (LOPRESSOR) 50 MG tablet Take 100 mg by mouth 2 (two) times daily.    Yes [provider]  Multiple Vitamin (MULTIVITAMIN WITH MINERALS) TABS tablet Take 1 tablet by mouth daily.   Yes [provider]  OVER THE COUNTER MEDICATION Take 1 tablet by mouth daily. ostelo bi flex triple strength   Yes [provider]  oxyCODONE-acetaminophen (PERCOCET) 10-325 MG tablet Take 1 tablet by mouth 5 (five) times daily as needed for pain.   Yes [provider]  Phenyleph-CPM-DM-Aspirin (ALKA-SELTZER PLUS COLD & COUGH PO) Take 2 tablets by mouth daily as needed (nasal congestion).   Yes [provider]  simvastatin (ZOCOR) 40 MG tablet Take 40 mg by mouth daily.   Yes [provider]  tamsulosin (FLOMAX) 0.4 MG CAPS capsule Take 0.4  mg by mouth.   Yes [provider]  trolamine salicylate (ASPERCREME) 10 % cream Apply 1 application topically as needed for muscle pain.   Yes [provider]   Dg Chest 1 View  Result Date: 08/22/2017 CLINICAL DATA:  Trip and fall.  Left hip pain. EXAM: CHEST 1 VIEW COMPARISON:  Radiographs 07/22/2011. Lung bases from abdominal CT 08/11/2012 FINDINGS: The heart is enlarged. There is bilateral hilar prominence, right greater than left. Atherosclerosis of the aortic arch. Diffuse interstitial coarsening with ill-defined reticular opacities in the periphery both lungs and at the left lung  base. No pneumothorax or confluent airspace disease. No pleural fluid. Surgical hardware in the lower cervical spine. No evidence of acute rib fracture. IMPRESSION: 1. Cardiomegaly with atherosclerotic aorta. Bilateral hilar prominence suggest pulmonary arterial hypertension. Adenopathy not entirely excluded. 2. Peripheral define reticular opacities throughout both lungs and the left lung base, suggesting interstitial lung disease and pulmonary fibrosis. Recommend chest CT characterization, high-resolution chest CT could be considered for interstitial lung disease evaluation. This has progressed from prior exams. Electronically Signed   By: Jeb Levering M.D.   On: 08/22/2017 05:25   Ct Head Wo Contrast  Result Date: 08/22/2017 CLINICAL DATA:  81 y/o  M; status post fall. EXAM: CT HEAD WITHOUT CONTRAST CT CERVICAL SPINE WITHOUT CONTRAST TECHNIQUE: Multidetector CT imaging of the head and cervical spine was performed following the standard protocol without intravenous contrast. Multiplanar CT image reconstructions of the cervical spine were also generated. COMPARISON:  None. FINDINGS: CT HEAD FINDINGS Brain: No evidence of acute infarction, hemorrhage, hydrocephalus, extra-axial collection or mass lesion/mass effect. Chronic right parietal cortical infarction. Small chronic lacunar infarctions within bilateral lentiform nuclei, left caudate head, and right thalamus. Moderate chronic microvascular ischemic changes and parenchymal volume loss of the brain. Vascular: Extensive calcific atherosclerosis of carotid siphons. Skull: Normal. Negative for fracture or focal lesion. Sinuses/Orbits: Paranasal sinus disease greatest in right maxillary sinus with there is moderate mucosal thickening and aerosolized secretions. Normal aeration of mastoid air cells. Bilateral intra-ocular lens replacement. Other: None. CT CERVICAL SPINE FINDINGS Alignment: Grade 1 C3-4 retrolisthesis, grade 1 C2-3 anterolisthesis, grade 1  C7-T1 anterolisthesis. Skull base and vertebrae: The C4-C6 anterior cervical discectomy solid interbody fusion. No acute fracture or loss of vertebral body height identified. 9 mm ossification within the right C6-7 ligamentum flavum. Soft tissues and spinal canal:  Negative. Disc levels: Severe adjacent segment disease at the C3-4 and C6-7 levels with loss of disc space height. Disc and facet disease contributes to at least moderate canal stenosis at C3-4 and C6-7 levels. Bilateral bony foraminal stenosis greatest at the C3-4 and C4-5 levels due to uncovertebral and facet hypertrophy. Upper chest: Negative. Other: The calcification of right palatine tonsil compatible sequelae prior infection. Low-attenuation mass within the right lateral oropharynx mucosa measuring 12 x 16 x 20 mm (AP x ML x CC series 8, image 39 and series 10, image 13). IMPRESSION: CT head: 1. No acute intracranial abnormality or calvarial fracture. 2. Small chronic right parietal cortical infarction. Small chronic lacunar infarcts in basal ganglia. 3. Moderate chronic microvascular ischemic changes and parenchymal volume loss of the brain. 4. Paranasal sinus disease with mucosal thickening and aerosolized secretions in the right maxillary sinus which may represent acute sinusitis. CT cervical spine: 1. No acute fracture or dislocation identified. 2. C4-C6 anterior cervical discectomy and fusion. 3. Cervical spondylosis with severe adjacent segment disease at the C3-4 and C6-7 levels where there is at least moderate canal  stenosis. 4. Low-attenuation well-circumscribed mass within the mucosa of right lateral oropharynx measuring up to 20 mm. Direct visualization recommended. Electronically Signed   By: Kristine Garbe M.D.   On: 08/22/2017 05:29   Ct Cervical Spine Wo Contrast  Result Date: 08/22/2017 CLINICAL DATA:  81 y/o  M; status post fall. EXAM: CT HEAD WITHOUT CONTRAST CT CERVICAL SPINE WITHOUT CONTRAST TECHNIQUE: Multidetector  CT imaging of the head and cervical spine was performed following the standard protocol without intravenous contrast. Multiplanar CT image reconstructions of the cervical spine were also generated. COMPARISON:  None. FINDINGS: CT HEAD FINDINGS Brain: No evidence of acute infarction, hemorrhage, hydrocephalus, extra-axial collection or mass lesion/mass effect. Chronic right parietal cortical infarction. Small chronic lacunar infarctions within bilateral lentiform nuclei, left caudate head, and right thalamus. Moderate chronic microvascular ischemic changes and parenchymal volume loss of the brain. Vascular: Extensive calcific atherosclerosis of carotid siphons. Skull: Normal. Negative for fracture or focal lesion. Sinuses/Orbits: Paranasal sinus disease greatest in right maxillary sinus with there is moderate mucosal thickening and aerosolized secretions. Normal aeration of mastoid air cells. Bilateral intra-ocular lens replacement. Other: None. CT CERVICAL SPINE FINDINGS Alignment: Grade 1 C3-4 retrolisthesis, grade 1 C2-3 anterolisthesis, grade 1 C7-T1 anterolisthesis. Skull base and vertebrae: The C4-C6 anterior cervical discectomy solid interbody fusion. No acute fracture or loss of vertebral body height identified. 9 mm ossification within the right C6-7 ligamentum flavum. Soft tissues and spinal canal:  Negative. Disc levels: Severe adjacent segment disease at the C3-4 and C6-7 levels with loss of disc space height. Disc and facet disease contributes to at least moderate canal stenosis at C3-4 and C6-7 levels. Bilateral bony foraminal stenosis greatest at the C3-4 and C4-5 levels due to uncovertebral and facet hypertrophy. Upper chest: Negative. Other: The calcification of right palatine tonsil compatible sequelae prior infection. Low-attenuation mass within the right lateral oropharynx mucosa measuring 12 x 16 x 20 mm (AP x ML x CC series 8, image 39 and series 10, image 13). IMPRESSION: CT head: 1. No acute  intracranial abnormality or calvarial fracture. 2. Small chronic right parietal cortical infarction. Small chronic lacunar infarcts in basal ganglia. 3. Moderate chronic microvascular ischemic changes and parenchymal volume loss of the brain. 4. Paranasal sinus disease with mucosal thickening and aerosolized secretions in the right maxillary sinus which may represent acute sinusitis. CT cervical spine: 1. No acute fracture or dislocation identified. 2. C4-C6 anterior cervical discectomy and fusion. 3. Cervical spondylosis with severe adjacent segment disease at the C3-4 and C6-7 levels where there is at least moderate canal stenosis. 4. Low-attenuation well-circumscribed mass within the mucosa of right lateral oropharynx measuring up to 20 mm. Direct visualization recommended. Electronically Signed   By: Kristine Garbe M.D.   On: 08/22/2017 05:29   Dg Hip Unilat W Or Wo Pelvis 2-3 Views Right  Result Date: 08/22/2017 CLINICAL DATA:  81 y/o M; status post fall with severe right hip pain. EXAM: DG HIP (WITH OR WITHOUT PELVIS) 2-3V RIGHT COMPARISON:  None. FINDINGS: The acute intertrochanteric fracture of the right proximal femur extending into the proximal diaphysis with apex lateral angulation and minimal displacement. No dislocation of the hip joint. No pelvic fracture or diastases identified. Vascular calcifications for IMPRESSION: Acute intertrochanteric fracture right proximal femur extending into proximal diaphysis with apex lateral angulation and minimal displacement. Electronically Signed   By: Kristine Garbe M.D.   On: 08/22/2017 05:20   Dg Femur [place] 2 Views Right  Result Date: 08/22/2017 CLINICAL DATA:  Known proximal right femoral fracture EXAM: RIGHT FEMUR PORTABLE 2 VIEW COMPARISON:  Pelvic film from earlier in the same day. FINDINGS: Comminuted intratrochanteric fracture is noted extending into the proximal metaphysis. Angulation at the fracture site is noted similar to  that seen on the prior exam. No distal fracture is seen. Degenerative changes at the knee joint are noted. IMPRESSION: Comminuted proximal right femoral fracture as described. Electronically Signed   By: Inez Catalina M.D.   On: 08/22/2017 08:03    Positive ROS: All other systems have been reviewed and were otherwise negative with the exception of those mentioned in the HPI and as above.  Physical Exam: General: Alert, no acute distress Cardiovascular: No pedal edema Respiratory: No cyanosis, no use of accessory musculature GI: No organomegaly, abdomen is soft and non-tender Skin: No lesions in the area of chief complaint Neurologic: Sensation intact distally Psychiatric: Patient is competent for consent with normal mood and affect Lymphatic: No axillary or cervical lymphadenopathy  MUSCULOSKELETAL:  Right lower extremity:  Leg held in external rotation mildly shortened. Tender to palpation along the hip and proximal thigh. Distally he has sensation intact to light touch in the deep and superficial peroneal nerve, sural nerve, saphenous nerve, and tibial nerve.Of note he has decreased sensation in the tibial and superficial peroneal due to neuropathy. Motor is intact to tibialis anterior, gastrocsoleus, flexor and extensor hallux longus. 2+ dorsalis pedis pulse.  Assessment: Right subtrochanteric femur fracture.  Plan: -to the operating room later today for operative fixation of the right femur. - He is nothing by mouth. - The risks, benefits, and alternatives were discussed with the patient. There are risks associated with the surgery including, but not limited to, problems with anesthesia (death), infection, differences in leg length/angulation/rotation, fracture of bones, loosening or failure of implants, malunion, nonunion, hematoma (blood accumulation) which may require surgical drainage, blood clots, pulmonary embolism, nerve injury (foot drop), and blood vessel injury. The patient  understands these risks and elects to proceed. - QUESTIONS were solicited and answered from he and his wife.    Nicholes Stairs, MD Cell [phone]    08/23/2017 8:14 AM

## 2017-08-23 NOTE — Anesthesia Preprocedure Evaluation (Signed)
Anesthesia Evaluation  Patient identified by MRN, date of birth, ID band Patient awake    Reviewed: Allergy & Precautions, NPO status , Patient's Chart, lab work & pertinent test results, reviewed documented beta blocker date and time   Airway Mallampati: II  TM Distance: <3 FB Neck ROM: Full    Dental  (+) Teeth Intact, Dental Advisory Given   Pulmonary COPD, Current Smoker,    Pulmonary exam normal breath sounds clear to auscultation       Cardiovascular hypertension, Pt. on home beta blockers and Pt. on medications + CAD, + Past MI and + Cardiac Stents  Normal cardiovascular exam Rhythm:Regular Rate:Normal     Neuro/Psych negative neurological ROS  negative psych ROS   GI/Hepatic negative GI ROS, Neg liver ROS,   Endo/Other  diabetes, Type 2, Oral Hypoglycemic Agents  Renal/GU negative Renal ROS     Musculoskeletal  (+) Arthritis , Osteoarthritis,    Abdominal   Peds  Hematology  (+) Blood dyscrasia, anemia ,   Anesthesia Other Findings Day of surgery medications reviewed with the patient.  Reproductive/Obstetrics                             Anesthesia Physical Anesthesia Plan  ASA: II  Anesthesia Plan: General   Post-op Pain Management:    Induction: Intravenous  PONV Risk Score and Plan: 1 and Ondansetron and Dexamethasone  Airway Management Planned: Oral ETT and Video Laryngoscope Planned  Additional Equipment:   Intra-op Plan:   Post-operative Plan: Extubation in OR  Informed Consent: I have reviewed the patients History and Physical, chart, labs and discussed the procedure including the risks, benefits and alternatives for the proposed anesthesia with the patient or authorized representative who has indicated his/her understanding and acceptance.   Dental advisory given  Plan Discussed with: CRNA  Anesthesia Plan Comments: (Risks/benefits of general anesthesia  discussed with patient including risk of damage to teeth, lips, gum, and tongue, nausea/vomiting, allergic reactions to medications, and the possibility of heart attack, stroke and death.  All patient questions answered.  Patient wishes to proceed.)        Anesthesia Quick Evaluation

## 2017-08-23 NOTE — Anesthesia Procedure Notes (Signed)
Procedure Name: Intubation Date/Time: 08/23/2017 12:32 PM Performed by: Rush Farmer E Pre-anesthesia Checklist: Patient identified, Emergency Drugs available, Suction available and Patient being monitored Patient Re-evaluated:Patient Re-evaluated prior to induction Oxygen Delivery Method: Circle system utilized Preoxygenation: Pre-oxygenation with 100% oxygen Induction Type: IV induction Ventilation: Mask ventilation without difficulty Laryngoscope Size: Mac and 4 Grade View: Grade I Tube type: Oral Tube size: 7.5 mm Number of attempts: 1 Airway Equipment and Method: Stylet Placement Confirmation: ETT inserted through vocal cords under direct vision,  positive ETCO2 and breath sounds checked- equal and bilateral Secured at: 22 cm Tube secured with: Tape Dental Injury: Teeth and Oropharynx as per pre-operative assessment

## 2017-08-23 NOTE — Transfer of Care (Signed)
Immediate Anesthesia Transfer of Care Note  Patient: [place]  Procedure(s) Performed: Procedure(s): INTRAMEDULLARY (IM) NAIL FEMORAL (Right)  Patient Location: PACU  Anesthesia Type:General  Level of Consciousness: awake, alert  and oriented  Airway & Oxygen Therapy: Patient Spontanous Breathing and Patient connected to face mask oxygen  Post-op Assessment: Report given to RN, Post -op Vital signs reviewed and stable and Patient moving all extremities  Post vital signs: Reviewed and stable  Last Vitals:  Vitals:   08/23/17 0739 08/23/17 1106  BP: (!) 169/68 (!) 155/81  Pulse: 80   Resp:    Temp:    SpO2:      Last Pain:  Vitals:   08/23/17 0915  TempSrc:   PainSc: 10-Worst pain ever      Patients Stated Pain Goal: 3 ([id number]/63 [id number])  Complications: No apparent anesthesia complications

## 2017-08-23 NOTE — Progress Notes (Addendum)
Pt  Has vomited twice this evening. He has received zofran and reglan. CBG 275 then 305. No hs coverage ordered. Hospitalist notified of elevated blood sugar and vomiting.  Order received to give zofran one hour early.

## 2017-08-23 NOTE — Op Note (Signed)
Date of Surgery: 08/23/2017  INDICATIONS: Joshua Fletcher is a 81 y.o.-year-old male who sustained a right Subtrochanteric femur fracture. Yesterday morning around 3 AM he got up to use the restroom and had a slip and fall onto the right hip.  He has a rather extensive pedicle comorbidity profile and per his family he has been having difficulty ambulating independently of late and has had some increasing falls.  He does however ambulate without any assistive devices, and is independent with ADLs.  He initially presented to the [facility] emergency department and was found to have the right sub-trochanteric femur fracture.  There they determined that his medical morbidities made him too complicated too proceed At their facility and he was transferred down to [facility] for definitive management.  The risks and benefits of the procedure discussed with the patient prior to the procedure and all questions were answered; consent was obtained.  We discussed the risks of bleeding, infection, damage to neurovascular structures, malunion, nonunion, painful hardware, need for future surgery, failure of hardware, developed a blood clots, the risk of anesthesia, and perioperative morbidity and mortality.  PREOPERATIVE DIAGNOSIS: right  Subtrochanteric hip fracture   POSTOPERATIVE DIAGNOSIS: Same   PROCEDURE: Treatment of intertrochanteric, pertrochanteric, subtrochanteric fracture with intramedullary implant. CPT [id number]   SURGEON: Dannielle Karvonen. Stann Mainland, M.D.   ANESTHESIA: general   IV FLUIDS AND URINE: See anesthesia record   ESTIMATED BLOOD LOSS: 150 cc  IMPLANTS:  Synthes TFN A 98mm x 440 mm 105 mm lag screw 5.0 x  52 mm distal interlock  DRAINS: None.   COMPLICATIONS: None.   DESCRIPTION OF PROCEDURE: The patient was brought to the operating room and placed supine on the operating table. The patient's leg had been signed prior to the procedure. The patient had the anesthesia placed by the  anesthesiologist. The prep verification and incision time-outs were performed to confirm that this was the correct patient, site, side and location. The patient had an SCD on the opposite lower extremity. The patient did receive antibiotics prior to the incision and was re-dosed during the procedure as needed at indicated intervals. The patient was positioned on the fracture table with the table in traction and internal rotation to reduce the fracture. The well leg was placed in a scissor position and all bony prominences were well-padded. The patient had the lower extremity prepped and draped in the standard surgical fashion.   The incision was made 4 finger breadths superior to the greater trochanter. A guide pin was inserted into the tip of the greater trochanter under fluoroscopic guidance. At this juncture we elected to address the flexion and external rotation deformity of the proximal segment.  The awl was introduced over the guidepin to help control the proximal segment.  Next we made a percutaneous incision along the proximal anterior thigh.  This did allow access to the proximal segment with a ball spiked pusher.  Utilizing the ball spike pusher we held the proximal segment in neutral position.  An opening reamer was used to gain access to the femoral canal.   After the canal was opened with the opening reamer, we passed a guide wire down to the distal femur.  The appropriate nail length was measured off this guide wire.  Next we began reaming.  We reamed up to a 13 mm reamer after hearing an initial chatter in the isthmus with a 12 mm reamer.  Once the 13 mm reamer was determined to be adequate we  elected to select a 11 mm diameter nail that measured 440 mm in length.Of note the proximal segment was held in anatomic alignment during sequential reaming and insertion of the nail.  The nail length was measured and inserted down the femoral canal to its proper depth. The appropriate version of insertion  for the lag screw was found under fluoroscopy. A pin was inserted up the femoral neck through the jig. The length of the lag screw was then measured. The lag screw was inserted as near to center-center in the head as possible. Next the locking mechanism was the point through the proximal aspect of the nail to create a fixed angle device with no sliding screw component.  We then turned our attention to the distal interlocking screw placement.  Utilizing perfect circle technique we localized 1 of the static distal holes.  Using a skin knife we accessed the lateral femur cortex, and dissected bluntly down with tonsil.  The drill was used with the assistance of fluoroscopy to drill bicortically through the static distal interlock hole.  Measuring device was used to measure off of the drill bit.  The appropriate 52 mm length distally locking screw was placed through the static hole by hand.  Final x-rays were obtained with the fluoroscopy.  These demonstrated appropriate position of the nail as well as length of the compression and distal interlocking screws.  His x-rays also demonstrated acceptable alignment through the subtrochanteric fracture site.   The wound was copiously irrigated with saline and the subcutaneous layer closed with 2.0 vicryl and the skin was reapproximated with staples. The wounds were cleaned and dried a final time and a sterile dressing was placed. The hip was taken through a range of motion at the end of the case under fluoroscopic imaging to visualize the approach-withdraw phenomenon and confirm implant length in the head. The patient was then awakened from anesthesia and taken to the recovery room in stable condition. All counts were correct at the end of the case.   POSTOPERATIVE PLAN: The patient will be Touchdown weight bearing and will return in 2 weeks for staple removal and the patient will receive DVT prophylaxis based on other medications, activity level, and risk ratio of  bleeding to thrombosis.  My recommendation is for twice a day aspirin for 4 weeks postoperatively, and transition to once daily aspirin for 2 weeks subsequent to that.   Geralynn Rile, [place] [phone] 9:37 PM

## 2017-08-23 NOTE — Discharge Instructions (Signed)
ORTHOPEDIC postop instructions:  - Foot touch weightbearing only to the right lower extremity. - Maintain postoperative dressings on right leg, and less than become saturated, and they may be changed for clean dry dressings as needed. - For prevention of blood clots take one baby aspirin twice daily for 6 weeks. - It is okay to shower with her postoperative bandages in place. - Return to the office of Dr. Stann Mainland for postoperative follow-up in 2 weeks.

## 2017-08-23 NOTE — Brief Op Note (Signed)
08/22/2017 - 08/23/2017  2:15 PM  PATIENT:  Joshua Fletcher  81 y.o. male  PRE-OPERATIVE DIAGNOSIS:  Right Subtrochanteric Femur Fracture  POST-OPERATIVE DIAGNOSIS:  Right Subtrochanteric Femur Fracture  PROCEDURE:  Procedure(s): INTRAMEDULLARY (IM) NAIL FEMORAL (Right)  SURGEON:  Surgeon(s) and Role:    * Nicholes Stairs, MD - Primary  PHYSICIAN ASSISTANT:   ASSISTANTS: none   ANESTHESIA:   general  EBL:  Total I/O In: -  Out: 550 [Urine:400; Blood:150]  BLOOD ADMINISTERED:none  DRAINS: none   LOCAL MEDICATIONS USED:  NONE  SPECIMEN:  No Specimen  DISPOSITION OF SPECIMEN:  N/A  COUNTS:  YES  TOURNIQUET:  * No tourniquets in log *  DICTATION: .Note written in EPIC  PLAN OF CARE: Admit to inpatient   PATIENT DISPOSITION:  PACU - hemodynamically stable.   Delay start of Pharmacological VTE agent (>24hrs) due to surgical blood loss or risk of bleeding: not applicable

## 2017-08-23 NOTE — Anesthesia Postprocedure Evaluation (Signed)
Anesthesia Post Note  Patient: Joshua Fletcher  Procedure(s) Performed: Procedure(s) (LRB): INTRAMEDULLARY (IM) NAIL FEMORAL (Right)     Patient location during evaluation: PACU Anesthesia Type: General Level of consciousness: awake and alert Pain management: pain level not controlled Vital Signs Assessment: post-procedure vital signs reviewed and stable Respiratory status: spontaneous breathing, nonlabored ventilation and respiratory function stable Cardiovascular status: blood pressure returned to baseline and stable Postop Assessment: no apparent nausea or vomiting Anesthetic complications: no    Last Vitals:  Vitals:   08/23/17 1542 08/23/17 1543  BP: (!) 96/59   Pulse:  82  Resp: 20 (!) 22  Temp:  (!) 36.3 C  SpO2:  95%    Last Pain:  Vitals:   08/23/17 0915  TempSrc:   PainSc: 10-Worst pain ever                 Catalina Gravel

## 2017-08-23 NOTE — Progress Notes (Signed)
PROGRESS NOTE    Joshua Fletcher  ELF:[id number] DOB: 1936-02-08 DOA: 08/22/2017 PCP: Sinda Du, MD   Brief Narrative: 81 year old male active smoker, COPD not on oxygen, diabetes, coronary artery disease, hypertension presented after a fall sustaining right hip fracture. Patient was transferred from [facility] per consultants evaluation and surgical intervention.  Assessment & Plan:  #  Fall sustaining comminuted proximal right femoral fracture: -Patient was evaluated by orthopedics and currently nothing by mouth for surgical intervention. Patient will be going to or shortly. Reports his leg pain is controlled. -Continue pain management, supportive care and follow-up post surgery. -PT OT evaluation  Patient with history of hypertension, diabetes and coronary artery disease. He has diabetic neuropathy therefore has a difficulty walking. He is able to walk without shortness of breath on climbing stairs. Does not have chest pain. EKG was sinus rhythm and right bundle branch block. No prior EKG to compare. No echocardiogram available. Patient reported that he had a cardiac stress test about 4-5 years ago which was normal. Patient is at intermediate cardiac risk for moderate risk surgery. I do not think patient needs cardiac workup before urgent nature of orthopedic surgery.  #COPD and active smoker: Chest x-ray consistent with COPD and interstitial lung disease. Continue bronchodilators. Continue supportive care. Recommended to follow-up with the pulmonologist outpatient. Discussed with the patient and family members regarding mechanical ventilator and pulmonary complication postsurgery including possibility of prolonged intubation. They verbalized understanding.  #Type 2 diabetes: Continue sliding scale. Monitor blood sugar level  #Essential hypertension: Monitor blood pressure. Continue lisinopril, metoprolol  #Mass of the oropharynx: Anesthesia evaluation before surgery. Recommended to follow-up  with ENT outpatient. Patient denied any throat pain or problems swallowing.  #Coronary artery disease: Stable.  #Hyponatremia: Monitor BMP. Check urine lites and urine sodium.  DVT prophylaxis: Heparin subcutaneous, per Ortho Code Status: Full code Family Communication: Discussed with the patient's wife and daughter at bedside Disposition Plan: Currently admitted    Consultants:   Orthopedics  Procedures: None Antimicrobials: None  Subjective: Patient was seen and examined at bedside. Denied headache, dizziness, nausea vomiting. Pain is controlled. Going to or today.  Objective: Vitals:   08/22/17 2124 08/23/17 0457 08/23/17 0739 08/23/17 1106  BP: (!) 161/85 (!) 192/81 (!) 169/68 (!) 155/81  Pulse:  78 80   Resp:  (!) 21    Temp:  98.6 F (37 C)    TempSrc:  Oral    SpO2:  98%    Weight:      Height:        Intake/Output Summary (Last 24 hours) at 08/23/17 1342 Last data filed at 08/23/17 0914  Gross per 24 hour  Intake          1133.75 ml  Output             1350 ml  Net          -216.25 ml   Filed Weights   08/22/17 0353  Weight: 83 kg (183 lb)    Examination:  General exam: Appears calm and comfortable  Respiratory system: Clear to auscultation. Respiratory effort normal. No wheezing or crackle Cardiovascular system: S1 & S2 heard, RRR.  No pedal edema. Gastrointestinal system: Abdomen is nondistended, soft and nontender. Normal bowel sounds heard. Central nervous system: Alert and oriented. No focal neurological deficits. Skin: No rashes, lesions or ulcers Psychiatry: Judgement and insight appear normal. Mood & affect appropriate.     Data Reviewed: I have personally reviewed following labs and imaging  studies  CBC:  Recent Labs Lab 08/22/17 0459 08/22/17 1220 08/23/17 0553  WBC 11.0* 9.8 11.8*  NEUTROABS 8.2*  --   --   HGB 13.7 13.7 12.0*  HCT 41.0 40.9 35.6*  MCV 92.6 90.3 89.9  PLT 146* 146* 784   Basic Metabolic Panel:  Recent  Labs Lab 08/22/17 0459 08/22/17 1220 08/23/17 0553  NA 132*  --  130*  K 4.0  --  4.4  CL 94*  --  96*  CO2 29  --  26  GLUCOSE 196*  --  190*  BUN 18  --  11  CREATININE 0.88 0.98 0.72  CALCIUM 8.7*  --  8.6*   GFR: Estimated Creatinine Clearance: 83.5 mL/min (by C-G formula based on SCr of 0.72 mg/dL). Liver Function Tests:  Recent Labs Lab 08/23/17 0553  AST 20  ALT 13*  ALKPHOS 67  BILITOT 0.9  PROT 6.0*  ALBUMIN 3.2*   No results for input(s): LIPASE, AMYLASE in the last 168 hours. No results for input(s): AMMONIA in the last 168 hours. Coagulation Profile: No results for input(s): INR, PROTIME in the last 168 hours. Cardiac Enzymes: No results for input(s): CKTOTAL, CKMB, CKMBINDEX, TROPONINI in the last 168 hours. BNP (last 3 results) No results for input(s): PROBNP in the last 8760 hours. HbA1C: No results for input(s): HGBA1C in the last 72 hours. CBG:  Recent Labs Lab 08/22/17 0730 08/22/17 1016 08/22/17 1641 08/23/17 0634 08/23/17 1102  GLUCAP 196* 193* 231* 198* 175*   Lipid Profile: No results for input(s): CHOL, HDL, LDLCALC, TRIG, CHOLHDL, LDLDIRECT in the last 72 hours. Thyroid Function Tests: No results for input(s): TSH, T4TOTAL, FREET4, T3FREE, THYROIDAB in the last 72 hours. Anemia Panel: No results for input(s): VITAMINB12, FOLATE, FERRITIN, TIBC, IRON, RETICCTPCT in the last 72 hours. Sepsis Labs: No results for input(s): PROCALCITON, LATICACIDVEN in the last 168 hours.  Recent Results (from the past 240 hour(s))  Surgical pcr screen     Status: None   Collection Time: 08/22/17  9:19 PM  Result Value Ref Range Status   MRSA, PCR NEGATIVE NEGATIVE Final   Staphylococcus aureus NEGATIVE NEGATIVE Final    Comment: (NOTE) The Xpert SA Assay (FDA approved for NASAL specimens in patients 62 years of age and older), is one component of a comprehensive surveillance program. It is not intended to diagnose infection nor to guide or  monitor treatment.          Radiology Studies: Dg Chest 1 View  Result Date: 08/22/2017 CLINICAL DATA:  Trip and fall.  Left hip pain. EXAM: CHEST 1 VIEW COMPARISON:  Radiographs 07/22/2011. Lung bases from abdominal CT 08/11/2012 FINDINGS: The heart is enlarged. There is bilateral hilar prominence, right greater than left. Atherosclerosis of the aortic arch. Diffuse interstitial coarsening with ill-defined reticular opacities in the periphery both lungs and at the left lung base. No pneumothorax or confluent airspace disease. No pleural fluid. Surgical hardware in the lower cervical spine. No evidence of acute rib fracture. IMPRESSION: 1. Cardiomegaly with atherosclerotic aorta. Bilateral hilar prominence suggest pulmonary arterial hypertension. Adenopathy not entirely excluded. 2. Peripheral define reticular opacities throughout both lungs and the left lung base, suggesting interstitial lung disease and pulmonary fibrosis. Recommend chest CT characterization, high-resolution chest CT could be considered for interstitial lung disease evaluation. This has progressed from prior exams. Electronically Signed   By: Jeb Levering M.D.   On: 08/22/2017 05:25   Ct Head Wo Contrast  Result Date: 08/22/2017 CLINICAL  DATA:  81 y/o  M; status post fall. EXAM: CT HEAD WITHOUT CONTRAST CT CERVICAL SPINE WITHOUT CONTRAST TECHNIQUE: Multidetector CT imaging of the head and cervical spine was performed following the standard protocol without intravenous contrast. Multiplanar CT image reconstructions of the cervical spine were also generated. COMPARISON:  None. FINDINGS: CT HEAD FINDINGS Brain: No evidence of acute infarction, hemorrhage, hydrocephalus, extra-axial collection or mass lesion/mass effect. Chronic right parietal cortical infarction. Small chronic lacunar infarctions within bilateral lentiform nuclei, left caudate head, and right thalamus. Moderate chronic microvascular ischemic changes and parenchymal  volume loss of the brain. Vascular: Extensive calcific atherosclerosis of carotid siphons. Skull: Normal. Negative for fracture or focal lesion. Sinuses/Orbits: Paranasal sinus disease greatest in right maxillary sinus with there is moderate mucosal thickening and aerosolized secretions. Normal aeration of mastoid air cells. Bilateral intra-ocular lens replacement. Other: None. CT CERVICAL SPINE FINDINGS Alignment: Grade 1 C3-4 retrolisthesis, grade 1 C2-3 anterolisthesis, grade 1 C7-T1 anterolisthesis. Skull base and vertebrae: The C4-C6 anterior cervical discectomy solid interbody fusion. No acute fracture or loss of vertebral body height identified. 9 mm ossification within the right C6-7 ligamentum flavum. Soft tissues and spinal canal:  Negative. Disc levels: Severe adjacent segment disease at the C3-4 and C6-7 levels with loss of disc space height. Disc and facet disease contributes to at least moderate canal stenosis at C3-4 and C6-7 levels. Bilateral bony foraminal stenosis greatest at the C3-4 and C4-5 levels due to uncovertebral and facet hypertrophy. Upper chest: Negative. Other: The calcification of right palatine tonsil compatible sequelae prior infection. Low-attenuation mass within the right lateral oropharynx mucosa measuring 12 x 16 x 20 mm (AP x ML x CC series 8, image 39 and series 10, image 13). IMPRESSION: CT head: 1. No acute intracranial abnormality or calvarial fracture. 2. Small chronic right parietal cortical infarction. Small chronic lacunar infarcts in basal ganglia. 3. Moderate chronic microvascular ischemic changes and parenchymal volume loss of the brain. 4. Paranasal sinus disease with mucosal thickening and aerosolized secretions in the right maxillary sinus which may represent acute sinusitis. CT cervical spine: 1. No acute fracture or dislocation identified. 2. C4-C6 anterior cervical discectomy and fusion. 3. Cervical spondylosis with severe adjacent segment disease at the C3-4  and C6-7 levels where there is at least moderate canal stenosis. 4. Low-attenuation well-circumscribed mass within the mucosa of right lateral oropharynx measuring up to 20 mm. Direct visualization recommended. Electronically Signed   By: Kristine Garbe M.D.   On: 08/22/2017 05:29   Ct Cervical Spine Wo Contrast  Result Date: 08/22/2017 CLINICAL DATA:  81 y/o  M; status post fall. EXAM: CT HEAD WITHOUT CONTRAST CT CERVICAL SPINE WITHOUT CONTRAST TECHNIQUE: Multidetector CT imaging of the head and cervical spine was performed following the standard protocol without intravenous contrast. Multiplanar CT image reconstructions of the cervical spine were also generated. COMPARISON:  None. FINDINGS: CT HEAD FINDINGS Brain: No evidence of acute infarction, hemorrhage, hydrocephalus, extra-axial collection or mass lesion/mass effect. Chronic right parietal cortical infarction. Small chronic lacunar infarctions within bilateral lentiform nuclei, left caudate head, and right thalamus. Moderate chronic microvascular ischemic changes and parenchymal volume loss of the brain. Vascular: Extensive calcific atherosclerosis of carotid siphons. Skull: Normal. Negative for fracture or focal lesion. Sinuses/Orbits: Paranasal sinus disease greatest in right maxillary sinus with there is moderate mucosal thickening and aerosolized secretions. Normal aeration of mastoid air cells. Bilateral intra-ocular lens replacement. Other: None. CT CERVICAL SPINE FINDINGS Alignment: Grade 1 C3-4 retrolisthesis, grade 1 C2-3 anterolisthesis, grade 1  C7-T1 anterolisthesis. Skull base and vertebrae: The C4-C6 anterior cervical discectomy solid interbody fusion. No acute fracture or loss of vertebral body height identified. 9 mm ossification within the right C6-7 ligamentum flavum. Soft tissues and spinal canal:  Negative. Disc levels: Severe adjacent segment disease at the C3-4 and C6-7 levels with loss of disc space height. Disc and facet  disease contributes to at least moderate canal stenosis at C3-4 and C6-7 levels. Bilateral bony foraminal stenosis greatest at the C3-4 and C4-5 levels due to uncovertebral and facet hypertrophy. Upper chest: Negative. Other: The calcification of right palatine tonsil compatible sequelae prior infection. Low-attenuation mass within the right lateral oropharynx mucosa measuring 12 x 16 x 20 mm (AP x ML x CC series 8, image 39 and series 10, image 13). IMPRESSION: CT head: 1. No acute intracranial abnormality or calvarial fracture. 2. Small chronic right parietal cortical infarction. Small chronic lacunar infarcts in basal ganglia. 3. Moderate chronic microvascular ischemic changes and parenchymal volume loss of the brain. 4. Paranasal sinus disease with mucosal thickening and aerosolized secretions in the right maxillary sinus which may represent acute sinusitis. CT cervical spine: 1. No acute fracture or dislocation identified. 2. C4-C6 anterior cervical discectomy and fusion. 3. Cervical spondylosis with severe adjacent segment disease at the C3-4 and C6-7 levels where there is at least moderate canal stenosis. 4. Low-attenuation well-circumscribed mass within the mucosa of right lateral oropharynx measuring up to 20 mm. Direct visualization recommended. Electronically Signed   By: Kristine Garbe M.D.   On: 08/22/2017 05:29   Dg Hip Unilat W Or Wo Pelvis 2-3 Views Right  Result Date: 08/22/2017 CLINICAL DATA:  81 y/o M; status post fall with severe right hip pain. EXAM: DG HIP (WITH OR WITHOUT PELVIS) 2-3V RIGHT COMPARISON:  None. FINDINGS: The acute intertrochanteric fracture of the right proximal femur extending into the proximal diaphysis with apex lateral angulation and minimal displacement. No dislocation of the hip joint. No pelvic fracture or diastases identified. Vascular calcifications for IMPRESSION: Acute intertrochanteric fracture right proximal femur extending into proximal diaphysis with  apex lateral angulation and minimal displacement. Electronically Signed   By: Kristine Garbe M.D.   On: 08/22/2017 05:20   Dg Femur Port, Min 2 Views Right  Result Date: 08/22/2017 CLINICAL DATA:  Known proximal right femoral fracture EXAM: RIGHT FEMUR PORTABLE 2 VIEW COMPARISON:  Pelvic film from earlier in the same day. FINDINGS: Comminuted intratrochanteric fracture is noted extending into the proximal metaphysis. Angulation at the fracture site is noted similar to that seen on the prior exam. No distal fracture is seen. Degenerative changes at the knee joint are noted. IMPRESSION: Comminuted proximal right femoral fracture as described. Electronically Signed   By: Inez Catalina M.D.   On: 08/22/2017 08:03        Scheduled Meds: . [MAR Hold] gabapentin  300 mg Oral QHS  . [MAR Hold] heparin  5,000 Units Subcutaneous Q8H  . [MAR Hold] insulin aspart  0-9 Units Subcutaneous TID WC  . [MAR Hold] lisinopril  10 mg Oral Daily  . [MAR Hold] metoprolol tartrate  100 mg Oral BID  . povidone-iodine  2 application Topical Once  . [MAR Hold] simvastatin  40 mg Oral Daily  . [MAR Hold] tamsulosin  0.4 mg Oral QPC supper   Continuous Infusions: . lactated ringers 10 mL/hr at 08/23/17 1141     LOS: 1 day    Dron Tanna Furry, MD [facility] Pager [phone]  If 7PM-7AM, please contact  night-coverage [email or web address] Password [email or web address] 08/23/2017, 1:42 PM

## 2017-08-24 ENCOUNTER — Encounter (HOSPITAL_COMMUNITY): Payer: Self-pay | Admitting: Orthopedic Surgery

## 2017-08-24 ENCOUNTER — Other Ambulatory Visit (HOSPITAL_COMMUNITY): Payer: Medicare HMO

## 2017-08-24 DIAGNOSIS — S7221XA Displaced subtrochanteric fracture of right femur, initial encounter for closed fracture: Principal | ICD-10-CM

## 2017-08-24 DIAGNOSIS — N179 Acute kidney failure, unspecified: Secondary | ICD-10-CM

## 2017-08-24 LAB — CBC
HCT: 26.3 % — ABNORMAL LOW (ref 39.0–52.0)
HEMOGLOBIN: 8.8 g/dL — AB (ref 13.0–17.0)
MCH: 30.4 pg (ref 26.0–34.0)
MCHC: 33.5 g/dL (ref 30.0–36.0)
MCV: 91 fL (ref 78.0–100.0)
PLATELETS: 171 10*3/uL (ref 150–400)
RBC: 2.89 MIL/uL — AB (ref 4.22–5.81)
RDW: 14.1 % (ref 11.5–15.5)
WBC: 16.2 10*3/uL — AB (ref 4.0–10.5)

## 2017-08-24 LAB — URINALYSIS, ROUTINE W REFLEX MICROSCOPIC
BACTERIA UA: NONE SEEN
Bilirubin Urine: NEGATIVE
GLUCOSE, UA: NEGATIVE mg/dL
Ketones, ur: 5 mg/dL — AB
LEUKOCYTES UA: NEGATIVE
NITRITE: NEGATIVE
PH: 5 (ref 5.0–8.0)
Protein, ur: 30 mg/dL — AB
SPECIFIC GRAVITY, URINE: 1.026 (ref 1.005–1.030)

## 2017-08-24 LAB — BASIC METABOLIC PANEL
ANION GAP: 13 (ref 5–15)
BUN: 36 mg/dL — AB (ref 6–20)
CHLORIDE: 98 mmol/L — AB (ref 101–111)
CO2: 19 mmol/L — ABNORMAL LOW (ref 22–32)
Calcium: 8.2 mg/dL — ABNORMAL LOW (ref 8.9–10.3)
Creatinine, Ser: 2.18 mg/dL — ABNORMAL HIGH (ref 0.61–1.24)
GFR, EST AFRICAN AMERICAN: 32 mL/min — AB (ref >60)
GFR, EST NON AFRICAN AMERICAN: 27 mL/min — AB (ref >60)
Glucose, Bld: 282 mg/dL — ABNORMAL HIGH (ref 65–99)
POTASSIUM: 4 mmol/L (ref 3.5–5.1)
SODIUM: 130 mmol/L — AB (ref 135–145)

## 2017-08-24 LAB — NA AND K (SODIUM & POTASSIUM), RAND UR
Potassium Urine: 62 mmol/L
SODIUM UR: 84 mmol/L

## 2017-08-24 LAB — GLUCOSE, CAPILLARY
GLUCOSE-CAPILLARY: 218 mg/dL — AB (ref 65–99)
GLUCOSE-CAPILLARY: 267 mg/dL — AB (ref 65–99)
GLUCOSE-CAPILLARY: 313 mg/dL — AB (ref 65–99)
Glucose-Capillary: 184 mg/dL — ABNORMAL HIGH (ref 65–99)
Glucose-Capillary: 277 mg/dL — ABNORMAL HIGH (ref 65–99)

## 2017-08-24 LAB — OSMOLALITY, URINE: Osmolality, Ur: 413 mOsm/kg (ref 300–900)

## 2017-08-24 MED ORDER — SODIUM CHLORIDE 0.9 % IV SOLN
INTRAVENOUS | Status: DC
  Administered 2017-08-24: 17:00:00 via INTRAVENOUS

## 2017-08-24 NOTE — Progress Notes (Signed)
RN called RT to assess patient. Patient's SpO2 drops down to 85% due to patient breathing through mouth.  RT changed patient from 4L [place] to 4L venturi mask and SPO2 improved to 92%. Patients breath sounds are coarse but no wheezing. Patient is resting comfortably at this time. RT did not administer breathing treatment at this time. RT will monitor as needed.

## 2017-08-24 NOTE — Evaluation (Signed)
Physical Therapy Evaluation Patient Details Name: Joshua Fletcher MRN: [id number] DOB: Dec 16, 1935 Today's Date: 08/24/2017   History of Present Illness  81 y.o.malewith multiple medical problems detailed below including diabetes, coronary artery disease and hypertension who presented to the emergency department with a painful right hip after a fall. Underwent IM nail placement on 08/23/17 and is TDWB.  Clinical Impression  Pt admitted with above diagnosis. Pt currently with functional limitations due to the deficits listed below (see PT Problem List). On eval, pt required +2 mod assist bed mobility and sit to stand. Pt transferred to the recliner with nursing assist this AM. Maximove had to be utilized for transfer back to bed. Pt will benefit from skilled PT to increase their independence and safety with mobility to allow discharge to the venue listed below.       Follow Up Recommendations SNF    Equipment Recommendations  Other (comment) (defer to next venue)    Recommendations for Other Services       Precautions / Restrictions Precautions Precautions: Fall Restrictions Weight Bearing Restrictions: Yes RLE Weight Bearing: Touchdown weight bearing      Mobility  Bed Mobility Overal bed mobility: Needs Assistance Bed Mobility: Supine to Sit;Sit to Supine     Supine to sit: +2 for physical assistance;Mod assist Sit to supine: Mod assist   General bed mobility comments: +rail, verbal cues for sequencing, increased time and effort  Transfers Overall transfer level: Needs assistance Equipment used: Rolling walker (2 wheeled) Transfers: Sit to/from Stand Sit to Stand: +2 physical assistance;Mod assist         General transfer comment: Pt unable to attain full upright stance. Flexion noted at trunk, hip and knees. Family reports pt ambulated in a flexed position PTA. Pt only able to stand ~ 30 seconds. Pt able to maintain TDWB in static standing with RW.  Ambulation/Gait              General Gait Details: unable  Stairs            Wheelchair Mobility    Modified Rankin (Stroke Patients Only)       Balance Overall balance assessment: Needs assistance Sitting-balance support: Single extremity supported;Feet supported Sitting balance-Leahy Scale: Poor     Standing balance support: Bilateral upper extremity supported;During functional activity Standing balance-Leahy Scale: Zero Standing balance comment: heavy reliance on RW and therapist assist                             Pertinent Vitals/Pain Pain Assessment: Faces Faces Pain Scale: Hurts even more Pain Location: RLE with mobility Pain Descriptors / Indicators: Grimacing;Moaning;Operative site guarding;Sore Pain Intervention(s): Limited activity within patient's tolerance;Monitored during session;Repositioned    Home Living Family/patient expects to be discharged to:: Skilled nursing facility Living Arrangements: Spouse/significant other Available Help at Discharge: Family;Available 24 hours/day Type of Home: House       Home Layout: One level Home Equipment: [place] - 2 wheels;Cane - single point;Shower seat;Grab bars - tub/shower;Bedside commode      Prior Function Level of Independence: Independent   Gait / Transfers Assistance Needed: Shuffles when he walks. No use of DME  ADL's / Homemaking Assistance Needed: Wife performing IADLs. Pt able to perform dressing, bathing, and making simple meals (pbj sandwhich and chocolate milk)  Comments: ~4 fall this year     Hand Dominance   Dominant Hand: Right    Extremity/Trunk Assessment   Upper Extremity  Assessment Upper Extremity Assessment: Defer to OT evaluation    Lower Extremity Assessment Lower Extremity Assessment: RLE deficits/detail RLE: Unable to fully assess due to pain    Cervical / Trunk Assessment Cervical / Trunk Assessment: Kyphotic  Communication   Communication: No difficulties   Cognition Arousal/Alertness: Lethargic;Suspect due to medications Behavior During Therapy: [facility] for tasks assessed/performed Overall Cognitive Status: Within Functional Limits for tasks assessed                                        General Comments      Exercises     Assessment/Plan    PT Assessment Patient needs continued PT services  PT Problem List Decreased strength;Decreased activity tolerance;Decreased balance;Decreased knowledge of use of DME;Decreased mobility;Decreased knowledge of precautions;Pain       PT Treatment Interventions DME instruction;Gait training;Functional mobility training;Balance training;Therapeutic exercise;Therapeutic activities;Patient/family education    PT Goals (Current goals can be found in the Care Plan section)  Acute Rehab PT Goals Patient Stated Goal: not stated PT Goal Formulation: With patient/family Time For Goal Achievement: 09/07/17 Potential to Achieve Goals: Good    Frequency Min 3X/week   Barriers to discharge        Co-evaluation PT/OT/SLP Co-Evaluation/Treatment: Yes Reason for Co-Treatment: For patient/therapist safety PT goals addressed during session: Mobility/safety with mobility;Balance         AM-PAC PT "6 Clicks" Daily Activity  Outcome Measure Difficulty turning over in bed (including adjusting bedclothes, sheets and blankets)?: Unable Difficulty moving from lying on back to sitting on the side of the bed? : Unable Difficulty sitting down on and standing up from a chair with arms (e.g., wheelchair, bedside commode, etc,.)?: Unable Help needed moving to and from a bed to chair (including a wheelchair)?: A Lot Help needed walking in hospital room?: Total Help needed climbing 3-5 steps with a railing? : Total 6 Click Score: 7    End of Session Equipment Utilized During Treatment: Gait belt;Oxygen Activity Tolerance: Patient tolerated treatment well Patient left: in bed;with bed alarm  set;with call bell/phone within reach;with family/visitor present Nurse Communication: Mobility status;Need for lift equipment PT Visit Diagnosis: Other abnormalities of gait and mobility (R26.89);Muscle weakness (generalized) (M62.81);Pain Pain - Right/Left: Right Pain - part of body: Hip    Time: 8850-2774 PT Time Calculation (min) (ACUTE ONLY): 28 min   Charges:   PT Evaluation $PT Eval Moderate Complexity: 1 Mod     PT G Codes:        Lorrin Goodell, PT  Office # [phone] Pager [phone]   Lorriane Shire 08/24/2017, 10:34 AM

## 2017-08-24 NOTE — Progress Notes (Signed)
Patient ID: Joshua Fletcher, male   DOB: 10-Sep-1936, 81 y.o.   MRN: [id number] Subjective: 1 Day Post-Op Procedure(s) (LRB): INTRAMEDULLARY (IM) NAIL FEMORAL (Right)    Patient reports pain as moderate to severe.  Daughter in room this am.  Wishes to discuss DNR status with medical team.  Apparently no interest in full code status but would like to discuss options. No events over night other than pain.  Objective:   VITALS:   Vitals:   08/24/17 0500 08/24/17 0523  BP:    Pulse: 95 97  Resp: 18   Temp:    SpO2: 95% 95%    Neurovascular intact Incision: dressing C/D/I, right thigh incisions  LABS  Recent Labs  08/22/17 1220 08/23/17 0553 08/24/17 0518  HGB 13.7 12.0* 8.8*  HCT 40.9 35.6* 26.3*  WBC 9.8 11.8* 16.2*  PLT 146* 164 171     Recent Labs  08/22/17 0459 08/22/17 1220 08/23/17 0553 08/24/17 0518  NA 132*  --  130* 130*  K 4.0  --  4.4 4.0  BUN 18  --  11 36*  CREATININE 0.88 0.98 0.72 2.18*  GLUCOSE 196*  --  190* 282*    No results for input(s): LABPT, INR in the last 72 hours.   Assessment/Plan: 1 Day Post-Op Procedure(s) (LRB): INTRAMEDULLARY (IM) NAIL FEMORAL (Right)   Advance diet Up with therapy Discharge to SNF when medical stable Medical team to review DNR levels and status with family Noted by his daughter to smoke and fairly deconditioned pre-operatively

## 2017-08-24 NOTE — Progress Notes (Signed)
Pt unable to void. Was complaining of discomfort. Bladder scan showed 300 cc urine. I&O cath performed. 200 cc urine obtained. Pt is experiencing less nausea. Heart rate had decreased to 95. Oxygen sats at 95%. He remains congested and is not able to expectorate any mucous.

## 2017-08-24 NOTE — Evaluation (Signed)
Occupational Therapy Evaluation Patient Details Name: Joshua Fletcher MRN: [id number] DOB: July 07, 1936 Today's Date: 08/24/2017    History of Present Illness 81 y.o.malewith multiple medical problems detailed below including diabetes, coronary artery disease and hypertension who presented to the emergency department with a painful right hip after a fall. Underwent IM nail placement on 08/23/17 and is TDWB.   Clinical Impression   PTA, pt was living with his wife and performing with ADLs. Pt currently requiring Mod A for grooming at EOB with supported sitting, Max A for LB ADLs, and Mod A +2 for sit<>stand. Pt would benefit from further acute OT to facilitate safe dc. Recommend dc to SNF for further OT to increase safety and independence with ADLs and functional mobility.     Follow Up Recommendations  SNF;Supervision/Assistance - 24 hour    Equipment Recommendations  Other (comment) (Defer to next venue)    Recommendations for Other Services PT consult     Precautions / Restrictions Precautions Precautions: Fall Restrictions Weight Bearing Restrictions: Yes RLE Weight Bearing: Touchdown weight bearing      Mobility Bed Mobility Overal bed mobility: Needs Assistance Bed Mobility: Supine to Sit;Sit to Supine     Supine to sit: +2 for physical assistance;Mod assist Sit to supine: Mod assist   General bed mobility comments: +rail, verbal cues for sequencing, increased time and effort  Transfers Overall transfer level: Needs assistance Equipment used: Rolling walker (2 wheeled) Transfers: Sit to/from Stand Sit to Stand: +2 physical assistance;Mod assist         General transfer comment: Pt unable to attain full upright stance. Flexion noted at trunk, hip and knees. Family reports pt ambulated in a flexed position PTA. Pt only able to stand ~ 30 seconds. Pt able to maintain TDWB in static standing with RW.    Balance Overall balance assessment: Needs  assistance Sitting-balance support: Single extremity supported;Feet supported Sitting balance-Leahy Scale: Poor     Standing balance support: Bilateral upper extremity supported;During functional activity Standing balance-Leahy Scale: Zero Standing balance comment: heavy reliance on RW and therapist assist                           ADL either performed or assessed with clinical judgement   ADL Overall ADL's : Needs assistance/impaired Eating/Feeding: Set up;Sitting Eating/Feeding Details (indicate cue type and reason): supported sitting Grooming: Oral care;Moderate assistance;Sitting Grooming Details (indicate cue type and reason): Pt able to perform oral care with one hand supporting on bed. Requiring Mod A to maintain sitting at EOB while performing bilateral coorination tasks. Upper Body Bathing: Moderate assistance;Sitting   Lower Body Bathing: Maximal assistance;+2 for physical assistance;Sit to/from stand   Upper Body Dressing : Moderate assistance;Sitting   Lower Body Dressing: Maximal assistance;Sit to/from stand               Functional mobility during ADLs: Moderate assistance;Rolling walker General ADL Comments: Pt demonstrating decreased fucntional performance. Pt requiring Mod A for UB ADLs and Max A +2 for LB ADLs. Pt able to adhere to WB status during sit<>Stand     Vision         Perception     Praxis      Pertinent Vitals/Pain Pain Assessment: Faces Faces Pain Scale: Hurts even more Pain Location: RLE with mobility Pain Descriptors / Indicators: Grimacing;Moaning;Operative site guarding;Sore Pain Intervention(s): Monitored during session;Limited activity within patient's tolerance;Repositioned     Hand Dominance Right   Extremity/Trunk Assessment  Upper Extremity Assessment Upper Extremity Assessment: Generalized weakness   Lower Extremity Assessment Lower Extremity Assessment: Defer to PT evaluation RLE: Unable to fully assess due  to pain   Cervical / Trunk Assessment Cervical / Trunk Assessment: Kyphotic   Communication Communication Communication: No difficulties   Cognition Arousal/Alertness: Lethargic;Suspect due to medications Behavior During Therapy: [facility] for tasks assessed/performed Overall Cognitive Status: Within Functional Limits for tasks assessed                                     General Comments  Wife and daughter present during session    Exercises     Shoulder Instructions      Home Living Family/patient expects to be discharged to:: Skilled nursing facility Living Arrangements: Spouse/significant other Available Help at Discharge: Family;Available 24 hours/day Type of Home: House       Home Layout: One level     Bathroom Shower/Tub: Occupational psychologist: Handicapped height     Home Equipment: Environmental consultant - 2 wheels;Cane - single point;Shower seat;Grab bars - tub/shower;Bedside commode          Prior Functioning/Environment Level of Independence: Independent  Gait / Transfers Assistance Needed: Shuffles when he walks. No use of DME ADL's / Homemaking Assistance Needed: Wife performing IADLs. Pt able to perform dressing, bathing, and making simple meals (pbj sandwhich and chocolate milk)   Comments: ~4 fall this year        OT Problem List: Decreased strength;Decreased range of motion;Decreased activity tolerance;Impaired balance (sitting and/or standing);Decreased safety awareness;Decreased knowledge of use of DME or AE;Decreased knowledge of precautions;Pain      OT Treatment/Interventions: Self-care/ADL training;Therapeutic exercise;Energy conservation;DME and/or AE instruction;Therapeutic activities;Patient/family education    OT Goals(Current goals can be found in the care plan section) Acute Rehab OT Goals Patient Stated Goal: not stated OT Goal Formulation: With patient Time For Goal Achievement: 09/07/17 Potential to Achieve Goals:  Good ADL Goals Pt Will Perform Grooming: with supervision;with set-up;sitting Pt Will Perform Upper Body Dressing: with set-up;with supervision;sitting Pt Will Perform Lower Body Dressing: with min assist;sit to/from stand;with adaptive equipment Pt Will Transfer to Toilet: with min assist;bedside commode;ambulating Pt Will Perform Toileting - Clothing Manipulation and hygiene: with min assist;sit to/from stand  OT Frequency: Min 2X/week   Barriers to D/C:            Co-evaluation PT/OT/SLP Co-Evaluation/Treatment: Yes Reason for Co-Treatment: For patient/therapist safety;To address functional/ADL transfers PT goals addressed during session: Mobility/safety with mobility;Balance OT goals addressed during session: ADL's and self-care      AM-PAC PT "6 Clicks" Daily Activity     Outcome Measure Help from another person eating meals?: A Little Help from another person taking care of personal grooming?: A Little Help from another person toileting, which includes using toliet, bedpan, or urinal?: A Lot Help from another person bathing (including washing, rinsing, drying)?: A Lot Help from another person to put on and taking off regular upper body clothing?: A Little Help from another person to put on and taking off regular lower body clothing?: A Lot 6 Click Score: 15   End of Session Equipment Utilized During Treatment: Gait belt;Rolling walker Nurse Communication: Mobility status  Activity Tolerance: Patient limited by fatigue;Patient limited by pain Patient left: in bed;with call bell/phone within reach;with bed alarm set;with family/visitor present  OT Visit Diagnosis: Unsteadiness on feet (R26.81);Other abnormalities of gait and mobility (R26.89);Muscle  weakness (generalized) (M62.81);Pain Pain - Right/Left: Right Pain - part of body: Leg                Time: 7615-1834 OT Time Calculation (min): 29 min Charges:  OT General Charges $OT Visit: 1 Visit OT Evaluation $OT  Eval Moderate Complexity: 1 Mod G-Codes:     [place] MSOT, OTR/L Acute Rehab Pager: [phone] Office: [place] 08/24/2017, 1:14 PM

## 2017-08-24 NOTE — Progress Notes (Signed)
Pt continues to c/o nausea despite receiving zofran. BP 89/45. Pulse 115. Sat 92% on 3L [place]. Pt diaphoretic. Is alert and oriented. Having pain, but with low bp, can only give him tylenol. hospitalist notified of elevated cbg and low bp.

## 2017-08-24 NOTE — Progress Notes (Signed)
PROGRESS NOTE    Joshua Fletcher  OAC:[id number] DOB: July 20, 1936 DOA: 08/22/2017 PCP: Sinda Du, MD   Brief Narrative: 81 year old male active smoker, COPD not on oxygen, diabetes, coronary artery disease, hypertension presented after a fall sustaining right hip fracture. Patient was transferred from [facility] per consultants evaluation and surgical intervention.  Assessment & Plan:  #  Fall sustaining comminuted proximal right femoral fracture: -s/p intramedullary nail by Ortho on 9/29.  -Up with therapy and possibly discharge to skilled nursing facility. PT/OT  evaluation. -DVT prophylaxis as per orthopedics.  #COPD and active smoker: Chest x-ray consistent with COPD and interstitial lung disease. Continue bronchodilators.  -Currently on 3 L of oxygen, ? Hypoxia.  try to wean down oxygen. Continue bronchodilators. Incentive spirometer.  #Type 2 diabetes: Continue sliding scale. Monitor blood sugar level  #Essential hypertension: Patient was hypotensive last night. Discontinue lisinopril. Currently on only metoprolol. Blood pressure is improving. Continue to monitor.  # Acute kidney injury likely hemodynamically mediated in the setting of hypotension: Serum creatinine level elevated to 2.1 today. Patient required intermittent catheterization once. Continue bladder scan. Check ultrasound kidneys. UA unremarkable. Avoid nephrotoxins. Discontinue lisinopril. Monitor blood pressure. -gentle IV hydration.  # Possible acute blood loss anemia: Drop in hemoglobin today likely due to blood loss during surgery. Monitor CBC.  #Mass of the oropharynx: Anesthesia evaluation before surgery. Recommended to follow-up with ENT outpatient. Patient denied any throat pain or problems swallowing.  #Coronary artery disease: Stable.  #Hyponatremia: Monitor BMP.   #Goals of care discussion: I have discussed goals of care with the patient and his wife at bedside. Patient was alert awake and oriented. He wants  to be resuscitated and remains full code. Wife at beside.   DVT prophylaxis: Heparin subcutaneous, per Ortho Code Status: Full code Family Communication: Discussed with the patient's wife at bedside Disposition Plan: Currently admitted    Consultants:   Orthopedics  Procedures: None Antimicrobials: None  Subjective: Patient was seen and examined at bedside. Denied headache, dizziness, nausea vomiting chest pressure shortness of breath.  Objective: Vitals:   08/24/17 0347 08/24/17 0500 08/24/17 0523 08/24/17 1119  BP: (!) 106/56   140/67  Pulse: (!) 106 95 97 89  Resp: 18 18  18   Temp:      TempSrc:      SpO2: 96% 95% 95% 93%  Weight:      Height:        Intake/Output Summary (Last 24 hours) at 08/24/17 1449 Last data filed at 08/24/17 0500  Gross per 24 hour  Intake          1324.17 ml  Output              250 ml  Net          1074.17 ml   Filed Weights   08/22/17 0353  Weight: 83 kg (183 lb)    Examination:  General exam: Elderly male lying on bed comfortable  Respiratory system: Clear bilaterally, respiratory effort normal. No wheezing or crackle Cardiovascular system: Regular rate rhythm, S1 is normal. No pedal edema Gastrointestinal system: Abdomen soft, nontender, nondistended. Bowel sound positive. Central nervous system: Alert awake and oriented. No focal neurological deficit. Skin: No rashes, lesions or ulcers Psychiatry: Judgement and insight appear normal. Mood & affect appropriate    Data Reviewed: I have personally reviewed following labs and imaging studies  CBC:  Recent Labs Lab 08/22/17 0459 08/22/17 1220 08/23/17 0553 08/24/17 0518  WBC 11.0* 9.8 11.8* 16.2*  NEUTROABS 8.2*  --   --   --   HGB 13.7 13.7 12.0* 8.8*  HCT 41.0 40.9 35.6* 26.3*  MCV 92.6 90.3 89.9 91.0  PLT 146* 146* 164 170   Basic Metabolic Panel:  Recent Labs Lab 08/22/17 0459 08/22/17 1220 08/23/17 0553 08/24/17 0518  NA 132*  --  130* 130*  K 4.0  --   4.4 4.0  CL 94*  --  96* 98*  CO2 29  --  26 19*  GLUCOSE 196*  --  190* 282*  BUN 18  --  11 36*  CREATININE 0.88 0.98 0.72 2.18*  CALCIUM 8.7*  --  8.6* 8.2*   GFR: Estimated Creatinine Clearance: 30.7 mL/min (A) (by C-G formula based on SCr of 2.18 mg/dL (H)). Liver Function Tests:  Recent Labs Lab 08/23/17 0553  AST 20  ALT 13*  ALKPHOS 67  BILITOT 0.9  PROT 6.0*  ALBUMIN 3.2*   No results for input(s): LIPASE, AMYLASE in the last 168 hours. No results for input(s): AMMONIA in the last 168 hours. Coagulation Profile: No results for input(s): INR, PROTIME in the last 168 hours. Cardiac Enzymes: No results for input(s): CKTOTAL, CKMB, CKMBINDEX, TROPONINI in the last 168 hours. BNP (last 3 results) No results for input(s): PROBNP in the last 8760 hours. HbA1C: No results for input(s): HGBA1C in the last 72 hours. CBG:  Recent Labs Lab 08/23/17 2038 08/23/17 2242 08/24/17 0012 08/24/17 0557 08/24/17 1211  GLUCAP 256* 306* 313* 267* 184*   Lipid Profile: No results for input(s): CHOL, HDL, LDLCALC, TRIG, CHOLHDL, LDLDIRECT in the last 72 hours. Thyroid Function Tests: No results for input(s): TSH, T4TOTAL, FREET4, T3FREE, THYROIDAB in the last 72 hours. Anemia Panel: No results for input(s): VITAMINB12, FOLATE, FERRITIN, TIBC, IRON, RETICCTPCT in the last 72 hours. Sepsis Labs: No results for input(s): PROCALCITON, LATICACIDVEN in the last 168 hours.  Recent Results (from the past 240 hour(s))  Surgical pcr screen     Status: None   Collection Time: 08/22/17  9:19 PM  Result Value Ref Range Status   MRSA, PCR NEGATIVE NEGATIVE Final   Staphylococcus aureus NEGATIVE NEGATIVE Final    Comment: (NOTE) The Xpert SA Assay (FDA approved for NASAL specimens in patients 38 years of age and older), is one component of a comprehensive surveillance program. It is not intended to diagnose infection nor to guide or monitor treatment.          Radiology  Studies: Dg C-arm 1-60 Min  Result Date: 08/23/2017 CLINICAL DATA:  Status post ORIF of proximal femur fracture. EXAM: DG C-ARM 61-120 MIN; RIGHT FEMUR 2 VIEWS COMPARISON:  Earlier today FINDINGS: The patient has undergone open reduction and internal fixation of the proximal right femur fracture. An intramedullary rod and hip screw has been placed. The hardware components and fracture fragments appear to be in near anatomic alignment. IMPRESSION: 1. Status post ORIF of proximal right femur fracture. Electronically Signed   By: Kerby Moors M.D.   On: 08/23/2017 16:16   Dg Femur, Min 2 Views Right  Result Date: 08/23/2017 CLINICAL DATA:  Status post ORIF of proximal femur fracture. EXAM: DG C-ARM 61-120 MIN; RIGHT FEMUR 2 VIEWS COMPARISON:  Earlier today FINDINGS: The patient has undergone open reduction and internal fixation of the proximal right femur fracture. An intramedullary rod and hip screw has been placed. The hardware components and fracture fragments appear to be in near anatomic alignment. IMPRESSION: 1. Status post ORIF of proximal right  femur fracture. Electronically Signed   By: Kerby Moors M.D.   On: 08/23/2017 16:16   Dg Femur Port, Min 2 Views Right  Result Date: 08/23/2017 CLINICAL DATA:  Right IM nail EXAM: RIGHT FEMUR PORTABLE 2 VIEW COMPARISON:  08/23/2017 FINDINGS: Intramedullary rod and hip screw reduces the obliquely oriented fracture involving the proximal diaphysis of the right femur. The hardware components and fracture fragments are in anatomic alignment. IMPRESSION: 1. Status post ORIF of right femur fracture. Electronically Signed   By: Kerby Moors M.D.   On: 08/23/2017 19:17        Scheduled Meds: . gabapentin  300 mg Oral QHS  . heparin  5,000 Units Subcutaneous Q8H  . insulin aspart  0-9 Units Subcutaneous TID WC  . metoprolol tartrate  100 mg Oral BID  . nicotine  14 mg Transdermal Daily  . simvastatin  40 mg Oral Daily  . tamsulosin  0.4 mg Oral QPC  supper   Continuous Infusions: . lactated ringers 10 mL/hr at 08/23/17 1141  . methocarbamol (ROBAXIN)  IV       LOS: 2 days    Edia Pursifull Tanna Furry, MD [facility] Pager [phone]  If 7PM-7AM, please contact night-coverage [email or web address] Password [email or web address] 08/24/2017, 2:49 PM

## 2017-08-25 ENCOUNTER — Inpatient Hospital Stay (HOSPITAL_COMMUNITY): Payer: Medicare HMO

## 2017-08-25 DIAGNOSIS — D62 Acute posthemorrhagic anemia: Secondary | ICD-10-CM

## 2017-08-25 LAB — GLUCOSE, CAPILLARY
GLUCOSE-CAPILLARY: 175 mg/dL — AB (ref 65–99)
Glucose-Capillary: 229 mg/dL — ABNORMAL HIGH (ref 65–99)
Glucose-Capillary: 199 mg/dL — ABNORMAL HIGH (ref 65–99)
Glucose-Capillary: 196 mg/dL — ABNORMAL HIGH (ref 65–99)

## 2017-08-25 LAB — BASIC METABOLIC PANEL
Anion gap: 7 (ref 5–15)
BUN: 65 mg/dL — AB (ref 6–20)
CALCIUM: 8.3 mg/dL — AB (ref 8.9–10.3)
CHLORIDE: 101 mmol/L (ref 101–111)
CO2: 24 mmol/L (ref 22–32)
CREATININE: 1.92 mg/dL — AB (ref 0.61–1.24)
GFR calc Af Amer: 37 mL/min — ABNORMAL LOW (ref >60)
GFR calc non Af Amer: 32 mL/min — ABNORMAL LOW (ref >60)
GLUCOSE: 213 mg/dL — AB (ref 65–99)
Potassium: 3.6 mmol/L (ref 3.5–5.1)
Sodium: 132 mmol/L — ABNORMAL LOW (ref 135–145)

## 2017-08-25 LAB — CBC
HEMATOCRIT: 21.3 % — AB (ref 39.0–52.0)
HEMOGLOBIN: 7.4 g/dL — AB (ref 13.0–17.0)
MCH: 30.7 pg (ref 26.0–34.0)
MCHC: 34.7 g/dL (ref 30.0–36.0)
MCV: 88.4 fL (ref 78.0–100.0)
Platelets: 139 10*3/uL — ABNORMAL LOW (ref 150–400)
RBC: 2.41 MIL/uL — ABNORMAL LOW (ref 4.22–5.81)
RDW: 14.3 % (ref 11.5–15.5)
WBC: 9.8 10*3/uL (ref 4.0–10.5)

## 2017-08-25 LAB — PREPARE RBC (CROSSMATCH)

## 2017-08-25 LAB — ABO/RH: ABO/RH(D): O POS

## 2017-08-25 MED ORDER — SODIUM CHLORIDE 0.9 % IV SOLN
Freq: Once | INTRAVENOUS | Status: AC
  Administered 2017-08-25: 10:00:00 via INTRAVENOUS

## 2017-08-25 MED ORDER — GABAPENTIN 300 MG PO CAPS: 300.0000 mg | ORAL_CAPSULE | Freq: Every day | ORAL | Status: DC

## 2017-08-25 NOTE — Clinical Social Work Note (Signed)
Clinical Social Work Assessment  Patient Details  Name: Joshua Fletcher MRN: [id number] Date of Birth: 02/03/1936  Date of referral:  08/25/17               Reason for consult:  Facility Placement                Permission sought to share information with:  Case Manager Permission granted to share information::  Yes, Verbal Permission Granted  Name::     Joshua Fletcher & daughter  Agency::  [facility]  Relationship::     Contact Information:     Housing/Transportation Living arrangements for the past 2 months:  Single Family Home Source of Information:  Adult Children Patient Interpreter Needed:  None Criminal Activity/Legal Involvement Pertinent to Current Situation/Hospitalization:  No - Comment as needed Significant Relationships:  Spouse, Adult Children Lives with:  Spouse Do you feel safe going back to the place where you live?  No Need for family participation in patient care:  Yes (Comment)  Care giving concerns:  Pt from home with spouse. Pt unable to return home at this time given new impairment and will need skilled nursing.  Social Worker assessment / plan:  CSW met with daughter at bedside to discuss clinical team's recommendation for SNF.  Family amenable to SNF in the [place] area. CSW explained her role in the SNF process. CSW discussed SNF options and insurance auth process as well. Family in agreement and understands. CSW obtained permission to send out to [facility].  CSW will send out to local area and follow up with transition to SNF at appropriate time.  Employment status:  Retired Nurse, adult PT Recommendations:  [place] / Referral to community resources:  [place]  Patient/Family's Response to care:  Family appreciative of [place] meeting to discuss disposition. No issues or concerns identified at this time.  Patient/Family's Understanding of and Emotional Response to Diagnosis, Current  Treatment, and Prognosis:  Family has good understanding of diagnosis, current treatment and prognosis. Family hopeful that we can get bed offers for SNF in . Family hopeful that patient will improve with short term rehab.  No other issues or concerns identified at this time.  Emotional Assessment Appearance:  Appears stated age Attitude/Demeanor/Rapport:  Lethargic, Sedated Affect (typically observed):  Unable to Assess Orientation:  Oriented to Self, Oriented to Situation, Oriented to Place, Oriented to  Time Alcohol / Substance use:  Not Applicable Psych involvement (Current and /or in the community):  No (Comment)  Discharge Needs  Concerns to be addressed:  Care Coordination Readmission within the last 30 days:  No Current discharge risk:  Physical Impairment, Dependent with Mobility Barriers to Discharge:  No Barriers Identified   Joshua Baxter, LCSW 08/25/2017, 12:18 PM

## 2017-08-25 NOTE — Progress Notes (Signed)
Inpatient Diabetes Program Recommendations  AACE/ADA: New Consensus Statement on Inpatient Glycemic Control (2015)  Target Ranges:  Prepandial:   less than 140 mg/dL      Peak postprandial:   less than 180 mg/dL (1-2 hours)      Critically ill patients:  140 - 180 mg/dL  Results for Joshua Fletcher, Joshua Fletcher (MRN [id number]) as of 08/25/2017 10:58  Ref. Range 08/24/2017 05:57 08/24/2017 12:11 08/24/2017 17:30 08/24/2017 21:28 08/25/2017 06:48  Glucose-Capillary Latest Ref Range: 65 - 99 mg/dL 267 (H) 184 (H) 277 (H) 218 (H) 229 (H)   Results for Joshua Fletcher, Joshua Fletcher (MRN [id number]) as of 08/25/2017 10:58  Ref. Range 08/12/2017 09:23  Hemoglobin A1C Latest Ref Range: <5.7 % of total Hgb 6.0 (H)   Review of Glycemic Control  Diabetes history: DM2 Outpatient Diabetes medications: Metformin 1000 mg BID Current orders for Inpatient glycemic control: Novolog 0-9 units TID with meals  Inpatient Diabetes Program Recommendations:  Correction (SSI): Please consider increasing Novolog to Moderate scale (0-15 units) TID and ordering Novolog 0-5 units QHS for bedtime correction scale. HgbA1C: A1C 6.0% on 08/12/17.   NOTE: In reviewing chart, noted patient received Decadron 5 mg x 1 on 08/23/17 which is contributing to hyperglycemia.  Thanks, Barnie Alderman, RN, MSN, CDE Diabetes Coordinator Inpatient Diabetes Program [phone] (Team Pager from 8am to 5pm)

## 2017-08-25 NOTE — Progress Notes (Signed)
PROGRESS NOTE    Joshua Fletcher  HYI:[id number] DOB: 05-03-1936 DOA: 08/22/2017 PCP: Sinda Du, MD   Brief Narrative: 81 year old male active smoker, COPD not on oxygen, diabetes, coronary artery disease, hypertension presented after a fall sustaining right hip fracture. Patient was transferred from [facility] per consultants evaluation and surgical intervention.  Assessment & Plan:  #  Fall sustaining comminuted proximal right femoral fracture: -s/p intramedullary nail by Ortho on 9/29.  -Up with therapy and possibly discharge to skilled nursing facility. PT/OT  evaluation. -DVT prophylaxis as per orthopedics.  #COPD and active smoker: Chest x-ray consistent with COPD and interstitial lung disease. Continue bronchodilators.  -Currently on 3 L of oxygen, ? Hypoxia.  try to wean down oxygen. Continue bronchodilators. Incentive spirometer.  #Type 2 diabetes: Continue sliding scale. Monitor blood sugar level  #Essential hypertension: Patient was hypotensive last night. Discontinue lisinopril. Currently on only metoprolol. Blood pressure is improving. Continue to monitor.  # Acute kidney injury likely hemodynamically mediated in the setting of hypotension: Serum creatinine level trending down to 1.9 today. Peak creatinine of 2.1.  -Off lisinopril. Ultrasound of kidneys unremarkable. A UA unremarkable. Continue to monitor BMP. Avoid nephrotoxins. Discussed with the patient's daughter at bedside. Maintain blood pressure acceptable range.  # Possible acute blood loss anemia: Drop in hemoglobin today, hemoglobin of 7.4. Patient feels weak and fatigue likely has symptomatic anemia. Plan to transfuse unit of blood today. Discussed with the patient and his daughter at bedside and agreed with the plan.  #Mass of the oropharynx: Anesthesia evaluation before surgery. Recommended to follow-up with ENT outpatient. Patient denied any throat pain or problems swallowing.  #Coronary artery disease:  Stable.  #Hyponatremia: Monitor BMP.   #Goals of care discussion: I have discussed goals of care with the patient and his wife at bedside on 9/30. Patient was alert awake and oriented. He wants to be resuscitated and remained full code.    DVT prophylaxis: Heparin subcutaneous, per Ortho Code Status: Full code Family Communication: Discussed with the patient's husband at bedside Disposition Plan: Currently admitted    Consultants:   Orthopedics  Procedures: None Antimicrobials: None  Subjective: Patient was seen and examined at bedside. Denied headache, dizziness, nausea or vomiting. Has weakness. Denies urinary symptoms. Patient's daughter at bedside.  Objective: Vitals:   08/24/17 2125 08/25/17 0600 08/25/17 1400 08/25/17 1445  BP: (!) 126/54 (!) 122/57 (!) 90/47 103/61  Pulse: 84 86 70 75  Resp: 16 16 14 16   Temp: 98.2 F (36.8 C) 98.4 F (36.9 C) 97.7 F (36.5 C) 97.8 F (36.6 C)  TempSrc: Oral Oral Axillary Axillary  SpO2: 91% 96% (!) 89% 96%  Weight:      Height:        Intake/Output Summary (Last 24 hours) at 08/25/17 1520 Last data filed at 08/25/17 1400  Gross per 24 hour  Intake              585 ml  Output              300 ml  Net              285 ml   Filed Weights   08/22/17 0353  Weight: 83 kg (183 lb)    Examination:  General exam: Elderly male lying on bed, pale  Respiratory system: Clear bilaterally, respiratory effort normal Cardiovascular system: Regular rate rhythm, S1-S2 normal Gastrointestinal system: Abdomen soft, nontender, nondistended. Bowel sound positive Central nervous system: Alert awake and following commands. Skin:  No rashes, lesions or ulcers   Data Reviewed: I have personally reviewed following labs and imaging studies  CBC:  Recent Labs Lab 08/22/17 0459 08/22/17 1220 08/23/17 0553 08/24/17 0518 08/25/17 0726  WBC 11.0* 9.8 11.8* 16.2* 9.8  NEUTROABS 8.2*  --   --   --   --   HGB 13.7 13.7 12.0* 8.8* 7.4*   HCT 41.0 40.9 35.6* 26.3* 21.3*  MCV 92.6 90.3 89.9 91.0 88.4  PLT 146* 146* 164 171 096*   Basic Metabolic Panel:  Recent Labs Lab 08/22/17 0459 08/22/17 1220 08/23/17 0553 08/24/17 0518 08/25/17 0726  NA 132*  --  130* 130* 132*  K 4.0  --  4.4 4.0 3.6  CL 94*  --  96* 98* 101  CO2 29  --  26 19* 24  GLUCOSE 196*  --  190* 282* 213*  BUN 18  --  11 36* 65*  CREATININE 0.88 0.98 0.72 2.18* 1.92*  CALCIUM 8.7*  --  8.6* 8.2* 8.3*   GFR: Estimated Creatinine Clearance: 34.8 mL/min (A) (by C-G formula based on SCr of 1.92 mg/dL (H)). Liver Function Tests:  Recent Labs Lab 08/23/17 0553  AST 20  ALT 13*  ALKPHOS 67  BILITOT 0.9  PROT 6.0*  ALBUMIN 3.2*   No results for input(s): LIPASE, AMYLASE in the last 168 hours. No results for input(s): AMMONIA in the last 168 hours. Coagulation Profile: No results for input(s): INR, PROTIME in the last 168 hours. Cardiac Enzymes: No results for input(s): CKTOTAL, CKMB, CKMBINDEX, TROPONINI in the last 168 hours. BNP (last 3 results) No results for input(s): PROBNP in the last 8760 hours. HbA1C: No results for input(s): HGBA1C in the last 72 hours. CBG:  Recent Labs Lab 08/24/17 1211 08/24/17 1730 08/24/17 2128 08/25/17 0648 08/25/17 1158  GLUCAP 184* 277* 218* 229* 199*   Lipid Profile: No results for input(s): CHOL, HDL, LDLCALC, TRIG, CHOLHDL, LDLDIRECT in the last 72 hours. Thyroid Function Tests: No results for input(s): TSH, T4TOTAL, FREET4, T3FREE, THYROIDAB in the last 72 hours. Anemia Panel: No results for input(s): VITAMINB12, FOLATE, FERRITIN, TIBC, IRON, RETICCTPCT in the last 72 hours. Sepsis Labs: No results for input(s): PROCALCITON, LATICACIDVEN in the last 168 hours.  Recent Results (from the past 240 hour(s))  Surgical pcr screen     Status: None   Collection Time: 08/22/17  9:19 PM  Result Value Ref Range Status   MRSA, PCR NEGATIVE NEGATIVE Final   Staphylococcus aureus NEGATIVE NEGATIVE  Final    Comment: (NOTE) The Xpert SA Assay (FDA approved for NASAL specimens in patients 29 years of age and older), is one component of a comprehensive surveillance program. It is not intended to diagnose infection nor to guide or monitor treatment.          Radiology Studies: US Renal  Result Date: 08/25/2017 CLINICAL DATA:  81 year old hypertensive diabetic male with acute kidney insufficiency. Initial encounter. EXAM: RENAL / URINARY TRACT ULTRASOUND COMPLETE COMPARISON:  08/11/2012 CT. FINDINGS: Right Kidney: Length: 12 cm. Renal parenchymal thinning. No hydronephrosis or mass. Left Kidney: Length: 11.6 cm. Renal parenchymal thinning. No hydronephrosis or mass. Bladder: Appears normal for degree of bladder distention. IMPRESSION: Bilateral renal parenchymal thinning. No hydronephrosis. Electronically Signed   By: Genia Del M.D.   On: 08/25/2017 07:01   Dg Femur [place], [place] 2 Views Right  Result Date: 08/23/2017 CLINICAL DATA:  Right IM nail EXAM: RIGHT FEMUR PORTABLE 2 VIEW COMPARISON:  08/23/2017 FINDINGS: Intramedullary rod  and hip screw reduces the obliquely oriented fracture involving the proximal diaphysis of the right femur. The hardware components and fracture fragments are in anatomic alignment. IMPRESSION: 1. Status post ORIF of right femur fracture. Electronically Signed   By: Kerby Moors M.D.   On: 08/23/2017 19:17        Scheduled Meds: . [START ON 08/26/2017] gabapentin  300 mg Oral QHS  . heparin  5,000 Units Subcutaneous Q8H  . insulin aspart  0-9 Units Subcutaneous TID WC  . metoprolol tartrate  100 mg Oral BID  . nicotine  14 mg Transdermal Daily  . simvastatin  40 mg Oral Daily  . tamsulosin  0.4 mg Oral QPC supper   Continuous Infusions: . lactated ringers 10 mL/hr at 08/23/17 1141  . methocarbamol (ROBAXIN)  IV       LOS: 3 days    Madyx Delfin Tanna Furry, MD [facility] Pager [phone]  If 7PM-7AM, please contact  night-coverage [email or web address] Password [email or web address] 08/25/2017, 3:20 PM

## 2017-08-25 NOTE — NC FL2 (Signed)
[place] MEDICAID FL2 LEVEL OF CARE SCREENING TOOL     IDENTIFICATION  Patient Name: Joshua Fletcher Birthdate: 1936/05/28 Sex: male Admission Date (Current Location): 08/22/2017  [facility] and [place] Number:  Herbalist and Address:  [facility] [place], [place] [address] [place], [place] [address]      Provider Number: [id number]  Attending Physician Name and Address:  Rosita Fire, MD  Relative Name and Phone Number:  Robi Dewolfe, [phone]    Current Level of Care: Hospital Recommended Level of Care: [place] Prior Approval Number:    Date Approved/Denied: 08/25/17 PASRR Number: [id number]  Discharge Plan: SNF    Current Diagnoses: Patient Active Problem List   Diagnosis Date Noted  . Acute kidney injury ([place])   . Displaced subtrochanteric fracture of right femur, initial encounter for closed fracture ([place]) 08/22/2017  . Hip fracture, right ([place]) 08/22/2017  . COPD (chronic obstructive pulmonary disease) ([place]) 08/22/2017  . Smoker 08/22/2017  . Type 2 diabetes mellitus ([place]) 08/22/2017  . Coronary artery disease 08/22/2017  . Thrombocytopenia ([place]) 08/22/2017  . Hyponatremia 08/22/2017  . Leukocytosis 08/22/2017  . Mass of oropharynx 08/22/2017  . Cervical spondylosis 08/22/2017  . Melanoma of skin ([place]) 02/13/2017  . Mixed hyperlipidemia 10/24/2015  . Essential hypertension, benign 10/24/2015    Orientation RESPIRATION BLADDER Height & Weight     Self, Time, Situation, Place  O2 (Nasal Cannula 3L) External catheter, Continent Weight: 183 lb (83 kg) Height:  6' (182.9 cm)  BEHAVIORAL SYMPTOMS/MOOD NEUROLOGICAL BOWEL NUTRITION STATUS      Continent Diet (See DC Summary)  AMBULATORY STATUS COMMUNICATION OF NEEDS Skin   Extensive Assist Verbally                         Personal Care Assistance Level of Assistance  Dressing, Bathing, Feeding Bathing Assistance: Limited assistance Feeding  assistance: Limited assistance Dressing Assistance: Maximum assistance     Functional Limitations Info             SPECIAL CARE FACTORS FREQUENCY  PT (By licensed PT), OT (By licensed OT)     PT Frequency: 3x week OT Frequency: 2x week            Contractures      Additional Factors Info  Code Status, Allergies, Insulin Sliding Scale Code Status Info: Full Code Allergies Info: No Known Allergies   Insulin Sliding Scale Info: Insulin Daily       Current Medications (08/25/2017):  This is the current hospital active medication list Current Facility-Administered Medications  Medication Dose Route Frequency Provider Last Rate Last Dose  . 0.9 %  sodium chloride infusion   Intravenous Continuous Rosita Fire, MD 75 mL/hr at 08/25/17 0200    . acetaminophen (TYLENOL) tablet 650 mg  650 mg Oral Q6H PRN Wynetta Emery, Clanford L, MD   650 mg at 08/25/17 0758   Or  . acetaminophen (TYLENOL) suppository 650 mg  650 mg Rectal Q6H PRN Johnson, Clanford L, MD      . albuterol (PROVENTIL) (2.5 MG/3ML) 0.083% nebulizer solution 2.5 mg  2.5 mg Nebulization Q4H PRN Johnson, Clanford L, MD      . gabapentin (NEURONTIN) capsule 300 mg  300 mg Oral QHS Johnson, Clanford L, MD   300 mg at 08/24/17 2353  . heparin injection 5,000 Units  5,000 Units Subcutaneous Q8H Johnson, Clanford L, MD   5,000 Units at 08/25/17 0618  .  hydrALAZINE (APRESOLINE) injection 10 mg  10 mg Intravenous Q4H PRN Johnson, Clanford L, MD   10 mg at 08/23/17 0725  . HYDROmorphone (DILAUDID) injection 0.5-1 mg  0.5-1 mg Intravenous Q2H PRN Wynetta Emery, Clanford L, MD   1 mg at 08/23/17 1802  . insulin aspart (novoLOG) injection 0-9 Units  0-9 Units Subcutaneous TID WC Wynetta Emery, Clanford L, MD   3 Units at 08/25/17 0756  . ipratropium (ATROVENT) nebulizer solution 0.5 mg  0.5 mg Nebulization Q4H PRN Wynetta Emery, Clanford L, MD      . lactated ringers infusion   Intravenous Continuous Catalina Gravel, MD 10 mL/hr at  08/23/17 1141    . methocarbamol (ROBAXIN) tablet 500 mg  500 mg Oral Q6H PRN Nicholes Stairs, MD   500 mg at 08/24/17 1411   Or  . methocarbamol (ROBAXIN) 500 mg in dextrose 5 % 50 mL IVPB  500 mg Intravenous Q6H PRN Nicholes Stairs, MD      . metoCLOPramide [facility]) tablet 5-10 mg  5-10 mg Oral Q8H PRN Nicholes Stairs, MD       Or  . metoCLOPramide [facility]) injection 5-10 mg  5-10 mg Intravenous Q8H PRN Nicholes Stairs, MD   10 mg at 08/23/17 2209  . metoprolol tartrate (LOPRESSOR) tablet 100 mg  100 mg Oral BID Rosita Fire, MD   100 mg at 08/24/17 2354  . nicotine (NICODERM CQ - dosed in mg/24 hours) patch 14 mg  14 mg Transdermal Daily Rosita Fire, MD   14 mg at 08/24/17 1122  . ondansetron (ZOFRAN) tablet 4 mg  4 mg Oral Q6H PRN Johnson, Clanford L, MD   4 mg at 08/24/17 1709   Or  . ondansetron (ZOFRAN) injection 4 mg  4 mg Intravenous Q6H PRN Wynetta Emery, Clanford L, MD   4 mg at 08/23/17 2316  . simvastatin (ZOCOR) tablet 40 mg  40 mg Oral Daily Johnson, Clanford L, MD   40 mg at 08/24/17 1122  . tamsulosin (FLOMAX) capsule 0.4 mg  0.4 mg Oral QPC supper Wynetta Emery, Clanford L, MD   0.4 mg at 08/24/17 1709     Discharge Medications: Please see discharge summary for a list of discharge medications.  Relevant Imaging Results:  Relevant Lab Results:   Additional Information SS#:[id number] [place], LCSW

## 2017-08-25 NOTE — Progress Notes (Signed)
   Subjective:  Patient reports pain as moderate.  Feels better up in the chair.  Denies SOB/CP/DIzziness, having some nausea, no vomiting.  Objective:   VITALS:   Vitals:   08/24/17 2125 08/25/17 0600 08/25/17 1400 08/25/17 1445  BP: (!) 126/54 (!) 122/57 (!) 90/47 103/61  Pulse: 84 86 70 75  Resp: 16 16 14 16   Temp: 98.2 F (36.8 C) 98.4 F (36.9 C) 97.7 F (36.5 C) 97.8 F (36.6 C)  TempSrc: Oral Oral Axillary Axillary  SpO2: 91% 96% (!) 89% 96%  Weight:      Height:        Neurologically intact Incision: dressing C/D/I Distally SILT dp/sp/sur/saph/TN, decreased in TN 2/2 DM neuropathy Motor intact distally with TA/GSC/FHL/EHL Foot wwp, cap refill < 2s  Lab Results  Component Value Date   WBC 9.8 08/25/2017   HGB 7.4 (L) 08/25/2017   HCT 21.3 (L) 08/25/2017   MCV 88.4 08/25/2017   PLT 139 (L) 08/25/2017   BMET    Component Value Date/Time   NA 132 (L) 08/25/2017 0726   K 3.6 08/25/2017 0726   CL 101 08/25/2017 0726   CO2 24 08/25/2017 0726   GLUCOSE 213 (H) 08/25/2017 0726   BUN 65 (H) 08/25/2017 0726   CREATININE 1.92 (H) 08/25/2017 0726   CREATININE 0.97 08/12/2017 0923   CALCIUM 8.3 (L) 08/25/2017 0726   GFRNONAA 32 (L) 08/25/2017 0726   GFRNONAA 74 08/12/2017 0923   GFRAA 37 (L) 08/25/2017 0726   GFRAA 86 08/12/2017 0923     Assessment/Plan: 2 Days Post-Op   Principal Problem:   Displaced subtrochanteric fracture of right femur, initial encounter for closed fracture (HCC) Active Problems:   Mixed hyperlipidemia   Essential hypertension, benign   Hip fracture, right (HCC)   COPD (chronic obstructive pulmonary disease) (HCC)   Smoker   Type 2 diabetes mellitus (HCC)   Coronary artery disease   Thrombocytopenia (HCC)   Hyponatremia   Leukocytosis   Mass of oropharynx   Cervical spondylosis   Acute kidney injury ([place])   Acute blood loss anemia   Up with therapy -FTWB to RLE -maintain dressings Until follow-up, unless they become  saturated and then they may be changed to a daily dry dressing. - Aspirin twice a day and SCDs for DVT prophylaxis. - We will likely need discharge to [facility] 08/25/2017, 3:37 PM   Geralynn Rile, MD [phone]

## 2017-08-25 NOTE — Social Work (Signed)
CSW met with daughter at bedside and discussed bed offers and they have accepted bed offer from [facility].   CSW called SNF and spoke with Tammy in admission and they will initiate insurance auth for SNF placement. CSW will continue to follow Joshua Fletcher can take up to 48 hours to obtain.  [name], LCSW Clinical Social Worker [phone]

## 2017-08-25 NOTE — Progress Notes (Signed)
Physical Therapy Treatment Patient Details Name: Joshua Fletcher MRN: [id number] DOB: [birth year] Today's Date: 08/25/2017    History of Present Illness 81 y.o.malewith multiple medical problems detailed below including diabetes, coronary artery disease and hypertension who presented to the emergency department with a painful right hip after a fall. Underwent IM nail placement on 08/23/17 and is TDWB.    PT Comments    Pt performed functional mobility followed by limited therapeutic exercises in supine position.  PTA issued HEP to family for continued carryover with strengthening exercises.  Pt lethargic and required AAROM to PROM.  Pt remains to require +2 max.  Plan for WC mobility next session to encourage OOB activity tolerance.    Follow Up Recommendations  SNF     Equipment Recommendations  Other (comment) (defer to next venue.  )    Recommendations for Other Services       Precautions / Restrictions Precautions Precautions: Fall Restrictions Weight Bearing Restrictions: Yes RLE Weight Bearing: Touchdown weight bearing    Mobility  Bed Mobility Overal bed mobility: Needs Assistance Bed Mobility: Supine to Sit;Sit to Supine Rolling: Max assist   Supine to sit: Max assist;+2 for physical assistance Sit to supine: Max assist;+2 for physical assistance   General bed mobility comments: Pt required assistance to elevate trunk into sitting and advance B LEs to edge of bed.  Pt required cues for hand placement.  Pt sat edge of bed extended period of time and required assist to right LOB and cues and facilitation to shift weight to correct posture and balance.  Balance remains poor.    Transfers Overall transfer level: Needs assistance Equipment used:  (slide board.  ) Transfers: Lateral/Scoot Transfers Sit to Stand: +2 physical assistance;+2 safety/equipment;Max assist Stand pivot transfers: Total assist;+2 physical assistance;+2 safety/equipment      Lateral/Scoot  Transfers: +2 physical assistance;Total assist;With slide board General transfer comment: Pt having increasing difficult to stand and maintain weight bearing this am with OT.  PTA opted for lateral scoot and patient able to follow commands for hand placement but required +2 total assist to scoot chux pad across slide board from elevated surface.  Pt with poor trunk control and required increased effort to correct posture and scoot back into recliner.  Pt very fatigued post session.    Ambulation/Gait Ambulation/Gait assistance:  (Pt is not safe to ambulate at this time due to strength deficits and lethargic presentation. )               Stairs            Wheelchair Mobility    Modified [place] (Stroke Patients Only)       Balance Overall balance assessment: Needs assistance   Sitting balance-Leahy Scale: Poor Sitting balance - Comments: Poor to Zero required max VCs for upright posture and to correct.  Right response is delayed.      Standing balance comment: did not assess                            Cognition Arousal/Alertness: Lethargic;Suspect due to medications Behavior During Therapy: [facility] for tasks assessed/performed Overall Cognitive Status: Difficult to assess                                 General Comments: Pt with increased lethargy after transfer back to supine in bed; Pt's daughter reporting Pt appearing more  confused this AM than usual, to be further assessed       Exercises Total Joint Exercises Ankle Circles/Pumps: AROM;Both;10 reps;Supine;AAROM (AAROM on RLE) Quad Sets: AROM;Right (x2 reps with trace muscle contraction. ) Short Arc Quad: AAROM;Right;10 reps;Supine Heel Slides: PROM;Right;10 reps;Supine Hip ABduction/ADduction: PROM;Right;10 reps;Supine    General Comments General comments (skin integrity, edema, etc.): daughter present during session       Pertinent Vitals/Pain Pain Assessment: Faces Faces Pain Scale:  Hurts little more Pain Location: RLE with mobility Pain Descriptors / Indicators: Grimacing;Moaning;Operative site guarding;Sore Pain Intervention(s): Monitored during session;Repositioned;Ice applied    Home Living                      Prior Function            PT Goals (current goals can now be found in the care plan section) Acute Rehab PT Goals Patient Stated Goal: not stated Potential to Achieve Goals: Good Progress towards PT goals: Progressing toward goals    Frequency    Min 3X/week      PT Plan Current plan remains appropriate    Co-evaluation              AM-PAC PT "6 Clicks" Daily Activity  Outcome Measure  Difficulty turning over in bed (including adjusting bedclothes, sheets and blankets)?: Unable Difficulty moving from lying on back to sitting on the side of the bed? : Unable Difficulty sitting down on and standing up from a chair with arms (e.g., wheelchair, bedside commode, etc,.)?: Unable Help needed moving to and from a bed to chair (including a wheelchair)?: A Lot Help needed walking in hospital room?: Total Help needed climbing 3-5 steps with a railing? : Total 6 Click Score: 7    End of Session Equipment Utilized During Treatment: Gait belt Activity Tolerance: Patient tolerated treatment well Patient left: in bed;with bed alarm set;with call bell/phone within reach;with family/visitor present Nurse Communication: Mobility status;Need for lift equipment PT Visit Diagnosis: Other abnormalities of gait and mobility (R26.89);Muscle weakness (generalized) (M62.81);Pain Pain - Right/Left: Right Pain - part of body: Hip     Time: 0347-4259 PT Time Calculation (min) (ACUTE ONLY): 53 min  Charges:  $Therapeutic Exercise: 8-22 mins $Therapeutic Activity: 38-52 mins                    G Codes:       Governor Rooks, PTA pager [phone]    Cristela Blue 08/25/2017, 1:54 PM

## 2017-08-25 NOTE — Progress Notes (Signed)
Patient ID: Joshua Fletcher, male   DOB: Apr 02, 1936, 81 y.o.   MRN: [id number] Subjective: 2 Days Post-Op Procedure(s) (LRB): INTRAMEDULLARY (IM) NAIL FEMORAL (Right)    Patient reports pain as moderate to severe.  Having increased pain while in bed.  Daughter wants dc to SNF near their home. Objective:   VITALS:  VITALS:       Vitals:   08/24/17 0500 08/24/17 0523  BP:    Pulse: 95 97  Resp: 18   Temp:    SpO2: 95% 95%     Neurovascular intact Incision: dressing C/D/I, right thigh incisions Distally SILT dp/sp/sur/saph/TN, decreased in TN 2/2 neuropathy  Motor weak distal but intact with TA/GSC/FHL/EHL Cap refill < 2 s  LABS    Recent Labs  08/23/17 0553 08/24/17 0518   NA 130* 130*   K 4.4 4.0   BUN 11 36*   CREATININE 0.72 2.18*   GLUCOSE 190* 282*     No results for input(s): LABPT, INR in the last 72 hours.   Assessment/Plan: 2 Days Post-Op Procedure(s) (LRB): INTRAMEDULLARY (IM) NAIL FEMORAL (Right)   Advance diet Up with therapy Discharge to SNF when medical stable Medical team to review DNR levels and status with family Noted by his daughter to smoke and fairly deconditioned pre-operatively Asa and SCD for DVT ppx

## 2017-08-25 NOTE — Progress Notes (Addendum)
Occupational Therapy Treatment Patient Details Name: Joshua Fletcher MRN: [id number] DOB: 02/04/1936 Today's Date: 08/25/2017    History of present illness 81 y.o.malewith multiple medical problems detailed below including diabetes, coronary artery disease and hypertension who presented to the emergency department with a painful right hip after a fall. Underwent IM nail placement on 08/23/17 and is TDWB.   OT comments  Assisted NT with transferring Pt back to bed from recliner. Pt requires Max-total assist +2 for stand pivot transfer using RW (+3 for safety/to maintain WB status). Pt presents with increased fatigue/lethargy this session, was unable to remain awake/alert to complete further ADLs after return to supine this session. Feel SNF remains appropriate recommendation to progress Pt's safety and independence with ADLs and functional mobility after discharge. Will continue to follow acutely.    Follow Up Recommendations  SNF;Supervision/Assistance - 24 hour    Equipment Recommendations  Other (comment) (defer to next venue )          Precautions / Restrictions Precautions Precautions: Fall Restrictions Weight Bearing Restrictions: Yes RLE Weight Bearing: Touchdown weight bearing       Mobility Bed Mobility Overal bed mobility: Needs Assistance Bed Mobility: Sit to Supine;Rolling Rolling: Max assist     Sit to supine: Max assist;+2 for physical assistance   General bed mobility comments: assist for trunk and LE management to return to supine; +2 to scoot to [facility]  Transfers Overall transfer level: Needs assistance Equipment used: Rolling walker (2 wheeled) Transfers: Sit to/from [facility] Sit to Stand: +2 physical assistance;+2 safety/equipment;Max assist Stand pivot transfers: Total assist;+2 physical assistance;+2 safety/equipment       General transfer comment: Pt required MaxA +2 for sit<>stand at RW, Max-total assist +2 to pivot to transfer to  EOB due to Pt with increased fatigue, 3rd person present and assisting for safety as Pt with increased lethary/increased difficulty maintaining WB status in RLE during transfer     Balance           Standing balance support: Bilateral upper extremity supported Standing balance-Leahy Scale: Zero Standing balance comment: heavy reliance on RW and therapist assist                           ADL either performed or assessed with clinical judgement   ADL Overall ADL's : Needs assistance/impaired       Grooming Details (indicate cue type and reason): attempted simple grooming ADLs however Pt with siginificant increased fatigue after transfer back to bed; Pt's daughter reporting Pt currently needing mod-MaxA for task completion                              Functional mobility during ADLs: Total assistance;+2 for physical assistance;+2 for safety/equipment General ADL Comments: Assisted NT with transfer back to bed with Pt requiring total assist +2 (+3 for safety) due to Pt fatigue                       Cognition Arousal/Alertness: Lethargic;Suspect due to medications Behavior During Therapy: [facility] for tasks assessed/performed Overall Cognitive Status: Difficult to assess                                 General Comments: Pt with increased lethargy after transfer back to supine in bed; Pt's daughter reporting Pt appearing  more confused this AM than usual, to be further assessed                     General Comments daughter present during session     Pertinent Vitals/ Pain       Pain Assessment: Faces Faces Pain Scale: Hurts whole lot Pain Location: RLE with mobility Pain Descriptors / Indicators: Grimacing;Moaning;Operative site guarding;Sore Pain Intervention(s): Limited activity within patient's tolerance;Monitored during session;Repositioned                                                           Frequency  Min 2X/week        Progress Toward Goals  OT Goals(current goals can now be found in the care plan section)  Progress towards OT goals: OT to reassess next treatment  Acute Rehab OT Goals Patient Stated Goal: not stated OT Goal Formulation: With patient Time For Goal Achievement: 09/07/17 Potential to Achieve Goals: Good  Plan Discharge plan remains appropriate                     AM-PAC PT "6 Clicks" Daily Activity     Outcome Measure   Help from another person eating meals?: A Little Help from another person taking care of personal grooming?: A Little Help from another person toileting, which includes using toliet, bedpan, or urinal?: A Lot Help from another person bathing (including washing, rinsing, drying)?: A Lot Help from another person to put on and taking off regular upper body clothing?: A Little Help from another person to put on and taking off regular lower body clothing?: A Lot 6 Click Score: 15    End of Session Equipment Utilized During Treatment: Gait belt;Rolling walker  OT Visit Diagnosis: Unsteadiness on feet (R26.81);Other abnormalities of gait and mobility (R26.89);Muscle weakness (generalized) (M62.81);Pain Pain - Right/Left: Right Pain - part of body: Leg   Activity Tolerance Patient limited by fatigue;Patient limited by pain   Patient Left in bed;with call bell/phone within reach;with family/visitor present   Nurse Communication Mobility status        Time: 3329-5188 OT Time Calculation (min): 19 min  Charges: OT General Charges $OT Visit: 1 Visit OT Treatments $Self Care/Home Management : 8-22 mins  Lou Cal, OT Pager [phone] 08/25/2017    Raymondo Band 08/25/2017, 11:34 AM

## 2017-08-26 ENCOUNTER — Inpatient Hospital Stay
Admission: RE | Admit: 2017-08-26 | Discharge: 2017-10-17 | Disposition: A | Discharge status: Nursing Home (Includes ICF) | Payer: Medicare HMO | Source: Ambulatory Visit | Attending: Internal Medicine | Admitting: Internal Medicine

## 2017-08-26 DIAGNOSIS — J440 Chronic obstructive pulmonary disease with acute lower respiratory infection: Secondary | ICD-10-CM | POA: Diagnosis not present

## 2017-08-26 DIAGNOSIS — I2510 Atherosclerotic heart disease of native coronary artery without angina pectoris: Secondary | ICD-10-CM | POA: Diagnosis not present

## 2017-08-26 DIAGNOSIS — Z955 Presence of coronary angioplasty implant and graft: Secondary | ICD-10-CM | POA: Diagnosis not present

## 2017-08-26 DIAGNOSIS — Z8781 Personal history of (healed) traumatic fracture: Secondary | ICD-10-CM | POA: Diagnosis not present

## 2017-08-26 DIAGNOSIS — Z85828 Personal history of other malignant neoplasm of skin: Secondary | ICD-10-CM | POA: Diagnosis not present

## 2017-08-26 DIAGNOSIS — R17 Unspecified jaundice: Secondary | ICD-10-CM | POA: Diagnosis not present

## 2017-08-26 DIAGNOSIS — Z967 Presence of other bone and tendon implants: Secondary | ICD-10-CM | POA: Diagnosis not present

## 2017-08-26 DIAGNOSIS — M6281 Muscle weakness (generalized): Secondary | ICD-10-CM | POA: Diagnosis not present

## 2017-08-26 DIAGNOSIS — R69 Illness, unspecified: Secondary | ICD-10-CM | POA: Diagnosis not present

## 2017-08-26 DIAGNOSIS — R278 Other lack of coordination: Secondary | ICD-10-CM | POA: Diagnosis not present

## 2017-08-26 DIAGNOSIS — I509 Heart failure, unspecified: Secondary | ICD-10-CM | POA: Diagnosis not present

## 2017-08-26 DIAGNOSIS — J9601 Acute respiratory failure with hypoxia: Secondary | ICD-10-CM | POA: Diagnosis not present

## 2017-08-26 DIAGNOSIS — E871 Hypo-osmolality and hyponatremia: Secondary | ICD-10-CM | POA: Diagnosis not present

## 2017-08-26 DIAGNOSIS — R05 Cough: Secondary | ICD-10-CM | POA: Diagnosis not present

## 2017-08-26 DIAGNOSIS — S728X9A Other fracture of unspecified femur, initial encounter for closed fracture: Secondary | ICD-10-CM | POA: Diagnosis not present

## 2017-08-26 DIAGNOSIS — S72141A Displaced intertrochanteric fracture of right femur, initial encounter for closed fracture: Secondary | ICD-10-CM | POA: Diagnosis not present

## 2017-08-26 DIAGNOSIS — E119 Type 2 diabetes mellitus without complications: Secondary | ICD-10-CM | POA: Diagnosis not present

## 2017-08-26 DIAGNOSIS — S72141D Displaced intertrochanteric fracture of right femur, subsequent encounter for closed fracture with routine healing: Secondary | ICD-10-CM | POA: Diagnosis not present

## 2017-08-26 DIAGNOSIS — Z7982 Long term (current) use of aspirin: Secondary | ICD-10-CM | POA: Diagnosis not present

## 2017-08-26 DIAGNOSIS — I10 Essential (primary) hypertension: Secondary | ICD-10-CM | POA: Diagnosis not present

## 2017-08-26 DIAGNOSIS — Z9181 History of falling: Secondary | ICD-10-CM | POA: Diagnosis not present

## 2017-08-26 DIAGNOSIS — Z79899 Other long term (current) drug therapy: Secondary | ICD-10-CM | POA: Diagnosis not present

## 2017-08-26 DIAGNOSIS — G8911 Acute pain due to trauma: Secondary | ICD-10-CM | POA: Diagnosis not present

## 2017-08-26 DIAGNOSIS — S7221XD Displaced subtrochanteric fracture of right femur, subsequent encounter for closed fracture with routine healing: Secondary | ICD-10-CM | POA: Diagnosis not present

## 2017-08-26 DIAGNOSIS — J449 Chronic obstructive pulmonary disease, unspecified: Secondary | ICD-10-CM | POA: Diagnosis not present

## 2017-08-26 DIAGNOSIS — R000 Tachycardia, unspecified: Secondary | ICD-10-CM | POA: Diagnosis not present

## 2017-08-26 DIAGNOSIS — M47892 Other spondylosis, cervical region: Secondary | ICD-10-CM | POA: Diagnosis not present

## 2017-08-26 DIAGNOSIS — S8991XA Unspecified injury of right lower leg, initial encounter: Secondary | ICD-10-CM | POA: Diagnosis not present

## 2017-08-26 DIAGNOSIS — D696 Thrombocytopenia, unspecified: Secondary | ICD-10-CM | POA: Diagnosis not present

## 2017-08-26 DIAGNOSIS — F17210 Nicotine dependence, cigarettes, uncomplicated: Secondary | ICD-10-CM | POA: Diagnosis present

## 2017-08-26 DIAGNOSIS — I9589 Other hypotension: Secondary | ICD-10-CM | POA: Diagnosis not present

## 2017-08-26 DIAGNOSIS — R21 Rash and other nonspecific skin eruption: Secondary | ICD-10-CM | POA: Diagnosis not present

## 2017-08-26 DIAGNOSIS — E782 Mixed hyperlipidemia: Secondary | ICD-10-CM | POA: Diagnosis not present

## 2017-08-26 DIAGNOSIS — R262 Difficulty in walking, not elsewhere classified: Secondary | ICD-10-CM | POA: Diagnosis not present

## 2017-08-26 DIAGNOSIS — D62 Acute posthemorrhagic anemia: Secondary | ICD-10-CM | POA: Diagnosis not present

## 2017-08-26 DIAGNOSIS — J189 Pneumonia, unspecified organism: Secondary | ICD-10-CM | POA: Diagnosis not present

## 2017-08-26 DIAGNOSIS — S7221XA Displaced subtrochanteric fracture of right femur, initial encounter for closed fracture: Secondary | ICD-10-CM | POA: Diagnosis not present

## 2017-08-26 DIAGNOSIS — Z7951 Long term (current) use of inhaled steroids: Secondary | ICD-10-CM | POA: Diagnosis not present

## 2017-08-26 DIAGNOSIS — Z794 Long term (current) use of insulin: Secondary | ICD-10-CM | POA: Diagnosis not present

## 2017-08-26 DIAGNOSIS — G4700 Insomnia, unspecified: Secondary | ICD-10-CM | POA: Diagnosis not present

## 2017-08-26 DIAGNOSIS — D72829 Elevated white blood cell count, unspecified: Secondary | ICD-10-CM | POA: Diagnosis not present

## 2017-08-26 DIAGNOSIS — J181 Lobar pneumonia, unspecified organism: Secondary | ICD-10-CM | POA: Diagnosis not present

## 2017-08-26 DIAGNOSIS — Z833 Family history of diabetes mellitus: Secondary | ICD-10-CM | POA: Diagnosis not present

## 2017-08-26 DIAGNOSIS — F17200 Nicotine dependence, unspecified, uncomplicated: Secondary | ICD-10-CM | POA: Diagnosis not present

## 2017-08-26 DIAGNOSIS — R0989 Other specified symptoms and signs involving the circulatory and respiratory systems: Secondary | ICD-10-CM | POA: Diagnosis not present

## 2017-08-26 DIAGNOSIS — N179 Acute kidney failure, unspecified: Secondary | ICD-10-CM | POA: Diagnosis not present

## 2017-08-26 DIAGNOSIS — R221 Localized swelling, mass and lump, neck: Secondary | ICD-10-CM | POA: Diagnosis not present

## 2017-08-26 DIAGNOSIS — E118 Type 2 diabetes mellitus with unspecified complications: Secondary | ICD-10-CM | POA: Diagnosis not present

## 2017-08-26 LAB — BASIC METABOLIC PANEL
ANION GAP: 7 (ref 5–15)
BUN: 62 mg/dL — AB (ref 6–20)
CHLORIDE: 103 mmol/L (ref 101–111)
CO2: 23 mmol/L (ref 22–32)
Calcium: 8.2 mg/dL — ABNORMAL LOW (ref 8.9–10.3)
Creatinine, Ser: 1.42 mg/dL — ABNORMAL HIGH (ref 0.61–1.24)
GFR calc Af Amer: 53 mL/min — ABNORMAL LOW (ref >60)
GFR calc non Af Amer: 46 mL/min — ABNORMAL LOW (ref >60)
GLUCOSE: 195 mg/dL — AB (ref 65–99)
POTASSIUM: 3.3 mmol/L — AB (ref 3.5–5.1)
Sodium: 133 mmol/L — ABNORMAL LOW (ref 135–145)

## 2017-08-26 LAB — GLUCOSE, CAPILLARY
GLUCOSE-CAPILLARY: 218 mg/dL — AB (ref 65–99)
GLUCOSE-CAPILLARY: 173 mg/dL — AB (ref 65–99)
Glucose-Capillary: 159 mg/dL — ABNORMAL HIGH (ref 65–99)

## 2017-08-26 LAB — TYPE AND SCREEN
ABO/RH(D): O POS
Antibody Screen: NEGATIVE
UNIT DIVISION: 0

## 2017-08-26 LAB — CBC
HEMATOCRIT: 22.7 % — AB (ref 39.0–52.0)
HEMOGLOBIN: 7.8 g/dL — AB (ref 13.0–17.0)
MCH: 29.9 pg (ref 26.0–34.0)
MCHC: 34.4 g/dL (ref 30.0–36.0)
MCV: 87 fL (ref 78.0–100.0)
Platelets: 126 10*3/uL — ABNORMAL LOW (ref 150–400)
RBC: 2.61 MIL/uL — ABNORMAL LOW (ref 4.22–5.81)
RDW: 15.4 % (ref 11.5–15.5)
WBC: 9.5 10*3/uL (ref 4.0–10.5)

## 2017-08-26 LAB — BPAM RBC
BLOOD PRODUCT EXPIRATION DATE: 201810182359
ISSUE DATE / TIME: 201810011350
UNIT TYPE AND RH: 5100

## 2017-08-26 MED ORDER — POTASSIUM CHLORIDE CRYS ER 20 MEQ PO TBCR
40.0000 meq | EXTENDED_RELEASE_TABLET | Freq: Every day | ORAL | Status: DC
  Administered 2017-08-26: 40 meq via ORAL
  Filled 2017-08-26: qty 2

## 2017-08-26 MED ORDER — POTASSIUM CHLORIDE CRYS ER 20 MEQ PO TBCR: 20.0000 meq | EXTENDED_RELEASE_TABLET | Freq: Every day | ORAL | Status: DC

## 2017-08-26 MED ORDER — POLYETHYLENE GLYCOL 3350 17 G PO PACK: 17.0000 g | PACK | Freq: Every day | ORAL | 0 refills | Status: DC

## 2017-08-26 MED ORDER — GABAPENTIN 300 MG PO CAPS
300.0000 mg | ORAL_CAPSULE | Freq: Every day | ORAL | Status: AC
Start: 2017-08-26 — End: ?

## 2017-08-26 MED ORDER — POLYETHYLENE GLYCOL 3350 17 G PO PACK
17.0000 g | PACK | Freq: Every day | ORAL | Status: DC
  Administered 2017-08-26: 17 g via ORAL
  Filled 2017-08-26: qty 1

## 2017-08-26 MED ORDER — METHOCARBAMOL 500 MG PO TABS: 500.0000 mg | ORAL_TABLET | Freq: Four times a day (QID) | ORAL | Status: DC | PRN

## 2017-08-26 NOTE — Clinical Social Work Placement (Signed)
   CLINICAL SOCIAL WORK PLACEMENT  NOTE  Date:  08/26/2017  Patient Details  Name: Joshua Fletcher MRN: [id number] Date of Birth: [birth year]  Clinical Social Work is seeking post-discharge placement for this patient at the [place] level of care (*CSW will initial, date and re-position this form in  chart as items are completed):  Yes   Patient/family provided with [place] Work Department's list of facilities offering this level of care within the geographic area requested by the patient (or if unable, by the patient's family).  Yes   Patient/family informed of their freedom to choose among providers that offer the needed level of care, that participate in Medicare, Medicaid or managed care program needed by the patient, have an available bed and are willing to accept the patient.  Yes   Patient/family informed of [place]'s ownership interest in [facility] and [facility], as well as of the fact that they are under no obligation to receive care at these facilities.  PASRR submitted to EDS on       PASRR number received on 08/25/17     Existing PASRR number confirmed on       FL2 transmitted to all facilities in geographic area requested by pt/family on 08/25/17     FL2 transmitted to all facilities within larger geographic area on       Patient informed that his/her managed care company has contracts with or will negotiate with certain facilities, including the following:        Yes   Patient/family informed of bed offers received.  Patient chooses bed at [facility]     Physician recommends and patient chooses bed at      Patient to be transferred to [facility] on 08/26/17.  Patient to be transferred to facility by [facility]     Patient family notified on 08/26/17 of transfer.  Name of family member notified:  family at bedside     PHYSICIAN Please prepare prescriptions     Additional Comment:     _______________________________________________ Normajean Baxter, LCSW 08/26/2017, 3:37 PM

## 2017-08-26 NOTE — Social Work (Addendum)
CSW f/u with SNF for insurance authorization for [facility]. SNF advised that they have not heard back from [place] and will call CSW once they receive.  CSW met with family and advised of same.  3:35pm CSW spoke with SNF and they have not received [place]. SNF will take 5 day LOG.   [name], LCSW Clinical Social Worker [phone]

## 2017-08-26 NOTE — Progress Notes (Signed)
Inpatient Diabetes Program Recommendations  AACE/ADA: New Consensus Statement on Inpatient Glycemic Control (2015)  Target Ranges:  Prepandial:   less than 140 mg/dL      Peak postprandial:   less than 180 mg/dL (1-2 hours)      Critically ill patients:  140 - 180 mg/dL   Lab Results  Component Value Date   GLUCAP 159 (H) 08/26/2017   HGBA1C 6.0 (H) 08/12/2017    Review of Glycemic Control  Results for Joshua Fletcher, Joshua Fletcher (MRN [id number]) as of 08/26/2017 09:28  Ref. Range 08/25/2017 06:48 08/25/2017 11:58 08/25/2017 16:56 08/25/2017 21:51 08/26/2017 06:25  Glucose-Capillary Latest Ref Range: 65 - 99 mg/dL 229 (H) 199 (H) 196 (H) 175 (H) 159 (H)    Diabetes history: Type 2 Outpatient Diabetes medications: Metformin 1000mg  bid  Current orders for Inpatient glycemic control: Metformin 1000mg  bid, Novolog 0-9 units tid  Inpatient Diabetes Program Recommendations: Consider adding Novolog 0-5 units qhs  Gentry Fitz, RN, [place], [place], CDE Diabetes Coordinator Inpatient Diabetes Program  [phone] (Team Pager) [phone] ([place]) 08/26/2017 9:39 AM

## 2017-08-26 NOTE — Discharge Summary (Addendum)
Physician Discharge Summary  Joshua Fletcher:[id number] DOB: 1936/09/11 DOA: 08/22/2017  PCP: Sinda Du, MD  Admit date: 08/22/2017 Discharge date: 08/26/2017  Admitted From:Home Disposition:SNF  Recommendations for Outpatient Follow-up:  1. Follow up with PCP in 1-2 weeks 2. Please obtain BMP/CBC in 2-3 days  Home Health:SNF Equipment/Devices:none Discharge Condition:stable CODE STATUS:full code Diet recommendation:heart healthy/carb modified  Brief/Interim Summary: 81 year old male active smoker, COPD not on oxygen, diabetes, coronary artery disease, hypertension presented after a fall sustaining right hip fracture. Patient was transferred from [facility] for consultants evaluation and surgical intervention.  #  Fall sustaining comminuted proximal right femoral fracture: -s/p intramedullary nail by Ortho on 9/29.  -Up with therapy and ASA for DVT ppx as per Orthopedics.  -Outpatient follow-up with orthopedics. PT OT evaluated the patient is going to nursing facility to continue his care.  #COPD and active smoker: Chest x-ray consistent with COPD and interstitial lung disease. Continue bronchodilators.  -No hypoxia. Recommended to follow-up with pulmonologist.  #Type 2 diabetes: Continue home medication. Monitor blood sugar level.  #Essential hypertension: Lisinopril on hold because of renal failure. Metoprolol. Blood pressure acceptable.  # Acute kidney injury likely hemodynamically mediated in the setting of hypotension: Serum creatinine level trending down to 1.4 today. Peak creatinine of 2.1.  -Off lisinopril. Ultrasound of kidneys unremarkable.  UA unremarkable.  -Recommended to repeat BMP in 2-3 days. Avoid nephrotoxins.  #Acute blood loss anemia during surgery:  received blood cell transfusion. Hemoglobin 7.8 today. No sign of active bleed. Recommended to monitor CBC outpatient.  #Mass of the oropharynx: Recommended to follow-up with ENT outpatient. Patient denied  any throat pain or problems swallowing. It was discussed with the patient, his wife and daughter during this hospitalization.  #Coronary artery disease: Stable.  #Hyponatremia: Monitor BMP.   #Goals of care discussion: I have discussed goals of care with the patient and his wife at bedside on 9/30. Patient was alert awake and oriented. He wants to be resuscitated and remained full code.     Discharge Diagnoses:  Principal Problem:   Displaced subtrochanteric fracture of right femur, initial encounter for closed fracture [name]) Active Problems:   Mixed hyperlipidemia   Essential hypertension, benign   Hip fracture, right (HCC)   COPD (chronic obstructive pulmonary disease) (HCC)   Smoker   Type 2 diabetes mellitus (HCC)   Coronary artery disease   Thrombocytopenia (HCC)   Hyponatremia   Leukocytosis   Mass of oropharynx   Cervical spondylosis   Acute kidney injury ([name])   Acute blood loss anemia    Discharge Instructions  Discharge Instructions    Call MD for:  difficulty breathing, headache or visual disturbances    Complete by:  As directed    Call MD for:  extreme fatigue    Complete by:  As directed    Call MD for:  hives    Complete by:  As directed    Call MD for:  persistant dizziness or light-headedness    Complete by:  As directed    Call MD for:  persistant nausea and vomiting    Complete by:  As directed    Call MD for:  redness, tenderness, or signs of infection (pain, swelling, redness, odor or green/yellow discharge around incision site)    Complete by:  As directed    Call MD for:  severe uncontrolled pain    Complete by:  As directed    Call MD for:  temperature >100.4    Complete by:  As directed    Diet - low sodium heart healthy    Complete by:  As directed    Diet Carb Modified    Complete by:  As directed    Increase activity slowly    Complete by:  As directed      Allergies as of 08/26/2017   No Known Allergies     Medication List     STOP taking these medications   lisinopril 10 MG tablet Commonly known as:  PRINIVIL,ZESTRIL   oxyCODONE-acetaminophen 10-325 MG tablet Commonly known as:  PERCOCET     TAKE these medications   ALKA-SELTZER PLUS COLD & COUGH PO Take 2 tablets by mouth daily as needed (nasal congestion).   aspirin EC 81 MG tablet Take 1 tablet (81 mg total) by mouth 2 (two) times daily.   cholecalciferol 1000 units tablet Commonly known as:  VITAMIN D Take 5,000 Units by mouth daily.   gabapentin 300 MG capsule Commonly known as:  NEURONTIN Take 1 capsule (300 mg total) by mouth at bedtime. What changed:  when to take this   HYDROcodone-acetaminophen 5-325 MG tablet Commonly known as:  NORCO Take 1-2 tablets by mouth every 6 (six) hours as needed for moderate pain.   magnesium gluconate 500 MG tablet Commonly known as:  MAGONATE Take 500 mg by mouth daily.   metFORMIN 1000 MG tablet Commonly known as:  GLUCOPHAGE Take 1 tablet (1,000 mg total) by mouth 2 (two) times daily with a meal.   methocarbamol 500 MG tablet Commonly known as:  ROBAXIN Take 1 tablet (500 mg total) by mouth every 6 (six) hours as needed for muscle spasms.   metoprolol tartrate 50 MG tablet Commonly known as:  LOPRESSOR Take 100 mg by mouth 2 (two) times daily.   multivitamin with minerals Tabs tablet Take 1 tablet by mouth daily.   OVER THE COUNTER MEDICATION Take 1 tablet by mouth daily. ostelo bi flex triple strength   polyethylene glycol packet Commonly known as:  MIRALAX / GLYCOLAX Take 17 g by mouth daily.   potassium chloride SA 20 MEQ tablet Commonly known as:  K-DUR,KLOR-CON Take 1 tablet (20 mEq total) by mouth daily.   simvastatin 40 MG tablet Commonly known as:  ZOCOR Take 40 mg by mouth daily.   tamsulosin 0.4 MG Caps capsule Commonly known as:  FLOMAX Take 0.4 mg by mouth.   trolamine salicylate 10 % cream Commonly known as:  ASPERCREME Apply 1 application topically as needed for  muscle pain.       Contact information for follow-up providers    Nicholes Stairs, MD. Schedule an appointment as soon as possible for a visit in 2 week(s).   Specialty:  Orthopedic Surgery Contact information: [address] [place] [address] [phone]        Sinda Du, MD. Schedule an appointment as soon as possible for a visit in 1 week(s).   Specialty:  Pulmonary Disease Contact information: [place] [address] [phone]            Contact information for after-discharge care    [facility] Follow up.   Specialty:  Skilled Nursing Facility Contact information: [address] [place] [phone]                 No Known Allergies  Consultations: Orthopedics  Procedures/Studies: Ortho surgery  Subjective: Seen and examined at bedside.  Denied headache, dizziness, nausea vomiting chest pain shortness of breath. Leg pain is minimal. Patient's daughter at bedside.  Discharge Exam: Vitals:   08/25/17 2100 08/26/17 0551  BP: (!) 130/48 (!) 137/49  Pulse: 90 71  Resp:    Temp: 98.4 F (36.9 C) 98.4 F (36.9 C)  SpO2: 91% 90%   Vitals:   08/25/17 1445 08/25/17 1815 08/25/17 2100 08/26/17 0551  BP: 103/61 (!) 123/53 (!) 130/48 (!) 137/49  Pulse: 75 82 90 71  Resp: 16 14    Temp: 97.8 F (36.6 C) 97.9 F (36.6 C) 98.4 F (36.9 C) 98.4 F (36.9 C)  TempSrc: Axillary Oral Oral Oral  SpO2: 96%  91% 90%  Weight:      Height:        General: Pt is alert, awake, not in acute distress Cardiovascular: RRR, S1/S2 +, no rubs, no gallops Respiratory: CTA bilaterally, no wheezing, no rhonchi Abdominal: Soft, NT, ND, bowel sounds + Extremities: no edema, no cyanosis    The results of significant diagnostics from this hospitalization (including imaging, microbiology, ancillary and laboratory) are listed below for reference.      Microbiology: Recent Results (from the past 240 hour(s))  Surgical pcr screen     Status: None   Collection Time: 08/22/17  9:19 PM  Result Value Ref Range Status   MRSA, PCR NEGATIVE NEGATIVE Final   Staphylococcus aureus NEGATIVE NEGATIVE Final    Comment: (NOTE) The Xpert SA Assay (FDA approved for NASAL specimens in patients 63 years of age and older), is one component of a comprehensive surveillance program. It is not intended to diagnose infection nor to guide or monitor treatment.      Labs: BNP (last 3 results) No results for input(s): BNP in the last 8760 hours. Basic Metabolic Panel:  Recent Labs Lab 08/22/17 0459 08/22/17 1220 08/23/17 0553 08/24/17 0518 08/25/17 0726 08/26/17 0423  NA 132*  --  130* 130* 132* 133*  K 4.0  --  4.4 4.0 3.6 3.3*  CL 94*  --  96* 98* 101 103  CO2 29  --  26 19* 24 23  GLUCOSE 196*  --  190* 282* 213* 195*  BUN 18  --  11 36* 65* 62*  CREATININE 0.88 0.98 0.72 2.18* 1.92* 1.42*  CALCIUM 8.7*  --  8.6* 8.2* 8.3* 8.2*   Liver Function Tests:  Recent Labs Lab 08/23/17 0553  AST 20  ALT 13*  ALKPHOS 67  BILITOT 0.9  PROT 6.0*  ALBUMIN 3.2*   No results for input(s): LIPASE, AMYLASE in the last 168 hours. No results for input(s): AMMONIA in the last 168 hours. CBC:  Recent Labs Lab 08/22/17 0459 08/22/17 1220 08/23/17 0553 08/24/17 0518 08/25/17 0726 08/26/17 0423  WBC 11.0* 9.8 11.8* 16.2* 9.8 9.5  NEUTROABS 8.2*  --   --   --   --   --   HGB 13.7 13.7 12.0* 8.8* 7.4* 7.8*  HCT 41.0 40.9 35.6* 26.3* 21.3* 22.7*  MCV 92.6 90.3 89.9 91.0 88.4 87.0  PLT 146* 146* 164 171 139* 126*   Cardiac Enzymes: No results for input(s): CKTOTAL, CKMB, CKMBINDEX, TROPONINI in the last 168 hours. BNP: Invalid input(s): POCBNP CBG:  Recent Labs Lab 08/25/17 0648 08/25/17 1158 08/25/17 1656 08/25/17 2151 08/26/17 0625  GLUCAP 229* 199* 196* 175* 159*   D-Dimer No results for input(s): DDIMER in the last 72  hours. Hgb A1c No results for input(s): HGBA1C in the last 72 hours.  Lipid Profile No results for input(s): CHOL, HDL, LDLCALC, TRIG, CHOLHDL, LDLDIRECT in the last 72 hours. Thyroid function studies No results for input(s): TSH, T4TOTAL, T3FREE, THYROIDAB in the last 72 hours.  Invalid input(s): FREET3 Anemia work up No results for input(s): VITAMINB12, FOLATE, FERRITIN, TIBC, IRON, RETICCTPCT in the last 72 hours. Urinalysis    Component Value Date/Time   COLORURINE YELLOW 08/24/2017 1252   APPEARANCEUR HAZY (A) 08/24/2017 1252   LABSPEC 1.026 08/24/2017 1252   PHURINE 5.0 08/24/2017 1252   GLUCOSEU NEGATIVE 08/24/2017 1252   HGBUR SMALL (A) 08/24/2017 1252   BILIRUBINUR NEGATIVE 08/24/2017 1252   KETONESUR 5 (A) 08/24/2017 1252   PROTEINUR 30 (A) 08/24/2017 1252   NITRITE NEGATIVE 08/24/2017 1252   LEUKOCYTESUR NEGATIVE 08/24/2017 1252   Sepsis Labs Invalid input(s): PROCALCITONIN,  WBC,  LACTICIDVEN Microbiology Recent Results (from the past 240 hour(s))  Surgical pcr screen     Status: None   Collection Time: 08/22/17  9:19 PM  Result Value Ref Range Status   MRSA, PCR NEGATIVE NEGATIVE Final   Staphylococcus aureus NEGATIVE NEGATIVE Final    Comment: (NOTE) The Xpert SA Assay (FDA approved for NASAL specimens in patients 62 years of age and older), is one component of a comprehensive surveillance program. It is not intended to diagnose infection nor to guide or monitor treatment.      Time coordinating discharge:  30 minutes  SIGNED:   Rosita Fire, MD  [facility] 08/26/2017, 11:45 AM  If 7PM-7AM, please contact night-coverage [email or web address] Password [email or web address]

## 2017-08-26 NOTE — Social Work (Signed)
Clinical Social Worker facilitated patient discharge including contacting patient family and facility to confirm patient discharge plans.  Clinical information faxed to facility and family agreeable with plan.    CSW arranged ambulance transport via [facility] to [facility].    RN to call [phone] to give report prior to discharge.  Clinical Social Worker will sign off for now as social work intervention is no longer needed. Please consult us again if new need arises.  Brielynn Sekula, LCSW Clinical Social Worker [phone]    

## 2017-08-26 NOTE — Progress Notes (Signed)
Attempted to call report to [facility] x2. Left callback number on second call. IV removed, and belongings gathered. [facility] to transport. Will continue to monitor

## 2017-08-27 ENCOUNTER — Encounter (HOSPITAL_COMMUNITY)
Admission: RE | Admit: 2017-08-27 | Discharge: 2017-08-27 | Disposition: A | Discharge status: Home / Self Care | Payer: Medicare HMO | Source: Skilled Nursing Facility | Attending: Internal Medicine | Admitting: Internal Medicine

## 2017-08-27 DIAGNOSIS — Z4789 Encounter for other orthopedic aftercare: Secondary | ICD-10-CM | POA: Insufficient documentation

## 2017-08-27 DIAGNOSIS — S72141D Displaced intertrochanteric fracture of right femur, subsequent encounter for closed fracture with routine healing: Secondary | ICD-10-CM | POA: Insufficient documentation

## 2017-08-27 DIAGNOSIS — Z720 Tobacco use: Secondary | ICD-10-CM | POA: Insufficient documentation

## 2017-08-27 LAB — COMPREHENSIVE METABOLIC PANEL
ALBUMIN: 2.9 g/dL — AB (ref 3.5–5.0)
ALT: 14 U/L — ABNORMAL LOW (ref 17–63)
ANION GAP: 6 (ref 5–15)
AST: 26 U/L (ref 15–41)
Alkaline Phosphatase: 61 U/L (ref 38–126)
BUN: 49 mg/dL — ABNORMAL HIGH (ref 6–20)
CALCIUM: 8.4 mg/dL — AB (ref 8.9–10.3)
CHLORIDE: 106 mmol/L (ref 101–111)
CO2: 25 mmol/L (ref 22–32)
CREATININE: 1.07 mg/dL (ref 0.61–1.24)
GFR calc Af Amer: >60 mL/min (ref >60)
GFR calc non Af Amer: >60 mL/min (ref >60)
Glucose, Bld: 202 mg/dL — ABNORMAL HIGH (ref 65–99)
POTASSIUM: 4.5 mmol/L (ref 3.5–5.1)
SODIUM: 137 mmol/L (ref 135–145)
Total Bilirubin: 1.2 mg/dL (ref 0.3–1.2)
Total Protein: 6 g/dL — ABNORMAL LOW (ref 6.5–8.1)

## 2017-08-27 LAB — CBC
HEMATOCRIT: 26.1 % — AB (ref 39.0–52.0)
Hemoglobin: 8.8 g/dL — ABNORMAL LOW (ref 13.0–17.0)
MCH: 30.2 pg (ref 26.0–34.0)
MCHC: 33.7 g/dL (ref 30.0–36.0)
MCV: 89.7 fL (ref 78.0–100.0)
Platelets: 126 10*3/uL — ABNORMAL LOW (ref 150–400)
RBC: 2.91 MIL/uL — ABNORMAL LOW (ref 4.22–5.81)
RDW: 15.2 % (ref 11.5–15.5)
WBC: 8 10*3/uL (ref 4.0–10.5)

## 2017-08-28 ENCOUNTER — Non-Acute Institutional Stay (SKILLED_NURSING_FACILITY): Payer: Medicare HMO | Admitting: Internal Medicine

## 2017-08-28 ENCOUNTER — Encounter (HOSPITAL_COMMUNITY)
Admission: RE | Admit: 2017-08-28 | Discharge: 2017-08-28 | Disposition: A | Discharge status: Home / Self Care | Payer: Medicare HMO | Source: Skilled Nursing Facility | Attending: Internal Medicine | Admitting: Internal Medicine

## 2017-08-28 ENCOUNTER — Encounter: Payer: Self-pay | Admitting: Internal Medicine

## 2017-08-28 DIAGNOSIS — D62 Acute posthemorrhagic anemia: Secondary | ICD-10-CM | POA: Diagnosis not present

## 2017-08-28 DIAGNOSIS — Z9889 Other specified postprocedural states: Secondary | ICD-10-CM

## 2017-08-28 DIAGNOSIS — Z4789 Encounter for other orthopedic aftercare: Secondary | ICD-10-CM | POA: Insufficient documentation

## 2017-08-28 DIAGNOSIS — Z8781 Personal history of (healed) traumatic fracture: Secondary | ICD-10-CM | POA: Diagnosis not present

## 2017-08-28 DIAGNOSIS — F17200 Nicotine dependence, unspecified, uncomplicated: Secondary | ICD-10-CM

## 2017-08-28 DIAGNOSIS — J449 Chronic obstructive pulmonary disease, unspecified: Secondary | ICD-10-CM | POA: Diagnosis not present

## 2017-08-28 DIAGNOSIS — N179 Acute kidney failure, unspecified: Secondary | ICD-10-CM | POA: Diagnosis not present

## 2017-08-28 DIAGNOSIS — R69 Illness, unspecified: Secondary | ICD-10-CM | POA: Diagnosis not present

## 2017-08-28 DIAGNOSIS — I10 Essential (primary) hypertension: Secondary | ICD-10-CM

## 2017-08-28 DIAGNOSIS — Z967 Presence of other bone and tendon implants: Secondary | ICD-10-CM | POA: Diagnosis not present

## 2017-08-28 DIAGNOSIS — E118 Type 2 diabetes mellitus with unspecified complications: Secondary | ICD-10-CM | POA: Diagnosis not present

## 2017-08-28 DIAGNOSIS — Z720 Tobacco use: Secondary | ICD-10-CM | POA: Insufficient documentation

## 2017-08-28 DIAGNOSIS — S72141D Displaced intertrochanteric fracture of right femur, subsequent encounter for closed fracture with routine healing: Secondary | ICD-10-CM | POA: Insufficient documentation

## 2017-08-28 LAB — CBC WITH DIFFERENTIAL/PLATELET
Basophils Absolute: 0 10*3/uL (ref 0.0–0.1)
Basophils Relative: 0 %
Eosinophils Absolute: 0.3 10*3/uL (ref 0.0–0.7)
Eosinophils Relative: 3 %
HCT: 24.8 % — ABNORMAL LOW (ref 39.0–52.0)
HEMOGLOBIN: 8.3 g/dL — AB (ref 13.0–17.0)
LYMPHS ABS: 2 10*3/uL (ref 0.7–4.0)
Lymphocytes Relative: 22 %
MCH: 30.6 pg (ref 26.0–34.0)
MCHC: 33.5 g/dL (ref 30.0–36.0)
MCV: 91.5 fL (ref 78.0–100.0)
Monocytes Absolute: 0.8 10*3/uL (ref 0.1–1.0)
Monocytes Relative: 9 %
NEUTROS ABS: 5.9 10*3/uL (ref 1.7–7.7)
NEUTROS PCT: 66 %
Platelets: 161 10*3/uL (ref 150–400)
RBC: 2.71 MIL/uL — AB (ref 4.22–5.81)
RDW: 15.2 % (ref 11.5–15.5)
WBC: 9 10*3/uL (ref 4.0–10.5)

## 2017-08-28 LAB — BASIC METABOLIC PANEL
Anion gap: 6 (ref 5–15)
BUN: 43 mg/dL — AB (ref 6–20)
CALCIUM: 8.6 mg/dL — AB (ref 8.9–10.3)
CHLORIDE: 103 mmol/L (ref 101–111)
CO2: 29 mmol/L (ref 22–32)
CREATININE: 1.08 mg/dL (ref 0.61–1.24)
Glucose, Bld: 224 mg/dL — ABNORMAL HIGH (ref 65–99)
Potassium: 3.9 mmol/L (ref 3.5–5.1)
SODIUM: 138 mmol/L (ref 135–145)

## 2017-08-28 NOTE — Progress Notes (Signed)
Provider: Veleta Miners  Location:   [facility] Room Number: 155/P Place of Service:  SNF (31)  PCP: Sinda Du, MD Patient Care Team: Sinda Du, MD as PCP - General (Internal Medicine)  Extended Emergency Contact Information Primary Emergency Contact: Lau,Chyrl Address: [address]          [place], [place] [address] [place] Phone: [phone] Mobile Phone: [phone] Relation: Spouse  Code Status: Full Code Goals of Care: Advanced Directive information Advanced Directives 08/28/2017  Does Patient Have a Medical Advance Directive? Yes  Type of Advance Directive (No Data)  Does patient want to make changes to medical advance directive? No - Patient declined  Would patient like information on creating a medical advance directive? No - Patient declined      Chief Complaint  Patient presents with  . New Admit To SNF    New Admission Visit    HPI: Patient is a 81 y.o. male seen today for admission to SNF for therapy.  Patient has h/o  Diabetes Type 2 , CAD S/P Stent placement, CHF, Neuropathy, Hypertension, Hyperlipidemia, COPD with continuous Tobacco abuse.  Patient was admitted to hospital on 08/22/2017 after sustaining Fall in his home. He underwent intramedullary implant by Ortho on 9/29.Marland Kitchen Post op Patient had acute blood loss anemia and needed transfusion. He also had hypotension and his Lisinopril was discontinued. He also had worsening renal function. [place] of kidneys was normal.  He also had Incidental finding of Oropharynx mass on CT scan.   Patient doing well in facility. He has not worked with therapy yet. He feels weak and tired. Also continues to have severe pain in his hip. Needing Pain meds every 4-6 hours. He also has cough with sputum. But no Chest pain or fever or SOB. He lives with his wife. Was very active before surgery. Was driving. Will be discharged home.    Past Medical History:  Diagnosis Date    . Arthritis   . Constipation   . COPD (chronic obstructive pulmonary disease) ([place])   . Coronary artery disease   . Diabetes mellitus   . Hypertension   . Skin cancer of face    Past Surgical History:  Procedure Laterality Date  . APPENDECTOMY  1958  . BACK SURGERY    . CORONARY STENT PLACEMENT    . FEMUR IM NAIL Right 08/23/2017   Procedure: INTRAMEDULLARY (IM) NAIL FEMORAL;  Surgeon: Nicholes Stairs, MD;  Location: [facility];  Service: Orthopedics;  Laterality: Right;  . SKIN CANCER EXCISION  07/2017    reports that he has been smoking Cigarettes.  He has a 25.00 pack-year smoking history. He has never used smokeless tobacco. He reports that he does not drink alcohol or use drugs. Social History   Social History  . Marital status: Married    Spouse name: N/A  . Number of children: N/A  . Years of education: N/A   Occupational History  . Not on file.   Social History Main Topics  . Smoking status: Current Every Day Smoker    Packs/day: 1.00    Years: 25.00    Types: Cigarettes  . Smokeless tobacco: Never Used  . Alcohol use No  . Drug use: No  . Sexual activity: Not on file   Other Topics Concern  . Not on file   Social History Narrative  . No narrative on file    Functional Status Survey:    Family History  Problem Relation  Age of Onset  . Diabetes Mother     Health Maintenance  Topic Date Due  . FOOT EXAM  10/19/1948  . OPHTHALMOLOGY EXAM  10/19/1948  . URINE MICROALBUMIN  10/19/1948  . TETANUS/TDAP  10/19/1957  . PNA vac Low Risk Adult (1 of 2 - PCV13) 10/20/2003  . HEMOGLOBIN A1C  02/09/2018  . INFLUENZA VACCINE  Completed    No Known Allergies  Outpatient Encounter Prescriptions as of 08/28/2017  Medication Sig  . aspirin EC 81 MG tablet Take 1 tablet (81 mg total) by mouth 2 (two) times daily.  Roseanne Kaufman [place]-Castor Oil (VENELEX) OINT Apply to sacrum and bilateral buttocks q shift and prn erythema. Every shift  . cholecalciferol (VITAMIN  D) 1000 units tablet Take 5,000 Units by mouth daily.  Marland Kitchen gabapentin (NEURONTIN) 300 MG capsule Take 1 capsule (300 mg total) by mouth at bedtime.  . Gluc-Chonn-MSM-Boswellia-Vit D (GLUCOSAMINE CHOND TRIPLE/VIT D PO) Give 1 tablet by mouth once a day  . HYDROcodone-acetaminophen (NORCO) 5-325 MG tablet Take 1-2 tablets by mouth every 6 (six) hours as needed for moderate pain.  . magnesium gluconate (MAGONATE) 500 MG tablet Take 500 mg by mouth daily.  . metFORMIN (GLUCOPHAGE) 1000 MG tablet Take 1 tablet (1,000 mg total) by mouth 2 (two) times daily with a meal.  . methocarbamol (ROBAXIN) 500 MG tablet Take 1 tablet (500 mg total) by mouth every 6 (six) hours as needed for muscle spasms.  . metoprolol (LOPRESSOR) 50 MG tablet Take 100 mg by mouth 2 (two) times daily.   . Multiple Vitamin (MULTIVITAMIN WITH MINERALS) TABS tablet Take 1 tablet by mouth daily.  Marland Kitchen Phenyleph-CPM-DM-Aspirin (ALKA-SELTZER PLUS COLD & COUGH PO) Take 2 tablets by mouth daily as needed (nasal congestion).  . polyethylene glycol (MIRALAX / GLYCOLAX) packet Take 17 g by mouth daily.  . potassium chloride SA (K-DUR,KLOR-CON) 20 MEQ tablet Take 1 tablet (20 mEq total) by mouth daily.  . simvastatin (ZOCOR) 40 MG tablet Take 40 mg by mouth daily.  . tamsulosin (FLOMAX) 0.4 MG CAPS capsule Take 0.4 mg by mouth.  . trolamine salicylate (ASPERCREME) 10 % cream Apply 1 application topically as needed for muscle pain.  . [DISCONTINUED] OVER THE COUNTER MEDICATION Take 1 tablet by mouth daily. ostelo bi flex triple strength   No facility-administered encounter medications on file as of 08/28/2017.      Review of Systems  Review of Systems  Constitutional: Negative for activity change, appetite change, chills, diaphoresis, fatigue and fever.  HENT: Negative for mouth sores, postnasal drip, rhinorrhea, sinus pain and sore throat.   Respiratory: Negative for apnea, positive for cough, Negative for  chest tightness, shortness of  breath and wheezing.   Cardiovascular: Negative for chest pain, palpitations and leg swelling.  Gastrointestinal: Negative for abdominal distention, abdominal pain, constipation, diarrhea, nausea and vomiting.  Genitourinary: Negative for dysuria and frequency.  Musculoskeletal: Negative for arthralgias, joint swelling and myalgias.  Skin: Negative for rash.  Neurological: Negative for dizziness, syncope, weakness, light-headedness and numbness.  Psychiatric/Behavioral: Negative for behavioral problems, confusion and sleep disturbance.     Vitals:   08/28/17 1018  BP: 120/66  Pulse: 88  Resp: 20  Temp: 98.6 F (37 C)  TempSrc: Oral   There is no height or weight on file to calculate BMI. Physical Exam  Constitutional: He is oriented to person, place, and time. He appears well-developed and well-nourished.  HENT:  Head: Normocephalic.  Mouth/Throat: Oropharynx is clear and moist.  Eyes:  Pupils are equal, round, and reactive to light.  Neck: Neck supple.  Cardiovascular: Normal rate, regular rhythm and normal heart sounds.   No murmur heard. Pulmonary/Chest: Effort normal.  Had expiratory Wheezing bilateral  Abdominal: Soft. Bowel sounds are normal. He exhibits no distension. There is no tenderness. There is no rebound.  Musculoskeletal: He exhibits no edema.  Neurological: He is alert and oriented to person, place, and time.  No focal deficits except the right leg  Skin: Skin is warm and dry.  Psychiatric: He has a normal mood and affect. His behavior is normal. Thought content normal.    Labs reviewed: Basic Metabolic Panel:  Recent Labs  08/26/17 0423 08/27/17 0702 08/28/17 0615  NA 133* 137 138  K 3.3* 4.5 3.9  CL 103 106 103  CO2 23 25 29   GLUCOSE 195* 202* 224*  BUN 62* 49* 43*  CREATININE 1.42* 1.07 1.08  CALCIUM 8.2* 8.4* 8.6*   Liver Function Tests:  Recent Labs  02/04/17 1426 08/12/17 0923 08/23/17 0553 08/27/17 0702  AST 17 15 20 26   ALT 11  8* 13* 14*  ALKPHOS 72  --  67 61  BILITOT 0.7 0.4 0.9 1.2  PROT 6.7 6.4 6.0* 6.0*  ALBUMIN 3.7  --  3.2* 2.9*   No results for input(s): LIPASE, AMYLASE in the last 8760 hours. No results for input(s): AMMONIA in the last 8760 hours. CBC:  Recent Labs  08/22/17 0459  08/26/17 0423 08/27/17 0702 08/28/17 0615  WBC 11.0*  < > 9.5 8.0 9.0  NEUTROABS 8.2*  --   --   --  5.9  HGB 13.7  < > 7.8* 8.8* 8.3*  HCT 41.0  < > 22.7* 26.1* 24.8*  MCV 92.6  < > 87.0 89.7 91.5  PLT 146*  < > 126* 126* 161  < > = values in this interval not displayed. Cardiac Enzymes: No results for input(s): CKTOTAL, CKMB, CKMBINDEX, TROPONINI in the last 8760 hours. BNP: Invalid input(s): POCBNP Lab Results  Component Value Date   HGBA1C 6.0 (H) 08/12/2017   No results found for: TSH No results found for: VITAMINB12 No results found for: FOLATE No results found for: IRON, TIBC, FERRITIN  Imaging and Procedures obtained prior to SNF admission: No results found.  Assessment/Plan  S/P Intramedullary implant right femur Patient here for therapy. Would not increase his Norco right now as he is already seem little Sedated. Continue robaxin for pain with Norco.  Follow up with Ortho in 2 weeks. Per Ortho on aspirin fpr DVT prophylaxis.for 4 weeks and then QD.  Essential hypertension, benign His lisinopril was stopped due to Hypotension and Renal insufficiency. Continue on Metoprolol. His BP stable.  COPD Patient has finally agreed to try some Nebulizer treatment. Start on Duo nebs Q 6 hours PRN.  Type 2 diabetes mellitus with Neuropathy A1C in 09/18 was 6 Continue Metformin.  Accu Check BID Continue Neurontin   Acute kidney injury  His creat is back to baseline. BUN mildly elevated. Will continue to monitor. Acute Blood loss anemia Will Follow Hgb and start him on iron. Oro pharanyx mass Was found incidental on CT scan. Needs follow up with ENT at discharge. Smoking Patein says he has  quit smoking now.  Disposition Wants to go home with his wife. Family/ staff Communication:   Labs/tests ordered: CMP, CBC on Mon.  Total time spent in this patient care encounter was 45_ minutes; greater than 50% of the visit spent counseling patient, reviewing  records , Labs and coordinating care for problems addressed at this encounter.

## 2017-09-01 ENCOUNTER — Encounter (HOSPITAL_COMMUNITY)
Admission: RE | Admit: 2017-09-01 | Discharge: 2017-09-01 | Disposition: A | Discharge status: Home / Self Care | Payer: Medicare HMO | Source: Skilled Nursing Facility | Attending: *Deleted | Admitting: *Deleted

## 2017-09-01 LAB — COMPREHENSIVE METABOLIC PANEL
ALK PHOS: 77 U/L (ref 38–126)
ALT: 16 U/L — ABNORMAL LOW (ref 17–63)
AST: 18 U/L (ref 15–41)
Albumin: 3.1 g/dL — ABNORMAL LOW (ref 3.5–5.0)
Anion gap: 9 (ref 5–15)
BILIRUBIN TOTAL: 1.5 mg/dL — AB (ref 0.3–1.2)
BUN: 32 mg/dL — AB (ref 6–20)
CALCIUM: 9 mg/dL (ref 8.9–10.3)
CO2: 27 mmol/L (ref 22–32)
Chloride: 101 mmol/L (ref 101–111)
Creatinine, Ser: 1.07 mg/dL (ref 0.61–1.24)
GFR calc Af Amer: >60 mL/min (ref >60)
GFR calc non Af Amer: >60 mL/min (ref >60)
Glucose, Bld: 197 mg/dL — ABNORMAL HIGH (ref 65–99)
Potassium: 4.8 mmol/L (ref 3.5–5.1)
Sodium: 137 mmol/L (ref 135–145)
TOTAL PROTEIN: 6.7 g/dL (ref 6.5–8.1)

## 2017-09-01 LAB — CBC
HEMATOCRIT: 26.6 % — AB (ref 39.0–52.0)
HEMOGLOBIN: 8.6 g/dL — AB (ref 13.0–17.0)
MCH: 30.6 pg (ref 26.0–34.0)
MCHC: 32.3 g/dL (ref 30.0–36.0)
MCV: 94.7 fL (ref 78.0–100.0)
Platelets: 243 10*3/uL (ref 150–400)
RBC: 2.81 MIL/uL — ABNORMAL LOW (ref 4.22–5.81)
RDW: 16.6 % — ABNORMAL HIGH (ref 11.5–15.5)
WBC: 12.9 10*3/uL — AB (ref 4.0–10.5)

## 2017-09-02 ENCOUNTER — Non-Acute Institutional Stay (SKILLED_NURSING_FACILITY): Payer: Medicare HMO | Admitting: Internal Medicine

## 2017-09-02 ENCOUNTER — Encounter: Payer: Self-pay | Admitting: Internal Medicine

## 2017-09-02 ENCOUNTER — Encounter (HOSPITAL_COMMUNITY)
Admission: RE | Admit: 2017-09-02 | Discharge: 2017-09-02 | Disposition: A | Discharge status: Home / Self Care | Payer: Medicare HMO | Source: Skilled Nursing Facility | Attending: Internal Medicine | Admitting: Internal Medicine

## 2017-09-02 DIAGNOSIS — F17200 Nicotine dependence, unspecified, uncomplicated: Secondary | ICD-10-CM

## 2017-09-02 DIAGNOSIS — J189 Pneumonia, unspecified organism: Secondary | ICD-10-CM

## 2017-09-02 DIAGNOSIS — J181 Lobar pneumonia, unspecified organism: Secondary | ICD-10-CM | POA: Diagnosis not present

## 2017-09-02 DIAGNOSIS — R69 Illness, unspecified: Secondary | ICD-10-CM | POA: Diagnosis not present

## 2017-09-02 DIAGNOSIS — Z967 Presence of other bone and tendon implants: Secondary | ICD-10-CM

## 2017-09-02 DIAGNOSIS — Z8781 Personal history of (healed) traumatic fracture: Secondary | ICD-10-CM

## 2017-09-02 DIAGNOSIS — E118 Type 2 diabetes mellitus with unspecified complications: Secondary | ICD-10-CM

## 2017-09-02 DIAGNOSIS — Z9889 Other specified postprocedural states: Secondary | ICD-10-CM

## 2017-09-02 DIAGNOSIS — D62 Acute posthemorrhagic anemia: Secondary | ICD-10-CM

## 2017-09-02 LAB — URINALYSIS, ROUTINE W REFLEX MICROSCOPIC
BACTERIA UA: NONE SEEN
Bilirubin Urine: NEGATIVE
GLUCOSE, UA: NEGATIVE mg/dL
Hgb urine dipstick: NEGATIVE
KETONES UR: NEGATIVE mg/dL
LEUKOCYTES UA: NEGATIVE
Nitrite: NEGATIVE
PROTEIN: 30 mg/dL — AB
Specific Gravity, Urine: 1.016 (ref 1.005–1.030)
pH: 5 (ref 5.0–8.0)

## 2017-09-02 NOTE — Progress Notes (Signed)
Location:   [facility] Room Number: 155/P Place of Service:  SNF (31) Provider:  Quintella Baton, MD  Patient Care Team: Sinda Du, MD as PCP - General (Internal Medicine)  Extended Emergency Contact Information Primary Emergency Contact: Bangs,Chyrl Address: [address]          [place], [place] [address] [place] Phone: [phone] Mobile Phone: [phone] Relation: Spouse  Code Status:  Full Code Goals of care: Advanced Directive information Advanced Directives 09/02/2017  Does Patient Have a Medical Advance Directive? Yes  Type of Advance Directive (No Data)  Does patient want to make changes to medical advance directive? No - Patient declined  Would patient like information on creating a medical advance directive? No - Patient declined     Chief Complaint  Patient presents with  . Acute Visit    F/U Pneumonia    HPI:  Pt is a 81 y.o. male seen today for an acute visit for Follow up on Pneumonia. Patient has h/o  Diabetes Type 2 , CAD S/P Stent placement, CHF, Neuropathy, Hypertension, Hyperlipidemia, COPD with continuous Tobacco abuse.  Patient was admitted to SNF for therapy after undergoing Intramedullary implant for right Femur on 09/29 after sustaining Femoral Fracture. Patient was not feeling well yesterday. He was coughing productive sputum. No chest pain.. Mild SOB. He also had low grade fever. A chest xray was ordered and it showed Left Lower lobe Pneumonia and he was started on Levaquin last Night. Patient still has Productive Cough. He denies any SOB, Chest pain. Low grade fever. He did c/o feeling anxious and inability to sleep at night.Appetite still stays Poor.   Past Medical History:  Diagnosis Date  . Arthritis   . Constipation   . COPD (chronic obstructive pulmonary disease) ([place])   . Coronary artery disease   . Diabetes mellitus   . Hypertension   . Skin cancer of face    Past  Surgical History:  Procedure Laterality Date  . APPENDECTOMY  1958  . BACK SURGERY    . CORONARY STENT PLACEMENT    . FEMUR IM NAIL Right 08/23/2017   Procedure: INTRAMEDULLARY (IM) NAIL FEMORAL;  Surgeon: Nicholes Stairs, MD;  Location: [facility];  Service: Orthopedics;  Laterality: Right;  . SKIN CANCER EXCISION  07/2017    No Known Allergies  Outpatient Encounter Prescriptions as of 09/02/2017  Medication Sig  . aspirin EC 81 MG tablet Take 1 tablet (81 mg total) by mouth 2 (two) times daily.  Roseanne Kaufman Peru-Castor Oil (VENELEX) OINT Apply to sacrum and bilateral buttocks q shift and prn erythema. Every shift  . cholecalciferol (VITAMIN D) 1000 units tablet Take 5,000 Units by mouth daily.  Marland Kitchen gabapentin (NEURONTIN) 300 MG capsule Take 1 capsule (300 mg total) by mouth at bedtime.  Marland Kitchen guaiFENesin (MUCINEX) 600 MG 12 hr tablet Take 600 mg by mouth 2 (two) times daily.  Marland Kitchen HYDROcodone-acetaminophen (NORCO) 5-325 MG tablet Take 1-2 tablets by mouth every 6 (six) hours as needed for moderate pain.  Marland Kitchen ipratropium-albuterol (DUONEB) 0.5-2.5 (3) MG/3ML SOLN Take 3 mLs by nebulization every 6 (six) hours as needed.  Marland Kitchen levofloxacin (LEVAQUIN) 500 MG tablet Take 500 mg by mouth daily.  . magnesium gluconate (MAGONATE) 500 MG tablet Take 500 mg by mouth daily.  . metFORMIN (GLUCOPHAGE) 1000 MG tablet Take 1 tablet (1,000 mg total) by mouth 2 (two) times daily with a meal.  . methocarbamol (ROBAXIN) 500 MG tablet  Take 1 tablet (500 mg total) by mouth every 6 (six) hours as needed for muscle spasms.  . metoprolol (LOPRESSOR) 50 MG tablet Take 100 mg by mouth 2 (two) times daily.   . Misc Natural Products (OSTEO BI-FLEX ADV TRIPLE ST PO) Take 1 tablet by mouth once a day  . Multiple Vitamin (MULTIVITAMIN WITH MINERALS) TABS tablet Take 1 tablet by mouth daily.  . ondansetron (ZOFRAN) 4 MG tablet Take 4 mg by mouth every 6 (six) hours as needed for nausea or vomiting.  Marland Kitchen Phenyleph-CPM-DM-Aspirin  (ALKA-SELTZER PLUS COLD & COUGH PO) Take 2 tablets by mouth daily as needed (nasal congestion).  . polyethylene glycol (MIRALAX / GLYCOLAX) packet Take 17 g by mouth daily.  . potassium chloride SA (K-DUR,KLOR-CON) 20 MEQ tablet Take 1 tablet (20 mEq total) by mouth daily.  . simvastatin (ZOCOR) 40 MG tablet Take 40 mg by mouth daily.  . tamsulosin (FLOMAX) 0.4 MG CAPS capsule Take 0.4 mg by mouth.  . trolamine salicylate (ASPERCREME) 10 % cream Apply 1 application topically as needed for muscle pain.  . [DISCONTINUED] Gluc-Chonn-MSM-Boswellia-Vit D (GLUCOSAMINE CHOND TRIPLE/VIT D PO) Give 1 tablet by mouth once a day   No facility-administered encounter medications on file as of 09/02/2017.      Review of Systems  Constitutional: Positive for activity change, appetite change and fatigue.  HENT: Negative.   Respiratory: Positive for cough and shortness of breath.   Cardiovascular: Negative.   Gastrointestinal: Positive for nausea.  Genitourinary: Negative.   Musculoskeletal: Negative.   Skin: Negative.   Neurological: Negative.   Psychiatric/Behavioral: Positive for sleep disturbance.  All other systems reviewed and are negative.   Immunization History  Administered Date(s) Administered  . Influenza-Unspecified 08/11/2017   Pertinent  Health Maintenance Due  Topic Date Due  . FOOT EXAM  10/03/2017 (Originally 10/19/1946)  . OPHTHALMOLOGY EXAM  10/03/2017 (Originally 10/19/1946)  . URINE MICROALBUMIN  10/03/2017 (Originally 10/19/1946)  . PNA vac Low Risk Adult (1 of 2 - PCV13) 10/03/2017 (Originally 10/20/2003)  . HEMOGLOBIN A1C  02/09/2018  . INFLUENZA VACCINE  Completed   Fall Risk  02/13/2017 05/06/2016 02/02/2016 10/24/2015  Falls in the past year? No No No No   Functional Status Survey:    Vitals:   09/02/17 1712  BP: (!) 107/50  Pulse: 85  Resp: 18  Temp: 99.7 F (37.6 C)  SpO2: 96%   There is no height or weight on file to calculate BMI. Physical Exam    Constitutional: He is oriented to person, place, and time. He appears well-developed and well-nourished.  HENT:  Head: Normocephalic.  Mouth/Throat: Oropharynx is clear and moist.  Eyes: Pupils are equal, round, and reactive to light.  Neck: Neck supple.  Cardiovascular: Normal rate and normal heart sounds.   Pulmonary/Chest: Effort normal.  Rhonchi in Left Lower base  Abdominal: Soft. Bowel sounds are normal. He exhibits no distension. There is no tenderness. There is no rebound.  Musculoskeletal: He exhibits no edema.  Neurological: He is alert and oriented to person, place, and time.  Skin: Skin is warm and dry.  Psychiatric: He has a normal mood and affect. His behavior is normal.    Labs reviewed:  Recent Labs  08/27/17 0702 08/28/17 0615 09/01/17 0600  NA 137 138 137  K 4.5 3.9 4.8  CL 106 103 101  CO2 25 29 27   GLUCOSE 202* 224* 197*  BUN 49* 43* 32*  CREATININE 1.07 1.08 1.07  CALCIUM 8.4* 8.6* 9.0  Recent Labs  08/23/17 0553 08/27/17 0702 09/01/17 0600  AST 20 26 18   ALT 13* 14* 16*  ALKPHOS 67 61 77  BILITOT 0.9 1.2 1.5*  PROT 6.0* 6.0* 6.7  ALBUMIN 3.2* 2.9* 3.1*    Recent Labs  08/22/17 0459  08/27/17 0702 08/28/17 0615 09/01/17 0600  WBC 11.0*  < > 8.0 9.0 12.9*  NEUTROABS 8.2*  --   --  5.9  --   HGB 13.7  < > 8.8* 8.3* 8.6*  HCT 41.0  < > 26.1* 24.8* 26.6*  MCV 92.6  < > 89.7 91.5 94.7  PLT 146*  < > 126* 161 243  < > = values in this interval not displayed. No results found for: TSH Lab Results  Component Value Date   HGBA1C 6.0 (H) 08/12/2017   Lab Results  Component Value Date   CHOL 99 (L) 07/30/2016   HDL 42 07/30/2016   LDLCALC 26 07/30/2016   TRIG 157 (H) 07/30/2016   CHOLHDL 2.4 07/30/2016    Significant Diagnostic Results in last 30 days:  Dg Chest 1 View  Result Date: 08/22/2017 CLINICAL DATA:  Trip and fall.  Left hip pain. EXAM: CHEST 1 VIEW COMPARISON:  Radiographs 07/22/2011. Lung bases from abdominal CT  08/11/2012 FINDINGS: The heart is enlarged. There is bilateral hilar prominence, right greater than left. Atherosclerosis of the aortic arch. Diffuse interstitial coarsening with ill-defined reticular opacities in the periphery both lungs and at the left lung base. No pneumothorax or confluent airspace disease. No pleural fluid. Surgical hardware in the lower cervical spine. No evidence of acute rib fracture. IMPRESSION: 1. Cardiomegaly with atherosclerotic aorta. Bilateral hilar prominence suggest pulmonary arterial hypertension. Adenopathy not entirely excluded. 2. Peripheral define reticular opacities throughout both lungs and the left lung base, suggesting interstitial lung disease and pulmonary fibrosis. Recommend chest CT characterization, high-resolution chest CT could be considered for interstitial lung disease evaluation. This has progressed from prior exams. Electronically Signed   By: Jeb Levering M.D.   On: 08/22/2017 05:25   Ct Head Wo Contrast  Result Date: 08/22/2017 CLINICAL DATA:  81 y/o  M; status post fall. EXAM: CT HEAD WITHOUT CONTRAST CT CERVICAL SPINE WITHOUT CONTRAST TECHNIQUE: Multidetector CT imaging of the head and cervical spine was performed following the standard protocol without intravenous contrast. Multiplanar CT image reconstructions of the cervical spine were also generated. COMPARISON:  None. FINDINGS: CT HEAD FINDINGS Brain: No evidence of acute infarction, hemorrhage, hydrocephalus, extra-axial collection or mass lesion/mass effect. Chronic right parietal cortical infarction. Small chronic lacunar infarctions within bilateral lentiform nuclei, left caudate head, and right thalamus. Moderate chronic microvascular ischemic changes and parenchymal volume loss of the brain. Vascular: Extensive calcific atherosclerosis of carotid siphons. Skull: Normal. Negative for fracture or focal lesion. Sinuses/Orbits: Paranasal sinus disease greatest in right maxillary sinus with there  is moderate mucosal thickening and aerosolized secretions. Normal aeration of mastoid air cells. Bilateral intra-ocular lens replacement. Other: None. CT CERVICAL SPINE FINDINGS Alignment: Grade 1 C3-4 retrolisthesis, grade 1 C2-3 anterolisthesis, grade 1 C7-T1 anterolisthesis. Skull base and vertebrae: The C4-C6 anterior cervical discectomy solid interbody fusion. No acute fracture or loss of vertebral body height identified. 9 mm ossification within the right C6-7 ligamentum flavum. Soft tissues and spinal canal:  Negative. Disc levels: Severe adjacent segment disease at the C3-4 and C6-7 levels with loss of disc space height. Disc and facet disease contributes to at least moderate canal stenosis at C3-4 and C6-7 levels. Bilateral bony foraminal stenosis  greatest at the C3-4 and C4-5 levels due to uncovertebral and facet hypertrophy. Upper chest: Negative. Other: The calcification of right palatine tonsil compatible sequelae prior infection. Low-attenuation mass within the right lateral oropharynx mucosa measuring 12 x 16 x 20 mm (AP x ML x CC series 8, image 39 and series 10, image 13). IMPRESSION: CT head: 1. No acute intracranial abnormality or calvarial fracture. 2. Small chronic right parietal cortical infarction. Small chronic lacunar infarcts in basal ganglia. 3. Moderate chronic microvascular ischemic changes and parenchymal volume loss of the brain. 4. Paranasal sinus disease with mucosal thickening and aerosolized secretions in the right maxillary sinus which may represent acute sinusitis. CT cervical spine: 1. No acute fracture or dislocation identified. 2. C4-C6 anterior cervical discectomy and fusion. 3. Cervical spondylosis with severe adjacent segment disease at the C3-4 and C6-7 levels where there is at least moderate canal stenosis. 4. Low-attenuation well-circumscribed mass within the mucosa of right lateral oropharynx measuring up to 20 mm. Direct visualization recommended. Electronically Signed    By: Kristine Garbe M.D.   On: 08/22/2017 05:29   Ct Cervical Spine Wo Contrast  Result Date: 08/22/2017 CLINICAL DATA:  80 y/o  M; status post fall. EXAM: CT HEAD WITHOUT CONTRAST CT CERVICAL SPINE WITHOUT CONTRAST TECHNIQUE: Multidetector CT imaging of the head and cervical spine was performed following the standard protocol without intravenous contrast. Multiplanar CT image reconstructions of the cervical spine were also generated. COMPARISON:  None. FINDINGS: CT HEAD FINDINGS Brain: No evidence of acute infarction, hemorrhage, hydrocephalus, extra-axial collection or mass lesion/mass effect. Chronic right parietal cortical infarction. Small chronic lacunar infarctions within bilateral lentiform nuclei, left caudate head, and right thalamus. Moderate chronic microvascular ischemic changes and parenchymal volume loss of the brain. Vascular: Extensive calcific atherosclerosis of carotid siphons. Skull: Normal. Negative for fracture or focal lesion. Sinuses/Orbits: Paranasal sinus disease greatest in right maxillary sinus with there is moderate mucosal thickening and aerosolized secretions. Normal aeration of mastoid air cells. Bilateral intra-ocular lens replacement. Other: None. CT CERVICAL SPINE FINDINGS Alignment: Grade 1 C3-4 retrolisthesis, grade 1 C2-3 anterolisthesis, grade 1 C7-T1 anterolisthesis. Skull base and vertebrae: The C4-C6 anterior cervical discectomy solid interbody fusion. No acute fracture or loss of vertebral body height identified. 9 mm ossification within the right C6-7 ligamentum flavum. Soft tissues and spinal canal:  Negative. Disc levels: Severe adjacent segment disease at the C3-4 and C6-7 levels with loss of disc space height. Disc and facet disease contributes to at least moderate canal stenosis at C3-4 and C6-7 levels. Bilateral bony foraminal stenosis greatest at the C3-4 and C4-5 levels due to uncovertebral and facet hypertrophy. Upper chest: Negative. Other: The  calcification of right palatine tonsil compatible sequelae prior infection. Low-attenuation mass within the right lateral oropharynx mucosa measuring 12 x 16 x 20 mm (AP x ML x CC series 8, image 39 and series 10, image 13). IMPRESSION: CT head: 1. No acute intracranial abnormality or calvarial fracture. 2. Small chronic right parietal cortical infarction. Small chronic lacunar infarcts in basal ganglia. 3. Moderate chronic microvascular ischemic changes and parenchymal volume loss of the brain. 4. Paranasal sinus disease with mucosal thickening and aerosolized secretions in the right maxillary sinus which may represent acute sinusitis. CT cervical spine: 1. No acute fracture or dislocation identified. 2. C4-C6 anterior cervical discectomy and fusion. 3. Cervical spondylosis with severe adjacent segment disease at the C3-4 and C6-7 levels where there is at least moderate canal stenosis. 4. Low-attenuation well-circumscribed mass within the mucosa of  right lateral oropharynx measuring up to 20 mm. Direct visualization recommended. Electronically Signed   By: Kristine Garbe M.D.   On: 08/22/2017 05:29   US Renal  Result Date: 08/25/2017 CLINICAL DATA:  81 year old hypertensive diabetic male with acute kidney insufficiency. Initial encounter. EXAM: RENAL / URINARY TRACT ULTRASOUND COMPLETE COMPARISON:  08/11/2012 CT. FINDINGS: Right Kidney: Length: 12 cm. Renal parenchymal thinning. No hydronephrosis or mass. Left Kidney: Length: 11.6 cm. Renal parenchymal thinning. No hydronephrosis or mass. Bladder: Appears normal for degree of bladder distention. IMPRESSION: Bilateral renal parenchymal thinning. No hydronephrosis. Electronically Signed   By: Genia Del M.D.   On: 08/25/2017 07:01   Dg C-arm 1-60 Min  Result Date: 08/23/2017 CLINICAL DATA:  Status post ORIF of proximal femur fracture. EXAM: DG C-ARM 61-120 MIN; RIGHT FEMUR 2 VIEWS COMPARISON:  Earlier today FINDINGS: The patient has undergone  open reduction and internal fixation of the proximal right femur fracture. An intramedullary rod and hip screw has been placed. The hardware components and fracture fragments appear to be in near anatomic alignment. IMPRESSION: 1. Status post ORIF of proximal right femur fracture. Electronically Signed   By: Kerby Moors M.D.   On: 08/23/2017 16:16   Dg Hip Unilat W Or Wo Pelvis 2-3 Views Right  Result Date: 08/22/2017 CLINICAL DATA:  81 y/o M; status post fall with severe right hip pain. EXAM: DG HIP (WITH OR WITHOUT PELVIS) 2-3V RIGHT COMPARISON:  None. FINDINGS: The acute intertrochanteric fracture of the right proximal femur extending into the proximal diaphysis with apex lateral angulation and minimal displacement. No dislocation of the hip joint. No pelvic fracture or diastases identified. Vascular calcifications for IMPRESSION: Acute intertrochanteric fracture right proximal femur extending into proximal diaphysis with apex lateral angulation and minimal displacement. Electronically Signed   By: Kristine Garbe M.D.   On: 08/22/2017 05:20   Dg Femur, Min 2 Views Right  Result Date: 08/23/2017 CLINICAL DATA:  Status post ORIF of proximal femur fracture. EXAM: DG C-ARM 61-120 MIN; RIGHT FEMUR 2 VIEWS COMPARISON:  Earlier today FINDINGS: The patient has undergone open reduction and internal fixation of the proximal right femur fracture. An intramedullary rod and hip screw has been placed. The hardware components and fracture fragments appear to be in near anatomic alignment. IMPRESSION: 1. Status post ORIF of proximal right femur fracture. Electronically Signed   By: Kerby Moors M.D.   On: 08/23/2017 16:16   Dg Femur Port, Min 2 Views Right  Result Date: 08/23/2017 CLINICAL DATA:  Right IM nail EXAM: RIGHT FEMUR PORTABLE 2 VIEW COMPARISON:  08/23/2017 FINDINGS: Intramedullary rod and hip screw reduces the obliquely oriented fracture involving the proximal diaphysis of the right femur.  The hardware components and fracture fragments are in anatomic alignment. IMPRESSION: 1. Status post ORIF of right femur fracture. Electronically Signed   By: Kerby Moors M.D.   On: 08/23/2017 19:17   Dg Femur Port, Min 2 Views Right  Result Date: 08/22/2017 CLINICAL DATA:  Known proximal right femoral fracture EXAM: RIGHT FEMUR PORTABLE 2 VIEW COMPARISON:  Pelvic film from earlier in the same day. FINDINGS: Comminuted intratrochanteric fracture is noted extending into the proximal metaphysis. Angulation at the fracture site is noted similar to that seen on the prior exam. No distal fracture is seen. Degenerative changes at the knee joint are noted. IMPRESSION: Comminuted proximal right femoral fracture as described. Electronically Signed   By: Inez Catalina M.D.   On: 08/22/2017 08:03    Assessment/Plan  LLL  Pneumonia With Leucocytosis. Will continue on Levaquin POX QD.  COPD Also continue on Duo nebs. Q 6  S/P Intramedullary implant right femur Patient is doing therapy .  Pain controlled on Norco and Robaxin. Follow up with Ortho.  Benign Hypertension His Lisinopril was stopped in the hospital. Will also decrease the Metoprolol to 75 mg BID.as BP is low and patient feels weak. Continue to monitor BP  Type 2 diabetes mellitus with Neuropathy A1C in 09/18 was 6 On Metformin. Continue Accu checks  Acute Blood loss anemia Hgb Stable on Iron  Oro pharanyx mass Was found incidental on CT scan. Needs follow up with ENT at discharge.  Smoking Patein says he has quit smoking now. He is not sleeping well and is very Anxious and wants to know if he can get something for anxiety Will try Ativan 0.5 mg QHS.  Disposition Wants to go home with his wife.  Family/ staff Communication:    Family/ staff Communication:   Labs/tests ordered:  Follow up CBC and BMP.  Total time spent in this patient care encounter was 45_ minutes; greater than 50% of the visit spent counseling  patient, reviewing records , Labs and coordinating care for problems addressed at this encounter.

## 2017-09-03 LAB — URINE CULTURE: CULTURE: NO GROWTH

## 2017-09-09 ENCOUNTER — Encounter (HOSPITAL_COMMUNITY)
Admission: RE | Admit: 2017-09-09 | Discharge: 2017-09-09 | Disposition: A | Discharge status: Home / Self Care | Payer: Medicare HMO | Source: Skilled Nursing Facility | Attending: *Deleted | Admitting: *Deleted

## 2017-09-09 LAB — CBC
HEMATOCRIT: 29.7 % — AB (ref 39.0–52.0)
Hemoglobin: 9.4 g/dL — ABNORMAL LOW (ref 13.0–17.0)
MCH: 31.3 pg (ref 26.0–34.0)
MCHC: 31.6 g/dL (ref 30.0–36.0)
MCV: 99 fL (ref 78.0–100.0)
Platelets: 250 10*3/uL (ref 150–400)
RBC: 3 MIL/uL — ABNORMAL LOW (ref 4.22–5.81)
RDW: 18.2 % — AB (ref 11.5–15.5)
WBC: 7.9 10*3/uL (ref 4.0–10.5)

## 2017-09-09 LAB — BASIC METABOLIC PANEL
ANION GAP: 12 (ref 5–15)
BUN: 40 mg/dL — AB (ref 6–20)
CALCIUM: 9.2 mg/dL (ref 8.9–10.3)
CO2: 23 mmol/L (ref 22–32)
Chloride: 101 mmol/L (ref 101–111)
Creatinine, Ser: 1.21 mg/dL (ref 0.61–1.24)
GFR calc Af Amer: >60 mL/min (ref >60)
GFR calc non Af Amer: 56 mL/min — ABNORMAL LOW (ref >60)
GLUCOSE: 125 mg/dL — AB (ref 65–99)
POTASSIUM: 4.9 mmol/L (ref 3.5–5.1)
Sodium: 136 mmol/L (ref 135–145)

## 2017-09-17 ENCOUNTER — Other Ambulatory Visit: Payer: Self-pay

## 2017-09-17 MED ORDER — HYDROCODONE-ACETAMINOPHEN 5-325 MG PO TABS: 1.0000 | ORAL_TABLET | Freq: Four times a day (QID) | ORAL | 0 refills | Status: DC | PRN

## 2017-09-17 NOTE — Telephone Encounter (Signed)
RX Fax for [facility]@ [phone]  

## 2017-09-22 ENCOUNTER — Non-Acute Institutional Stay (SKILLED_NURSING_FACILITY): Payer: Medicare HMO | Admitting: Internal Medicine

## 2017-09-22 ENCOUNTER — Encounter: Payer: Self-pay | Admitting: Internal Medicine

## 2017-09-22 DIAGNOSIS — E118 Type 2 diabetes mellitus with unspecified complications: Secondary | ICD-10-CM | POA: Diagnosis not present

## 2017-09-22 DIAGNOSIS — Z967 Presence of other bone and tendon implants: Secondary | ICD-10-CM

## 2017-09-22 DIAGNOSIS — R21 Rash and other nonspecific skin eruption: Secondary | ICD-10-CM | POA: Diagnosis not present

## 2017-09-22 DIAGNOSIS — J189 Pneumonia, unspecified organism: Secondary | ICD-10-CM

## 2017-09-22 DIAGNOSIS — G4700 Insomnia, unspecified: Secondary | ICD-10-CM | POA: Diagnosis not present

## 2017-09-22 DIAGNOSIS — Z8781 Personal history of (healed) traumatic fracture: Secondary | ICD-10-CM

## 2017-09-22 DIAGNOSIS — R17 Unspecified jaundice: Secondary | ICD-10-CM | POA: Diagnosis not present

## 2017-09-22 DIAGNOSIS — Z9889 Other specified postprocedural states: Secondary | ICD-10-CM

## 2017-09-22 DIAGNOSIS — J181 Lobar pneumonia, unspecified organism: Secondary | ICD-10-CM

## 2017-09-22 DIAGNOSIS — I9589 Other hypotension: Secondary | ICD-10-CM | POA: Diagnosis not present

## 2017-09-22 NOTE — Progress Notes (Signed)
Location:   [facility] Room Number: 155/P Place of Service:  SNF (31) Provider:  Terisa Starr, MD  Patient Care Team: Sinda Du, MD as PCP - General (Internal Medicine)  Extended Emergency Contact Information Primary Emergency Contact: Gaus,Chyrl Address: [address]          [place], [place] [address] [place] Phone: [phone] Mobile Phone: [phone] Relation: Spouse  Code Status:  Full Code Goals of care: Advanced Directive information Advanced Directives 09/22/2017  Does Patient Have a Medical Advance Directive? Yes  Type of Advance Directive (No Data)  Does patient want to make changes to medical advance directive? No - Patient declined  Would patient like information on creating a medical advance directive? No - Patient declined     Chief Complaint  Patient presents with  . Acute Visit    Needs sleep aid  Also complains of leg rash-  HPI:  Pt is a 81 y.o. male seen today for an acute visit for complaints of persistent insomnia-also nursing staff has noted a rash on his legs and he complains of itching.  Patient has a history of diabetes type 2 coronary artery disease status post stent-CHF-neuropathy as well as hypertension and hyperlipidemia COPD and tobacco abuse.  Most recent acute issue was left lower lobe pneumonia his completed antibiotic for this and this appears to be improved he is not really complaining cough or shortness of breath today his completed a course of Levaquin he continues on duo nebs as needed.  Uterus he came here for rehabilitation after undergoing an IM implant for right femur fracture sustained after a fall.  He continues on aspirin for DVT prophylaxis also has Robaxin and Norco as needed.  He has complained of some persistent insomnia he did receive a short course of Ativan at night but apparently this has not really helped much and apparently said a couple falls as  well.  I did discuss using melatonin in he and his wife are agreeable to try this.  Also apparently this morning nursing staff noted an erythematous rash on his upper thighs bilaterally he says it itches-he thinks this may be more of a contact issue with the soap used in the facility.  Regards to other medical issues he appears to be doing relatively well he did have some hypotension in the hospital lisinopril was discontinued-Dr. Lyndel Safe did further decrease his Lopressor secondary to continued low readings at this facility.  I got a manual pressure of 130/60 today recent other readings 117/63-103/48 I suspect these were done by machine-.  I also note per chart review his bilirubin was mildly elevated on recent lab at 1.5 we will update this is not complaining of any abdominal pain or increased jaundice.  Currently he is sitting in his wheelchair comfortably visiting with his wife is quite supportive.  He says he continues to feel weak but is working with therapy.     Past Medical History:  Diagnosis Date  . Arthritis   . Constipation   . COPD (chronic obstructive pulmonary disease) ([place])   . Coronary artery disease   . Diabetes mellitus   . Hypertension   . Skin cancer of face    Past Surgical History:  Procedure Laterality Date  . APPENDECTOMY  1958  . BACK SURGERY    . CORONARY STENT PLACEMENT    . FEMUR IM NAIL Right 08/23/2017   Procedure: INTRAMEDULLARY (IM) NAIL FEMORAL;  Surgeon: Victorino December  Saralyn Pilar, MD;  Location: [facility];  Service: Orthopedics;  Laterality: Right;  . SKIN CANCER EXCISION  07/2017    No Known Allergies  Outpatient Encounter Prescriptions as of 09/22/2017  Medication Sig  . aspirin EC 81 MG tablet Take 1 tablet (81 mg total) by mouth 2 (two) times daily.  Roseanne Kaufman [place]-Castor Oil (VENELEX) OINT Apply to sacrum and bilateral buttocks q shift and prn erythema. Every shift  . cholecalciferol (VITAMIN D) 1000 units tablet Take 5,000 Units by mouth  daily.  Marland Kitchen gabapentin (NEURONTIN) 300 MG capsule Take 1 capsule (300 mg total) by mouth at bedtime.  Marland Kitchen HYDROcodone-acetaminophen (NORCO) 5-325 MG tablet Take 1-2 tablets by mouth every 6 (six) hours as needed for moderate pain.  Marland Kitchen ipratropium-albuterol (DUONEB) 0.5-2.5 (3) MG/3ML SOLN Take 3 mLs by nebulization every 6 (six) hours as needed.  . magnesium gluconate (MAGONATE) 500 MG tablet Take 500 mg by mouth daily.  . metFORMIN (GLUCOPHAGE) 1000 MG tablet Take 1 tablet (1,000 mg total) by mouth 2 (two) times daily with a meal.  . methocarbamol (ROBAXIN) 500 MG tablet Take 1 tablet (500 mg total) by mouth every 6 (six) hours as needed for muscle spasms.  . Metoprolol Tartrate 75 MG TABS Take 75 mg by mouth 2 (two) times daily.  . Misc Natural Products (OSTEO BI-FLEX ADV TRIPLE ST PO) Take 1 tablet by mouth once a day  . Multiple Vitamin (MULTIVITAMIN WITH MINERALS) TABS tablet Take 1 tablet by mouth daily.  . ondansetron (ZOFRAN) 4 MG tablet Take 4 mg by mouth every 6 (six) hours as needed for nausea or vomiting.  Marland Kitchen Phenyleph-CPM-DM-Aspirin (ALKA-SELTZER PLUS COLD & COUGH PO) Take 2 tablets by mouth daily as needed (nasal congestion).  . polyethylene glycol (MIRALAX / GLYCOLAX) packet Take 17 g by mouth daily.  . simvastatin (ZOCOR) 40 MG tablet Take 40 mg by mouth daily.  . tamsulosin (FLOMAX) 0.4 MG CAPS capsule Take 0.4 mg by mouth.  . trolamine salicylate (ASPERCREME) 10 % cream Apply 1 application topically as needed for muscle pain.  . [DISCONTINUED] guaiFENesin (MUCINEX) 600 MG 12 hr tablet Take 600 mg by mouth 2 (two) times daily.  . [DISCONTINUED] levofloxacin (LEVAQUIN) 500 MG tablet Take 500 mg by mouth daily.  . [DISCONTINUED] metoprolol (LOPRESSOR) 50 MG tablet Take 100 mg by mouth 2 (two) times daily.   . [DISCONTINUED] potassium chloride SA (K-DUR,KLOR-CON) 20 MEQ tablet Take 1 tablet (20 mEq total) by mouth daily.   No facility-administered encounter medications on file as of  09/22/2017.     Review of Systems   In general is not complaining of fever chills continues to complain of some fatigue and weakness however.  Skin does not complain of diaphoresis has noticed a rash on his thighs bilaterally that itches.  Head ears eyes nose mouth and throat does not complaining of sore throat or visual changes.  Respiratory is not complaining of shortness breath or cough has completed antibiotic for left lower lobe pneumonia.  Cardiac does not complaining of chest pain or increased lower extremity edema from baseline.  GI does not complain of abdominal discomfort nausea vomiting diarrhea constipation says his appetite continues to be somewhat poor.  Musculoskeletal is complaining of weakness but is not really complaining of significant pain at this time.  Neurologic again has weakness complaints but does not complaining of syncope dizziness at this time or headache.  Psych continues to complain of insomnia does not complain of feeling depressed or anxious at  this time  Immunization History  Administered Date(s) Administered  . Influenza-Unspecified 08/11/2017   Pertinent  Health Maintenance Due  Topic Date Due  . FOOT EXAM  10/03/2017 (Originally 10/19/1946)  . OPHTHALMOLOGY EXAM  10/03/2017 (Originally 10/19/1946)  . URINE MICROALBUMIN  10/03/2017 (Originally 10/19/1946)  . PNA vac Low Risk Adult (1 of 2 - PCV13) 10/03/2017 (Originally 10/20/2003)  . HEMOGLOBIN A1C  02/09/2018  . INFLUENZA VACCINE  Completed   Fall Risk  02/13/2017 05/06/2016 02/02/2016 10/24/2015  Falls in the past year? No No No No   Functional Status Survey:     He is afebrile pulse of 82 respirations 16 blood pressure taken manually 130/60 weight is 168.5 Physical Exam In general this is a fairly well-nourished elderly male in no distress sitting comfortably in his wheelchair.  His skin is warm and dry he does have a macular papular rash on his anterior thighs bilaterally I did not  really note rashes elsewhere.  Eyes sclerae and conjunctivae are clear visual acuity appears grossly intact.  Oropharynx is clear mucous membranes moist.  Chest is clear to auscultation there is no labored breathing.  Heart is regular rate and rhythm without murmur gallop he has minimal lower extremity edema.  Abdomen somewhat obese soft nontender with positive bowel sounds.  Musculoskeletal is able to move all extremities 4 currently ambulating in a wheelchair  Neurologic is grossly intact he is alert cannot really appreciate lateralizing findings.  Speech is clear.  Psych is grossly alert and oriented pleasant and appropriate Labs reviewed:  Recent Labs  08/28/17 0615 09/01/17 0600 09/09/17 0902  NA 138 137 136  K 3.9 4.8 4.9  CL 103 101 101  CO2 29 27 23   GLUCOSE 224* 197* 125*  BUN 43* 32* 40*  CREATININE 1.08 1.07 1.21  CALCIUM 8.6* 9.0 9.2    Recent Labs  08/23/17 0553 08/27/17 0702 09/01/17 0600  AST 20 26 18   ALT 13* 14* 16*  ALKPHOS 67 61 77  BILITOT 0.9 1.2 1.5*  PROT 6.0* 6.0* 6.7  ALBUMIN 3.2* 2.9* 3.1*    Recent Labs  08/22/17 0459  08/28/17 0615 09/01/17 0600 09/09/17 0902  WBC 11.0*  < > 9.0 12.9* 7.9  NEUTROABS 8.2*  --  5.9  --   --   HGB 13.7  < > 8.3* 8.6* 9.4*  HCT 41.0  < > 24.8* 26.6* 29.7*  MCV 92.6  < > 91.5 94.7 99.0  PLT 146*  < > 161 243 250  < > = values in this interval not displayed. No results found for: TSH Lab Results  Component Value Date   HGBA1C 6.0 (H) 08/12/2017   Lab Results  Component Value Date   CHOL 99 (L) 07/30/2016   HDL 42 07/30/2016   LDLCALC 26 07/30/2016   TRIG 157 (H) 07/30/2016   CHOLHDL 2.4 07/30/2016    Significant Diagnostic Results in last 30 days:  US Renal  Result Date: 08/25/2017 CLINICAL DATA:  80 year old hypertensive diabetic male with acute kidney insufficiency. Initial encounter. EXAM: RENAL / URINARY TRACT ULTRASOUND COMPLETE COMPARISON:  08/11/2012 CT. FINDINGS: Right  Kidney: Length: 12 cm. Renal parenchymal thinning. No hydronephrosis or mass. Left Kidney: Length: 11.6 cm. Renal parenchymal thinning. No hydronephrosis or mass. Bladder: Appears normal for degree of bladder distention. IMPRESSION: Bilateral renal parenchymal thinning. No hydronephrosis. Electronically Signed   By: Genia Del M.D.   On: 08/25/2017 07:01   Dg C-arm 1-60 Min  Result Date: 08/23/2017 CLINICAL DATA:  Status post ORIF of proximal femur fracture. EXAM: DG C-ARM 61-120 MIN; RIGHT FEMUR 2 VIEWS COMPARISON:  Earlier today FINDINGS: The patient has undergone open reduction and internal fixation of the proximal right femur fracture. An intramedullary rod and hip screw has been placed. The hardware components and fracture fragments appear to be in near anatomic alignment. IMPRESSION: 1. Status post ORIF of proximal right femur fracture. Electronically Signed   By: Kerby Moors M.D.   On: 08/23/2017 16:16   Dg Femur, Min 2 Views Right  Result Date: 08/23/2017 CLINICAL DATA:  Status post ORIF of proximal femur fracture. EXAM: DG C-ARM 61-120 MIN; RIGHT FEMUR 2 VIEWS COMPARISON:  Earlier today FINDINGS: The patient has undergone open reduction and internal fixation of the proximal right femur fracture. An intramedullary rod and hip screw has been placed. The hardware components and fracture fragments appear to be in near anatomic alignment. IMPRESSION: 1. Status post ORIF of proximal right femur fracture. Electronically Signed   By: Kerby Moors M.D.   On: 08/23/2017 16:16   Dg Femur Port, Min 2 Views Right  Result Date: 08/23/2017 CLINICAL DATA:  Right IM nail EXAM: RIGHT FEMUR PORTABLE 2 VIEW COMPARISON:  08/23/2017 FINDINGS: Intramedullary rod and hip screw reduces the obliquely oriented fracture involving the proximal diaphysis of the right femur. The hardware components and fracture fragments are in anatomic alignment. IMPRESSION: 1. Status post ORIF of right femur fracture. Electronically  Signed   By: Kerby Moors M.D.   On: 08/23/2017 19:17    Assessment/Plan  #1 rash on the thighs-this possibly may be contact dermatitis.--Will treat with triamcinolone cream and monitor-this was discussed and evaluated with the wound care nurse  Who  will follow up  .  #2 pneumonia-she appears to have made a fairly unremarkable recovery from this has completed antibiotic he does have nebulizers as needed is not complaining of cough or shortness of breath today.  #3 history of hypotension again blood pressure today manually was reassuring at this point will monitor Lopressor was recently decreased he is no longer on an ACE inhibitor-if hypotensive readings become persistent consider reducing Lopressor although again concern try to keep his pulse rate incheck- was in the 80s on exam today  #4 insomnia as noted above will start melatonin and monitor.  #5 diabetes type 2 CBGs are largely in the 100s he is on Glucophage hemoglobin A1c was 6 back in September.  #6 history of renal insufficiency creatinine of 1.21 BUN of 40 on most recent lab appears relatively baseline appears creatinine has slowly crept up although in hospital was significantly higher at 1.9 to-we will update this to ensure stability make sure it is not further trending up  #7-history of mildly elevated bilirubin of 1.5 on recent lab will update this as well.  #8 history of anemia hemoglobin has stabilized at 9.4 this is up from 7.4 at one point I suspect status post surgery will monitor periodically.  #9 history of right femur fracture status post repair he is on aspirin for DVT prophylaxis for 4 weeks continues on when necessary Norco for pain and Robaxin for muscle spasms apparently this is been stable although he still at times complains of feeling weak will monitor he is currently working with therapy and will need orthopedic follow-up.  . #10 history of neuropathy most likely diabetic related he is on Neurontin daily at  bedtime at nigh--says he is already on Neurontin and receives Norco as needed for pain will be conservative  in pursuing aggressive insomnia medications -- we'll start with the melatonin hopefully can get beneficial effects without using something with more side effects   CPT--99310-of note greater than 35 minutes spent assessing patient-reviewing his chart review his labs and coordinating and formulating plan of care for numerous diagnoses-note greater than 50% of time spent coordinating plan of care

## 2017-09-23 ENCOUNTER — Encounter (HOSPITAL_COMMUNITY)
Admission: RE | Admit: 2017-09-23 | Discharge: 2017-09-23 | Disposition: A | Discharge status: Home / Self Care | Payer: Medicare HMO | Source: Skilled Nursing Facility | Attending: Neurology | Admitting: Neurology

## 2017-09-23 LAB — COMPREHENSIVE METABOLIC PANEL
ALBUMIN: 3.3 g/dL — AB (ref 3.5–5.0)
ALK PHOS: 149 U/L — AB (ref 38–126)
ALT: 10 U/L — AB (ref 17–63)
AST: 24 U/L (ref 15–41)
Anion gap: 8 (ref 5–15)
BUN: 31 mg/dL — ABNORMAL HIGH (ref 6–20)
CALCIUM: 9.2 mg/dL (ref 8.9–10.3)
CHLORIDE: 97 mmol/L — AB (ref 101–111)
CO2: 28 mmol/L (ref 22–32)
CREATININE: 1.1 mg/dL (ref 0.61–1.24)
GFR calc non Af Amer: >60 mL/min (ref >60)
GLUCOSE: 184 mg/dL — AB (ref 65–99)
Potassium: 4.6 mmol/L (ref 3.5–5.1)
SODIUM: 133 mmol/L — AB (ref 135–145)
Total Bilirubin: 0.9 mg/dL (ref 0.3–1.2)
Total Protein: 6.7 g/dL (ref 6.5–8.1)

## 2017-09-30 ENCOUNTER — Other Ambulatory Visit: Payer: Self-pay

## 2017-09-30 ENCOUNTER — Encounter (HOSPITAL_COMMUNITY)
Admission: RE | Admit: 2017-09-30 | Discharge: 2017-09-30 | Disposition: A | Discharge status: Home / Self Care | Payer: Medicare HMO | Source: Skilled Nursing Facility | Attending: Internal Medicine | Admitting: Internal Medicine

## 2017-09-30 DIAGNOSIS — E119 Type 2 diabetes mellitus without complications: Secondary | ICD-10-CM | POA: Insufficient documentation

## 2017-09-30 DIAGNOSIS — J189 Pneumonia, unspecified organism: Secondary | ICD-10-CM | POA: Insufficient documentation

## 2017-09-30 DIAGNOSIS — S72141D Displaced intertrochanteric fracture of right femur, subsequent encounter for closed fracture with routine healing: Secondary | ICD-10-CM | POA: Insufficient documentation

## 2017-09-30 DIAGNOSIS — Z4789 Encounter for other orthopedic aftercare: Secondary | ICD-10-CM | POA: Insufficient documentation

## 2017-09-30 LAB — COMPREHENSIVE METABOLIC PANEL
ALT: 12 U/L — AB (ref 17–63)
AST: 23 U/L (ref 15–41)
Albumin: 3.4 g/dL — ABNORMAL LOW (ref 3.5–5.0)
Alkaline Phosphatase: 141 U/L — ABNORMAL HIGH (ref 38–126)
Anion gap: 9 (ref 5–15)
BUN: 34 mg/dL — AB (ref 6–20)
CHLORIDE: 100 mmol/L — AB (ref 101–111)
CO2: 29 mmol/L (ref 22–32)
CREATININE: 0.84 mg/dL (ref 0.61–1.24)
Calcium: 9.2 mg/dL (ref 8.9–10.3)
GLUCOSE: 171 mg/dL — AB (ref 65–99)
Potassium: 4.5 mmol/L (ref 3.5–5.1)
Sodium: 138 mmol/L (ref 135–145)
Total Bilirubin: 0.7 mg/dL (ref 0.3–1.2)
Total Protein: 7 g/dL (ref 6.5–8.1)

## 2017-09-30 MED ORDER — HYDROCODONE-ACETAMINOPHEN 5-325 MG PO TABS: 1.0000 | ORAL_TABLET | Freq: Four times a day (QID) | ORAL | 0 refills | Status: DC | PRN

## 2017-09-30 NOTE — Telephone Encounter (Signed)
RX Fax for [facility]@ [phone]  

## 2017-10-03 DIAGNOSIS — S7221XD Displaced subtrochanteric fracture of right femur, subsequent encounter for closed fracture with routine healing: Secondary | ICD-10-CM | POA: Diagnosis not present

## 2017-10-08 ENCOUNTER — Non-Acute Institutional Stay (SKILLED_NURSING_FACILITY): Payer: Medicare HMO | Admitting: Internal Medicine

## 2017-10-08 ENCOUNTER — Encounter: Payer: Self-pay | Admitting: Internal Medicine

## 2017-10-08 DIAGNOSIS — E782 Mixed hyperlipidemia: Secondary | ICD-10-CM

## 2017-10-08 DIAGNOSIS — Z9889 Other specified postprocedural states: Secondary | ICD-10-CM

## 2017-10-08 DIAGNOSIS — E118 Type 2 diabetes mellitus with unspecified complications: Secondary | ICD-10-CM

## 2017-10-08 DIAGNOSIS — J449 Chronic obstructive pulmonary disease, unspecified: Secondary | ICD-10-CM | POA: Diagnosis not present

## 2017-10-08 DIAGNOSIS — Z967 Presence of other bone and tendon implants: Secondary | ICD-10-CM | POA: Diagnosis not present

## 2017-10-08 DIAGNOSIS — D62 Acute posthemorrhagic anemia: Secondary | ICD-10-CM | POA: Diagnosis not present

## 2017-10-08 DIAGNOSIS — I10 Essential (primary) hypertension: Secondary | ICD-10-CM

## 2017-10-08 DIAGNOSIS — Z8781 Personal history of (healed) traumatic fracture: Secondary | ICD-10-CM

## 2017-10-08 NOTE — Progress Notes (Signed)
-Chief complaint  This is a routine visit  Level of care is skilled  Facility is [facility]   Chief complaint medical management of chronic medical conditions including hip fracture status post repair pneumonia-hypertension-diabetes type 2-anemia- vitamin D deficiency- insomnia- neuropathy   History of present illness.  Patient is a pleasant 81 year old male above diagnoses-is working with rehab status post right hip fracture with repair apparently is making progress.  He is on Vicodin as needed for pain management he is also on Neurontin at night with a history of peripheral neuropathy.  As apparently there is some history of narcotic dependence in the past and family would like to discontinue the narcotic if possible-I discussed this with him and he is agreeable to that.  It is thought pain issues may be more neuropathy related and will start in a.m. dose of Neurontin.  Continues on aspirin for DVT prophylaxis status post surgery-he also has an order for Robaxin as needed  He recently completed a course of Levaquin for pneumonia-this appears to have resolved unremarkably he has nebulizers as needed he is not complaining of any increased shortness of breath or cough this evening  he also was treated recently for a thigh rash which apparently has resolved at one point hypotension was a concern--s ACE inhibitor was discontinued in the hospital--his Lopressor was reduced in facility by Dr. Lyndel Safe --I got a blood pressure of 144/72 this evening   previous listed 135/56-102/55-dramatic of any hypertension denies any dizziness pulses appear to range from the 50s-to the 80s--ones in the 50s appear to be infrequent pulse was 72 on exam tonight.  He also recently complained of insomnia and does have an order for as needed melatonin-apparently this is helping he said he slept well last night.  He also has a history of type 2 diabetes on Glucophage--blood sugars have been largely running in the  100s--with occasional spikes over 200 assessment and plan.  H hemoglobin A1c was 6 on lab done in September   regards to anemia this appears to be postop hemoglobin is rising was 9.4 labs done a month ago will update this  So apparently during his hospitalization oropharynx mass discovered incidentally on CT scan this will need follow-up by ENT after discharge  Past Medical History:  Diagnosis Date  . Arthritis   . Constipation   . COPD (chronic obstructive pulmonary disease) ([place])   . Coronary artery disease   . Diabetes mellitus   . Hypertension   . Skin cancer of face         Past Surgical History:  Procedure Laterality Date  . APPENDECTOMY  1958  . BACK SURGERY    . CORONARY STENT PLACEMENT    . FEMUR IM NAIL Right 08/23/2017   Procedure: INTRAMEDULLARY (IM) NAIL FEMORAL;  Surgeon: Nicholes Stairs, MD;  Location: [place];  Service: Orthopedics;  Laterality: Right;  . SKIN CANCER EXCISION  07/2017    No Known Allergies      Outpatient Encounter Prescripti 8  Medication Sig  . aspirin EC 81 MG tablet Take 1 tablet (81 mg total) by mouth 2 (two) times daily.  Roseanne Kaufman Peru-Castor Oil (VENELEX) OINT Apply to sacrum and bilateral buttocks q shift and prn erythema. Every shift  . cholecalciferol (VITAMIN D) 1000 units tablet Take 5,000 Units by mouth daily.  Marland Kitchen gabapentin (NEURONTIN) 300 MG capsule Take 1 capsule (300 mg total) by mouth at bedtime.  Marland Kitchen HYDROcodone-acetaminophen (NORCO) 5-325 MG tablet Take 1-2 tablets by  mouth every 6 (six) hours as needed for moderate pain.  Marland Kitchen ipratropium-albuterol (DUONEB) 0.5-2.5 (3) MG/3ML SOLN Take 3 mLs by nebulization every 6 (six) hours as needed.  . magnesium gluconate (MAGONATE) 500 MG tablet Take 500 mg by mouth daily.  . metFORMIN (GLUCOPHAGE) 1000 MG tablet Take 1 tablet (1,000 mg total) by mouth 2 (two) times daily with a meal.  . methocarbamol (ROBAXIN) 500 MG tablet Take 1 tablet (500 mg total) by mouth  every 6 (six) hours as needed for muscle spasms.  . Metoprolol Tartrate 75 MG TABS Take 75 mg by mouth 2 (two) times daily.  . Misc Natural Products (OSTEO BI-FLEX ADV TRIPLE ST PO) Take 1 tablet by mouth once a day  . Multiple Vitamin (MULTIVITAMIN WITH MINERALS) TABS tablet Take 1 tablet by mouth daily.  . ondansetron (ZOFRAN) 4 MG tablet Take 4 mg by mouth every 6 (six) hours as needed for nausea or vomiting.  Marland Kitchen Phenyleph-CPM-DM-Aspirin (ALKA-SELTZER PLUS COLD & COUGH PO) Take 2 tablets by mouth daily as needed (nasal congestion).  . polyethylene glycol (MIRALAX / GLYCOLAX) packet Take 17 g by mouth daily.  . simvastatin (ZOCOR) 40 MG tablet Take 40 mg by mouth daily.  . tamsulosin (FLOMAX) 0.4 MG CAPS capsule Take 0.4 mg by mouth.  . trolamine salicylate (ASPERCREME) 10 % cream Apply 1 application topically as needed for muscle pain.  . [DISCONTINUED] guaiFENesin (MUCINEX) 600 MG 12 hr tablet Take 600 mg by mouth 2 (two) times daily.  . [DISCONTINUED] levofloxacin (LEVAQUIN) 500 MG tablet Take 500 mg by mouth daily.  . [DISCONTINUED] metoprolol (LOPRESSOR) 50 MG tablet Take 100 mg by mouth 2 (two) times daily.   . [DISCONTINUED] potassium chloride SA (K-DUR,KLOR-CON) 20 MEQ tablet Take 1 tablet (20 mEq total) by mouth daily.     Melatonin 3 mg nightly as needed insomnia  Review of Systems   In general is not complaining of fever chills --has gained a small amount of weight he says he is now nearing his baseline of 173 pounds  Skin does not complain of diaphoresis thigh rash has resolved  Head ears eyes nose mouth and throat does not complainiof sore throat or visual changes.  Respiratory is not complaining of shortness breath or cough --recently treated for pneumonia  Cardiac does not complaining of chest pain   Or any  increased lower extremity edema--  GI does not complain of abdominal discomfort nausea vomiting diarrhea constipation appetite appears to be improving he has  gained some weight.  Musculoskeletal I At this point not complaining of joint pain it is thought possibly any residual pain is more neuropathy related  Neurologic -- does not complaining of syncope dizziness at this time or headache--- has a history of neuropathy.  Psych  Insomnia apparently has improved---does not complain of depression or anxiety  Immunization History  Administered Date(s) Administered  . Influenza-Unspecified 08/11/2017       Pertinent  Health Maintenance Due  Topic Date Due  . FOOT EXAM  10/03/2017 (Originally 10/19/1946)  . OPHTHALMOLOGY EXAM  10/03/2017 (Originally 10/19/1946)  . URINE MICROALBUMIN  10/03/2017 (Originally 10/19/1946)  . PNA vac Low Risk Adult (1 of 2 - PCV13) 10/03/2017 (Originally 10/20/2003)  . HEMOGLOBIN A1C  02/09/2018  . INFLUENZA VACCINE  Completed   Fall Risk  02/13/2017 05/06/2016 02/02/2016 10/24/2015  Falls in the past year? No No No No   Functional Status Survey:   He is afebrile pulse of 72 respirations of 18 blood pressure  taken manually 144 2 weight 173.2  Physical Exam In general this is a  well-nourished elderly male in no distress sitting comfortably in his wheelchair.  His skin is warm and dry  Has solar induced changes most prominently on his face is followed by dermatology  Eyes sclerae and conjunctivae are clear visual acuity appears grossly intact.  Oropharynx is clear mucous membranes moist.  Chest is clear to auscultation there is no labored breathing.  Heart is regular rate and rhythm without murmur gallop he does not have significant lower extremity edema  Abdomen somewhat obese soft nontender with positive bowel sounds.  Musculoskeletal is able to move all extremities 4 currently ambulating in a wheelchair--  Neurologic is grossly intact    Speech is clear could not appreciate any lateralizing findings .  Psych is  alert and oriented pleasant and appropriate   Labs  reviewed: September 30, 2017.  Sodium 138 potassium 4.5 BUN 34 creatinine 0.84-albumin 3.4-ALT 12 alkaline phosphatase 141     Recent Labs  08/28/17 0615 09/01/17 0600 09/09/17 0902  NA 138 137 136  K 3.9 4.8 4.9  CL 103 101 101  CO2 29 27 23   GLUCOSE 224* 197* 125*  BUN 43* 32* 40*  CREATININE 1.08 1.07 1.21  CALCIUM 8.6* 9.0 9.2      RecentLabs(withinlast365days)   Recent Labs  08/23/17 0553 08/27/17 0702 09/01/17 0600  AST 20 26 18   ALT 13* 14* 16*  ALKPHOS 67 61 77  BILITOT 0.9 1.2 1.5*  PROT 6.0* 6.0* 6.7  ALBUMIN 3.2* 2.9* 3.1*      RecentLabs(withinlast365days)   Recent Labs  08/22/17 0459  08/28/17 0615 09/01/17 0600 09/09/17 0902  WBC 11.0*  < > 9.0 12.9* 7.9  NEUTROABS 8.2*  --  5.9  --   --   HGB 13.7  < > 8.3* 8.6* 9.4*  HCT 41.0  < > 24.8* 26.6* 29.7*  MCV 92.6  < > 91.5 94.7 99.0  PLT 146*  < > 161 243 250  < > = values in this interval not displayed.   RecentLabs  No results found for: TSH   RecentLabs       Lab Results  Component Value Date   HGBA1C 6.0 (H) 08/12/2017     RecentLabs       Lab Results  Component Value Date   CHOL 99 (L) 07/30/2016   HDL 42 07/30/2016   LDLCALC 26 07/30/2016   TRIG 157 (H) 07/30/2016   CHOLHDL 2.4 07/30/2016      Significant Diagnostic Results in last 30 days:   ImagingResults  US Renal  Result Date: 08/25/2017 CLINICAL DATA:  81 year old hypertensive diabetic male with acute kidney insufficiency. Initial encounter. EXAM: RENAL / URINARY TRACT ULTRASOUND COMPLETE COMPARISON:  08/11/2012 CT. FINDINGS: Right Kidney: Length: 12 cm. Renal parenchymal thinning. No hydronephrosis or mass. Left Kidney: Length: 11.6 cm. Renal parenchymal thinning. No hydronephrosis or mass. Bladder: Appears normal for degree of bladder distention. IMPRESSION: Bilateral renal parenchymal thinning. No hydronephrosis. Electronically Signed   By: Genia Del M.D.   On: 08/25/2017  07:01   Dg C-arm 1-60 Min  Result Date: 08/23/2017 CLINICAL DATA:  Status post ORIF of proximal femur fracture. EXAM: DG C-ARM 61-120 MIN; RIGHT FEMUR 2 VIEWS COMPARISON:  Earlier today FINDINGS: The patient has undergone open reduction and internal fixation of the proximal right femur fracture. An intramedullary rod and hip screw has been placed. The hardware components and fracture fragments appear to be in  near anatomic alignment. IMPRESSION: 1. Status post ORIF of proximal right femur fracture. Electronically Signed   By: Kerby Moors M.D.   On: 08/23/2017 16:16   Dg Femur, Min 2 Views Right  Result Date: 08/23/2017 CLINICAL DATA:  Status post ORIF of proximal femur fracture. EXAM: DG C-ARM 61-120 MIN; RIGHT FEMUR 2 VIEWS COMPARISON:  Earlier today FINDINGS: The patient has undergone open reduction and internal fixation of the proximal right femur fracture. An intramedullary rod and hip screw has been placed. The hardware components and fracture fragments appear to be in near anatomic alignment. IMPRESSION: 1. Status post ORIF of proximal right femur fracture. Electronically Signed   By: Kerby Moors M.D.   On: 08/23/2017 16:16   Dg Femur Port, Min 2 Views Right  Result Date: 08/23/2017 CLINICAL DATA:  Right IM nail EXAM: RIGHT FEMUR PORTABLE 2 VIEW COMPARISON:  08/23/2017 FINDINGS: Intramedullary rod and hip screw reduces the obliquely oriented fracture involving the proximal diaphysis of the right femur. The hardware components and fracture fragments are in anatomic alignment. IMPRESSION: 1. Status post ORIF of right femur fracture. Electronically Signed   By: Kerby Moors M.D.   On: 08/23/2017 19:17    Assessment and plan  #1 history of right hip fracture status post repair he appears to be doing relatively well with his rehab- he is on aspirin for DVT prophylaxis  In regards to pain is thought this is probably more neuropathy session with nursing-does have a history apparently  of narcotic dependence-we will discontinue his hydrocodone and add an extra dose of Neurontin in the a.m. 200 mg in addition to the 300 mg nightly.  2- recent history of pneumonia previous history of COPD this appears to have stable   status post Levaquin he has PRN duo nebs if needed.  3 hypertension-blood pressures appear to be reasonably stable ranging from 144/72--down to 102/55 recently- do not see readings below 638 systolically- at this point continue Lopressor at current dose- occasionally he has pulses in the 50s but is asymptomatic and this is not frequent-pulse was in the 70s this evening.  4.  History of diabetes type 2 on Glucophage CBGs as noted above largely in the 100s occasionally above 200-hemoglobin A1c of 6 in September is satisfactory.  5.-  History of anemia globin is trending up was 9.4 last month will update this aspect there was an element of postop blood loss.  6-neuropathy as noted above will increase his Neurontin dose to twice daily and monitor.  7.  History of vitamin D deficiency he is on supplementation will update a vitamin D level as well.  8 hyperlipidemia he is on simvastatin--per review of previous labs most recent I can see if September 2017 LDL was 26--will update this.  9.  History of renal insufficiency this has improved with a creatinine of 0.84 BUN of 34 on labs done September 30, 2017.  10.-History of insomnia again this appears to have responded to the melatonin apparently he does not use this every night.  11.-History of low magnesium he is on supplementation will update a magnesium level as well  #12-history of oropharynx mass this will need follow-up by ENT after discharge   [id number]-of note greater than 40 minutes spent assessing patient- reviewing his chart- reviewing his labs-discussing his status with nursing as well as with Dr.Gupta via phone--and coordinating and formulating a plan of care for numerous diagnoses-of note greater than 50% of  time spent coordinating plan of care with input  as noted above

## 2017-10-09 ENCOUNTER — Encounter (HOSPITAL_COMMUNITY)
Admission: RE | Admit: 2017-10-09 | Discharge: 2017-10-09 | Disposition: A | Discharge status: Home / Self Care | Payer: Medicare HMO | Source: Skilled Nursing Facility | Attending: *Deleted | Admitting: *Deleted

## 2017-10-09 LAB — CBC WITH DIFFERENTIAL/PLATELET
BASOS ABS: 0 10*3/uL (ref 0.0–0.1)
BASOS PCT: 0 %
EOS PCT: 5 %
Eosinophils Absolute: 0.3 10*3/uL (ref 0.0–0.7)
HEMATOCRIT: 33.4 % — AB (ref 39.0–52.0)
Hemoglobin: 10.9 g/dL — ABNORMAL LOW (ref 13.0–17.0)
LYMPHS PCT: 22 %
Lymphs Abs: 1.2 10*3/uL (ref 0.7–4.0)
MCH: 31.6 pg (ref 26.0–34.0)
MCHC: 32.6 g/dL (ref 30.0–36.0)
MCV: 96.8 fL (ref 78.0–100.0)
Monocytes Absolute: 0.6 10*3/uL (ref 0.1–1.0)
Monocytes Relative: 10 %
NEUTROS ABS: 3.4 10*3/uL (ref 1.7–7.7)
Neutrophils Relative %: 63 %
PLATELETS: 160 10*3/uL (ref 150–400)
RBC: 3.45 MIL/uL — ABNORMAL LOW (ref 4.22–5.81)
RDW: 15.4 % (ref 11.5–15.5)
WBC: 5.5 10*3/uL (ref 4.0–10.5)

## 2017-10-09 LAB — LIPID PANEL
Cholesterol: 110 mg/dL (ref 0–200)
HDL: 48 mg/dL (ref >40)
LDL CALC: 41 mg/dL (ref 0–99)
TRIGLYCERIDES: 107 mg/dL (ref <150)
Total CHOL/HDL Ratio: 2.3 RATIO
VLDL: 21 mg/dL (ref 0–40)

## 2017-10-09 LAB — MAGNESIUM: MAGNESIUM: 1.7 mg/dL (ref 1.7–2.4)

## 2017-10-10 LAB — VITAMIN D 25 HYDROXY (VIT D DEFICIENCY, FRACTURES): VIT D 25 HYDROXY: 46.5 ng/mL (ref 30.0–100.0)

## 2017-10-14 ENCOUNTER — Other Ambulatory Visit (HOSPITAL_COMMUNITY)
Admission: AD | Admit: 2017-10-14 | Discharge: 2017-10-14 | Disposition: A | Discharge status: Home / Self Care | Payer: Medicare HMO | Source: Skilled Nursing Facility | Attending: Internal Medicine | Admitting: Internal Medicine

## 2017-10-14 ENCOUNTER — Encounter: Payer: Self-pay | Admitting: Internal Medicine

## 2017-10-14 ENCOUNTER — Non-Acute Institutional Stay (SKILLED_NURSING_FACILITY): Payer: Medicare HMO | Admitting: Internal Medicine

## 2017-10-14 DIAGNOSIS — R221 Localized swelling, mass and lump, neck: Secondary | ICD-10-CM

## 2017-10-14 DIAGNOSIS — Z967 Presence of other bone and tendon implants: Secondary | ICD-10-CM

## 2017-10-14 DIAGNOSIS — Z8781 Personal history of (healed) traumatic fracture: Secondary | ICD-10-CM | POA: Diagnosis not present

## 2017-10-14 DIAGNOSIS — D72829 Elevated white blood cell count, unspecified: Secondary | ICD-10-CM | POA: Diagnosis not present

## 2017-10-14 DIAGNOSIS — D62 Acute posthemorrhagic anemia: Secondary | ICD-10-CM | POA: Diagnosis not present

## 2017-10-14 DIAGNOSIS — E118 Type 2 diabetes mellitus with unspecified complications: Secondary | ICD-10-CM | POA: Diagnosis not present

## 2017-10-14 DIAGNOSIS — J392 Other diseases of pharynx: Secondary | ICD-10-CM

## 2017-10-14 DIAGNOSIS — J449 Chronic obstructive pulmonary disease, unspecified: Secondary | ICD-10-CM | POA: Diagnosis not present

## 2017-10-14 DIAGNOSIS — I10 Essential (primary) hypertension: Secondary | ICD-10-CM

## 2017-10-14 DIAGNOSIS — Z9889 Other specified postprocedural states: Secondary | ICD-10-CM

## 2017-10-14 LAB — URINALYSIS, ROUTINE W REFLEX MICROSCOPIC
Bilirubin Urine: NEGATIVE
GLUCOSE, UA: 50 mg/dL — AB
HGB URINE DIPSTICK: NEGATIVE
Ketones, ur: NEGATIVE mg/dL
Leukocytes, UA: NEGATIVE
NITRITE: NEGATIVE
PH: 5 (ref 5.0–8.0)
Protein, ur: 100 mg/dL — AB
Specific Gravity, Urine: 1.02 (ref 1.005–1.030)

## 2017-10-14 NOTE — Progress Notes (Signed)
Location:   [facility] Room Number: 155/P Place of Service:  SNF (31) Provider:  Quintella Baton, MD  Patient Care Team: Sinda Du, MD as PCP - General (Internal Medicine)  Extended Emergency Contact Information Primary Emergency Contact: Gish,Chyrl Address: [address]          [place], [place] [address] [place] of [place] Phone: [phone] Mobile Phone: [phone] Relation: Spouse  Code Status:  Full Code Goals of care: Advanced Directive information Advanced Directives 10/14/2017  Does Patient Have a Medical Advance Directive? Yes  Type of Advance Directive (No Data)  Does patient want to make changes to medical advance directive? No - Patient declined  Would patient like information on creating a medical advance directive? No - Patient declined     Chief Complaint  Patient presents with  . Acute Visit    Patient c/o incision redness and Dysuria and Increased Frequency    HPI:  Pt is a 81 y.o. male seen today for an acute visit for some bleeding from his Incision site. He also is c/o incrased frequency ecpecially at noght.  Patient has h/o  Diabetes Type 2 , CAD S/P Stent placement, CHF, Neuropathy, Hypertension, Hyperlipidemia, COPD with Recently Quit smoking. He was admitted to the Facility for therapy after sustaining Right Femoral Fracture requiring intramedullary nail by Ortho on 9/29  His wife was concerned today as they had noticed some bleeding from his incision site and wanted [place] to see it. Patient denies any pain or redness in that area. He also was c/o Increased urination in general but more at night. He denies any dysuria. Any fever or chills. His BS have been running higher then 200 with 1 more then 300 this morning. He says he is eating well and not restricting his diet.   Past Medical History:  Diagnosis Date  . Arthritis   . Constipation   . COPD (chronic obstructive pulmonary disease) ([place])    . Coronary artery disease   . Diabetes mellitus   . Hypertension   . Skin cancer of face    Past Surgical History:  Procedure Laterality Date  . APPENDECTOMY  1958  . BACK SURGERY    . CORONARY STENT PLACEMENT    . FEMUR IM NAIL Right 08/23/2017   Procedure: INTRAMEDULLARY (IM) NAIL FEMORAL;  Surgeon: Nicholes Stairs, MD;  Location: [facility];  Service: Orthopedics;  Laterality: Right;  . SKIN CANCER EXCISION  07/2017    No Known Allergies  Outpatient Encounter Medications as of 10/14/2017  Medication Sig  . aspirin EC 81 MG tablet Take 81 mg by mouth 2 (two) times daily.  Roseanne Kaufman Peru-Castor Oil (VENELEX) OINT Apply to sacrum and bilateral buttocks q shift and prn erythema. Every shift  . cholecalciferol (VITAMIN D) 1000 units tablet Take 5,000 Units by mouth daily.  Marland Kitchen gabapentin (NEURONTIN) 100 MG capsule Take 200 mg every morning by mouth.  . gabapentin (NEURONTIN) 300 MG capsule Take 1 capsule (300 mg total) by mouth at bedtime.  Marland Kitchen ipratropium-albuterol (DUONEB) 0.5-2.5 (3) MG/3ML SOLN Take 3 mLs by nebulization every 6 (six) hours as needed.  . magnesium gluconate (MAGONATE) 500 MG tablet Take 500 mg by mouth daily.  . Melatonin 3 MG TABS Take 3 mg by mouth at bedtime.  . metFORMIN (GLUCOPHAGE) 1000 MG tablet Take 1 tablet (1,000 mg total) by mouth 2 (two) times daily with a meal.  . methocarbamol (ROBAXIN) 500 MG tablet Take 1  tablet (500 mg total) by mouth every 6 (six) hours as needed for muscle spasms.  . Metoprolol Tartrate 75 MG TABS Take 75 mg by mouth 2 (two) times daily.  . Misc Natural Products (OSTEO BI-FLEX ADV TRIPLE ST PO) Take 1 tablet by mouth once a day  . Multiple Vitamin (MULTIVITAMIN WITH MINERALS) TABS tablet Take 1 tablet by mouth daily.  . ondansetron (ZOFRAN) 4 MG tablet Take 4 mg by mouth every 6 (six) hours as needed for nausea or vomiting.  Marland Kitchen Phenyleph-CPM-DM-Aspirin (ALKA-SELTZER PLUS COLD & COUGH PO) Take 2 tablets by mouth daily as needed (nasal  congestion).  . polyethylene glycol (MIRALAX / GLYCOLAX) packet Take 17 g by mouth daily.  . simvastatin (ZOCOR) 40 MG tablet Take 40 mg by mouth daily.  . tamsulosin (FLOMAX) 0.4 MG CAPS capsule Take 0.4 mg by mouth.  . trolamine salicylate (ASPERCREME) 10 % cream Apply 1 application topically as needed for muscle pain.  . [DISCONTINUED] HYDROcodone-acetaminophen (NORCO) 5-325 MG tablet Take 1-2 tablets every 6 (six) hours as needed by mouth for moderate pain. (Patient not taking: Reported on 10/09/2017)   No facility-administered encounter medications on file as of 10/14/2017.      Review of Systems  Constitutional: Negative.   HENT: Negative.   Respiratory: Positive for cough.   Cardiovascular: Negative.   Gastrointestinal: Negative.   Genitourinary: Positive for frequency. Negative for dysuria.  Musculoskeletal: Negative.   Skin: Positive for wound.  Neurological: Negative.   Psychiatric/Behavioral: Negative.   All other systems reviewed and are negative.   Immunization History  Administered Date(s) Administered  . Influenza-Unspecified 08/11/2017  . Pneumococcal Conjugate-13 10/06/2017  . Tdap 10/06/2017   Pertinent  Health Maintenance Due  Topic Date Due  . FOOT EXAM  10/25/2017 (Originally 10/19/1946)  . OPHTHALMOLOGY EXAM  10/25/2017 (Originally 10/19/1946)  . URINE MICROALBUMIN  10/25/2017 (Originally 10/19/1946)  . HEMOGLOBIN A1C  02/09/2018  . PNA vac Low Risk Adult (2 of 2 - PPSV23) 10/06/2018  . INFLUENZA VACCINE  Completed   Fall Risk  02/13/2017 05/06/2016 02/02/2016 10/24/2015  Falls in the past year? No No No No   Functional Status Survey:    Vitals:   10/14/17 1226  BP: (!) 106/56  Pulse: 76  Resp: 20  Temp: 98.4 F (36.9 C)  SpO2: 95%   There is no height or weight on file to calculate BMI. Physical Exam  Constitutional: He is oriented to person, place, and time. He appears well-developed and well-nourished.  HENT:  Head: Normocephalic.    Mouth/Throat: Oropharynx is clear and moist.  Eyes: Pupils are equal, round, and reactive to light.  Neck: Neck supple.  Cardiovascular: Normal rate and regular rhythm.  Pulmonary/Chest: Effort normal and breath sounds normal. No respiratory distress. He has no wheezes. He has no rales.  Abdominal: Soft. Bowel sounds are normal. He exhibits no distension. There is no tenderness. There is no rebound.  Musculoskeletal: He exhibits no edema.  Lymphadenopathy:    He has no cervical adenopathy.  Neurological: He is alert and oriented to person, place, and time.  Skin:  Had small superficial opening on his Surgical incision . No redness or swelling around it. Not tender.  Psychiatric: He has a normal mood and affect. His speech is normal.    Labs reviewed: Recent Labs    09/09/17 0902 09/23/17 0630 09/30/17 0704 10/09/17 0400  NA 136 133* 138  --   K 4.9 4.6 4.5  --   CL 101 97*  100*  --   CO2 23 28 29   --   GLUCOSE 125* 184* 171*  --   BUN 40* 31* 34*  --   CREATININE 1.21 1.10 0.84  --   CALCIUM 9.2 9.2 9.2  --   MG  --   --   --  1.7   Recent Labs    09/01/17 0600 09/23/17 0630 09/30/17 0704  AST 18 24 23   ALT 16* 10* 12*  ALKPHOS 77 149* 141*  BILITOT 1.5* 0.9 0.7  PROT 6.7 6.7 7.0  ALBUMIN 3.1* 3.3* 3.4*   Recent Labs    08/22/17 0459  08/28/17 0615 09/01/17 0600 09/09/17 0902 10/09/17 0400  WBC 11.0*   < > 9.0 12.9* 7.9 5.5  NEUTROABS 8.2*  --  5.9  --   --  3.4  HGB 13.7   < > 8.3* 8.6* 9.4* 10.9*  HCT 41.0   < > 24.8* 26.6* 29.7* 33.4*  MCV 92.6   < > 91.5 94.7 99.0 96.8  PLT 146*   < > 161 243 250 160   < > = values in this interval not displayed.   No results found for: TSH Lab Results  Component Value Date   HGBA1C 6.0 (H) 08/12/2017   Lab Results  Component Value Date   CHOL 110 10/09/2017   HDL 48 10/09/2017   LDLCALC 41 10/09/2017   TRIG 107 10/09/2017   CHOLHDL 2.3 10/09/2017    Significant Diagnostic Results in last 30 days:  No  results found.  Assessment/Plan  S/P Intramedullary implant right femur D/W the wife that wound looks good and is healing well. He has done very well with therapy. He is off Narcotics and pain seem to be controlled. Will decrease the Aspirin to QD as per Ortho.  Type 2 diabetes mellitus with Neuropathy A1C in 09/18 was 6 Patient has been on Ewa Gentry before which was discontinued due to him loosing weight and well controlled diabetes. Will continue sliding scale for now. This can be reason for his Increase urination. I have d/w him to watch his diet. Will reconsider starting Levimir if BS continues run high. Increased urination Will Check UA and rule out infection Also he wants to take Flomax in the morning Essential hypertension, benign His lisinopril was stopped due to Hypotension and Renal insufficiency. With his Low BP will decrease his Metoprolol to 50 mg BID. Anemia due to acute blood loss. Required transfusion after surgery. On iron supplement Hgb has improved. Recheck Hgb COPD On Bronchodilators. Is not smoking anymore Oro pharanyx mass Was found incidental on CT scan. Needs follow up with ENT at discharge. Smoking Patein says he has quit smoking now. Disposition Eventually Wants to go home with his wife.     Family/ staff Communication:   Labs/tests ordered:  CBC, BMP UA Total time spent in this patient care encounter was 45_ minutes; greater than 50% of the visit spent counseling patient, reviewing records , Labs and coordinating care for problems addressed at this encounter.

## 2017-10-15 ENCOUNTER — Other Ambulatory Visit (HOSPITAL_COMMUNITY)
Admission: RE | Admit: 2017-10-15 | Discharge: 2017-10-15 | Disposition: A | Discharge status: Home / Self Care | Payer: Medicare HMO | Source: Other Acute Inpatient Hospital | Attending: Internal Medicine | Admitting: Internal Medicine

## 2017-10-15 DIAGNOSIS — D72829 Elevated white blood cell count, unspecified: Secondary | ICD-10-CM | POA: Insufficient documentation

## 2017-10-15 LAB — URINALYSIS, ROUTINE W REFLEX MICROSCOPIC
Bacteria, UA: NONE SEEN
Bilirubin Urine: NEGATIVE
HGB URINE DIPSTICK: NEGATIVE
Ketones, ur: NEGATIVE mg/dL
Leukocytes, UA: NEGATIVE
Nitrite: NEGATIVE
PH: 5 (ref 5.0–8.0)
PROTEIN: 100 mg/dL — AB
SPECIFIC GRAVITY, URINE: 1.016 (ref 1.005–1.030)
SQUAMOUS EPITHELIAL / LPF: NONE SEEN

## 2017-10-16 LAB — URINE CULTURE

## 2017-10-17 ENCOUNTER — Inpatient Hospital Stay (HOSPITAL_COMMUNITY): Payer: Medicare HMO

## 2017-10-17 ENCOUNTER — Emergency Department (HOSPITAL_COMMUNITY): Payer: Medicare HMO

## 2017-10-17 ENCOUNTER — Inpatient Hospital Stay (HOSPITAL_COMMUNITY)
Admission: EM | Admit: 2017-10-17 | Discharge: 2017-10-21 | DRG: 193 | Disposition: A | Discharge status: Skilled Nursing Facility | Payer: Medicare HMO | Attending: Pulmonary Disease | Admitting: Pulmonary Disease

## 2017-10-17 ENCOUNTER — Encounter (HOSPITAL_COMMUNITY): Payer: Self-pay | Admitting: Emergency Medicine

## 2017-10-17 ENCOUNTER — Other Ambulatory Visit: Payer: Self-pay

## 2017-10-17 DIAGNOSIS — J181 Lobar pneumonia, unspecified organism: Principal | ICD-10-CM | POA: Diagnosis present

## 2017-10-17 DIAGNOSIS — E119 Type 2 diabetes mellitus without complications: Secondary | ICD-10-CM

## 2017-10-17 DIAGNOSIS — R05 Cough: Secondary | ICD-10-CM | POA: Diagnosis not present

## 2017-10-17 DIAGNOSIS — Z955 Presence of coronary angioplasty implant and graft: Secondary | ICD-10-CM | POA: Diagnosis not present

## 2017-10-17 DIAGNOSIS — Z7951 Long term (current) use of inhaled steroids: Secondary | ICD-10-CM | POA: Diagnosis not present

## 2017-10-17 DIAGNOSIS — I10 Essential (primary) hypertension: Secondary | ICD-10-CM | POA: Diagnosis not present

## 2017-10-17 DIAGNOSIS — N179 Acute kidney failure, unspecified: Secondary | ICD-10-CM | POA: Diagnosis not present

## 2017-10-17 DIAGNOSIS — F17210 Nicotine dependence, cigarettes, uncomplicated: Secondary | ICD-10-CM | POA: Diagnosis present

## 2017-10-17 DIAGNOSIS — Z9889 Other specified postprocedural states: Secondary | ICD-10-CM

## 2017-10-17 DIAGNOSIS — J189 Pneumonia, unspecified organism: Secondary | ICD-10-CM | POA: Diagnosis present

## 2017-10-17 DIAGNOSIS — R262 Difficulty in walking, not elsewhere classified: Secondary | ICD-10-CM | POA: Diagnosis not present

## 2017-10-17 DIAGNOSIS — J9601 Acute respiratory failure with hypoxia: Secondary | ICD-10-CM | POA: Diagnosis present

## 2017-10-17 DIAGNOSIS — Z79899 Other long term (current) drug therapy: Secondary | ICD-10-CM | POA: Diagnosis not present

## 2017-10-17 DIAGNOSIS — J449 Chronic obstructive pulmonary disease, unspecified: Secondary | ICD-10-CM | POA: Diagnosis not present

## 2017-10-17 DIAGNOSIS — E871 Hypo-osmolality and hyponatremia: Secondary | ICD-10-CM | POA: Diagnosis not present

## 2017-10-17 DIAGNOSIS — I509 Heart failure, unspecified: Secondary | ICD-10-CM | POA: Diagnosis not present

## 2017-10-17 DIAGNOSIS — I2510 Atherosclerotic heart disease of native coronary artery without angina pectoris: Secondary | ICD-10-CM | POA: Diagnosis not present

## 2017-10-17 DIAGNOSIS — M47892 Other spondylosis, cervical region: Secondary | ICD-10-CM | POA: Diagnosis not present

## 2017-10-17 DIAGNOSIS — Z8781 Personal history of (healed) traumatic fracture: Secondary | ICD-10-CM

## 2017-10-17 DIAGNOSIS — Z833 Family history of diabetes mellitus: Secondary | ICD-10-CM | POA: Diagnosis not present

## 2017-10-17 DIAGNOSIS — Z85828 Personal history of other malignant neoplasm of skin: Secondary | ICD-10-CM | POA: Diagnosis not present

## 2017-10-17 DIAGNOSIS — M6281 Muscle weakness (generalized): Secondary | ICD-10-CM | POA: Diagnosis not present

## 2017-10-17 DIAGNOSIS — J440 Chronic obstructive pulmonary disease with acute lower respiratory infection: Secondary | ICD-10-CM | POA: Diagnosis present

## 2017-10-17 DIAGNOSIS — Z794 Long term (current) use of insulin: Secondary | ICD-10-CM | POA: Diagnosis not present

## 2017-10-17 DIAGNOSIS — R000 Tachycardia, unspecified: Secondary | ICD-10-CM | POA: Diagnosis not present

## 2017-10-17 DIAGNOSIS — Z7982 Long term (current) use of aspirin: Secondary | ICD-10-CM

## 2017-10-17 DIAGNOSIS — R69 Illness, unspecified: Secondary | ICD-10-CM | POA: Diagnosis not present

## 2017-10-17 DIAGNOSIS — IMO0001 Reserved for inherently not codable concepts without codable children: Secondary | ICD-10-CM | POA: Diagnosis present

## 2017-10-17 DIAGNOSIS — S72141D Displaced intertrochanteric fracture of right femur, subsequent encounter for closed fracture with routine healing: Secondary | ICD-10-CM | POA: Diagnosis not present

## 2017-10-17 LAB — CBC WITH DIFFERENTIAL/PLATELET
Basophils Absolute: 0 10*3/uL (ref 0.0–0.1)
Basophils Relative: 0 %
EOS PCT: 5 %
Eosinophils Absolute: 0.4 10*3/uL (ref 0.0–0.7)
HEMATOCRIT: 34.9 % — AB (ref 39.0–52.0)
Hemoglobin: 11 g/dL — ABNORMAL LOW (ref 13.0–17.0)
LYMPHS ABS: 1.3 10*3/uL (ref 0.7–4.0)
LYMPHS PCT: 16 %
MCH: 30.8 pg (ref 26.0–34.0)
MCHC: 31.5 g/dL (ref 30.0–36.0)
MCV: 97.8 fL (ref 78.0–100.0)
MONO ABS: 0.9 10*3/uL (ref 0.1–1.0)
MONOS PCT: 11 %
NEUTROS ABS: 5.6 10*3/uL (ref 1.7–7.7)
Neutrophils Relative %: 68 %
PLATELETS: 202 10*3/uL (ref 150–400)
RBC: 3.57 MIL/uL — ABNORMAL LOW (ref 4.22–5.81)
RDW: 14.8 % (ref 11.5–15.5)
WBC: 8.2 10*3/uL (ref 4.0–10.5)

## 2017-10-17 LAB — URINE CULTURE: Culture: <10000 — AB

## 2017-10-17 LAB — COMPREHENSIVE METABOLIC PANEL
ALT: 14 U/L — ABNORMAL LOW (ref 17–63)
AST: 21 U/L (ref 15–41)
Albumin: 3 g/dL — ABNORMAL LOW (ref 3.5–5.0)
Alkaline Phosphatase: 100 U/L (ref 38–126)
Anion gap: 9 (ref 5–15)
BILIRUBIN TOTAL: 0.7 mg/dL (ref 0.3–1.2)
BUN: 36 mg/dL — AB (ref 6–20)
CHLORIDE: 97 mmol/L — AB (ref 101–111)
CO2: 27 mmol/L (ref 22–32)
CREATININE: 1.21 mg/dL (ref 0.61–1.24)
Calcium: 9 mg/dL (ref 8.9–10.3)
GFR, EST NON AFRICAN AMERICAN: 56 mL/min — AB (ref >60)
Glucose, Bld: 212 mg/dL — ABNORMAL HIGH (ref 65–99)
POTASSIUM: 4.5 mmol/L (ref 3.5–5.1)
Sodium: 133 mmol/L — ABNORMAL LOW (ref 135–145)
TOTAL PROTEIN: 7.2 g/dL (ref 6.5–8.1)

## 2017-10-17 LAB — TROPONIN I
TROPONIN I: 0.05 ng/mL — AB (ref <0.03)
Troponin I: 0.05 ng/mL (ref <0.03)

## 2017-10-17 LAB — LACTIC ACID, PLASMA
LACTIC ACID, VENOUS: 2.4 mmol/L — AB (ref 0.5–1.9)
LACTIC ACID, VENOUS: 2.5 mmol/L — AB (ref 0.5–1.9)
Lactic Acid, Venous: 1.6 mmol/L (ref 0.5–1.9)

## 2017-10-17 LAB — GLUCOSE, CAPILLARY
GLUCOSE-CAPILLARY: 287 mg/dL — AB (ref 65–99)
Glucose-Capillary: 215 mg/dL — ABNORMAL HIGH (ref 65–99)
Glucose-Capillary: 236 mg/dL — ABNORMAL HIGH (ref 65–99)

## 2017-10-17 LAB — BRAIN NATRIURETIC PEPTIDE: B Natriuretic Peptide: 245 pg/mL — ABNORMAL HIGH (ref 0.0–100.0)

## 2017-10-17 LAB — I-STAT CG4 LACTIC ACID, ED: Lactic Acid, Venous: 1.93 mmol/L — ABNORMAL HIGH (ref 0.5–1.9)

## 2017-10-17 MED ORDER — SODIUM CHLORIDE 0.9 % IV BOLUS (SEPSIS): 1000.0000 mL | Freq: Once | INTRAVENOUS | Status: DC

## 2017-10-17 MED ORDER — TAMSULOSIN HCL 0.4 MG PO CAPS
0.4000 mg | ORAL_CAPSULE | Freq: Every day | ORAL | Status: DC
  Administered 2017-10-17 – 2017-10-21 (×5): 0.4 mg via ORAL
  Filled 2017-10-17 (×5): qty 1

## 2017-10-17 MED ORDER — IPRATROPIUM-ALBUTEROL 0.5-2.5 (3) MG/3ML IN SOLN: 3.0000 mL | Freq: Four times a day (QID) | RESPIRATORY_TRACT | Status: DC | PRN

## 2017-10-17 MED ORDER — TRAZODONE HCL 50 MG PO TABS
50.0000 mg | ORAL_TABLET | Freq: Once | ORAL | Status: AC
  Administered 2017-10-17: 50 mg via ORAL
  Filled 2017-10-17: qty 1

## 2017-10-17 MED ORDER — INSULIN ASPART 100 UNIT/ML ~~LOC~~ SOLN
0.0000 [IU] | Freq: Three times a day (TID) | SUBCUTANEOUS | Status: DC
  Administered 2017-10-17: 5 [IU] via SUBCUTANEOUS
  Administered 2017-10-17: 3 [IU] via SUBCUTANEOUS
  Administered 2017-10-18: 2 [IU] via SUBCUTANEOUS
  Administered 2017-10-18: 3 [IU] via SUBCUTANEOUS
  Administered 2017-10-18: 5 [IU] via SUBCUTANEOUS
  Administered 2017-10-19: 2 [IU] via SUBCUTANEOUS
  Administered 2017-10-19: 3 [IU] via SUBCUTANEOUS
  Administered 2017-10-19 – 2017-10-20 (×2): 5 [IU] via SUBCUTANEOUS
  Administered 2017-10-20 (×2): 2 [IU] via SUBCUTANEOUS
  Administered 2017-10-21: 3 [IU] via SUBCUTANEOUS
  Administered 2017-10-21: 2 [IU] via SUBCUTANEOUS

## 2017-10-17 MED ORDER — MELATONIN 3 MG PO TABS: 3.0000 mg | ORAL_TABLET | Freq: Every day | ORAL | Status: DC

## 2017-10-17 MED ORDER — DEXTROSE 5 % IV SOLN
1.0000 g | Freq: Three times a day (TID) | INTRAVENOUS | Status: DC
  Administered 2017-10-17 – 2017-10-21 (×11): 1 g via INTRAVENOUS
  Filled 2017-10-17 (×15): qty 1

## 2017-10-17 MED ORDER — IPRATROPIUM-ALBUTEROL 0.5-2.5 (3) MG/3ML IN SOLN
3.0000 mL | Freq: Once | RESPIRATORY_TRACT | Status: AC
  Administered 2017-10-17: 3 mL via RESPIRATORY_TRACT
  Filled 2017-10-17: qty 3

## 2017-10-17 MED ORDER — AZITHROMYCIN 500 MG IV SOLR
500.0000 mg | INTRAVENOUS | Status: DC
Start: 2017-10-17 — End: 2017-10-21
  Administered 2017-10-17 – 2017-10-20 (×4): 500 mg via INTRAVENOUS
  Filled 2017-10-17 (×5): qty 500

## 2017-10-17 MED ORDER — ENOXAPARIN SODIUM 40 MG/0.4ML ~~LOC~~ SOLN
40.0000 mg | SUBCUTANEOUS | Status: DC
  Administered 2017-10-17 – 2017-10-21 (×5): 40 mg via SUBCUTANEOUS
  Filled 2017-10-17 (×5): qty 0.4

## 2017-10-17 MED ORDER — VITAMIN D 1000 UNITS PO TABS
5000.0000 [IU] | ORAL_TABLET | Freq: Every day | ORAL | Status: DC
  Administered 2017-10-17 – 2017-10-21 (×5): 5000 [IU] via ORAL
  Filled 2017-10-17 (×5): qty 5

## 2017-10-17 MED ORDER — SODIUM CHLORIDE 0.9 % IV SOLN
INTRAVENOUS | Status: DC
  Administered 2017-10-17 (×2): via INTRAVENOUS
  Administered 2017-10-18: 1 mL via INTRAVENOUS
  Administered 2017-10-18 – 2017-10-20 (×4): via INTRAVENOUS

## 2017-10-17 MED ORDER — VANCOMYCIN HCL IN DEXTROSE 1-5 GM/200ML-% IV SOLN
1000.0000 mg | Freq: Two times a day (BID) | INTRAVENOUS | Status: DC
  Administered 2017-10-17 – 2017-10-21 (×8): 1000 mg via INTRAVENOUS
  Filled 2017-10-17 (×8): qty 200

## 2017-10-17 MED ORDER — GABAPENTIN 300 MG PO CAPS
300.0000 mg | ORAL_CAPSULE | Freq: Every day | ORAL | Status: DC
  Administered 2017-10-17 – 2017-10-20 (×4): 300 mg via ORAL
  Filled 2017-10-17 (×4): qty 1

## 2017-10-17 MED ORDER — ADULT MULTIVITAMIN W/MINERALS CH
1.0000 | ORAL_TABLET | Freq: Every day | ORAL | Status: DC
  Administered 2017-10-17 – 2017-10-21 (×5): 1 via ORAL
  Filled 2017-10-17 (×5): qty 1

## 2017-10-17 MED ORDER — SODIUM CHLORIDE 0.9 % IV BOLUS (SEPSIS)
500.0000 mL | Freq: Once | INTRAVENOUS | Status: AC
  Administered 2017-10-17: 500 mL via INTRAVENOUS

## 2017-10-17 MED ORDER — METOPROLOL TARTRATE 50 MG PO TABS
50.0000 mg | ORAL_TABLET | Freq: Two times a day (BID) | ORAL | Status: DC
  Administered 2017-10-17 – 2017-10-21 (×9): 50 mg via ORAL
  Filled 2017-10-17 (×9): qty 1

## 2017-10-17 MED ORDER — ASPIRIN EC 81 MG PO TBEC
81.0000 mg | DELAYED_RELEASE_TABLET | Freq: Two times a day (BID) | ORAL | Status: DC
  Administered 2017-10-17 – 2017-10-21 (×9): 81 mg via ORAL
  Filled 2017-10-17 (×8): qty 1

## 2017-10-17 MED ORDER — SIMVASTATIN 10 MG PO TABS
40.0000 mg | ORAL_TABLET | Freq: Every day | ORAL | Status: DC
  Administered 2017-10-17 – 2017-10-20 (×4): 40 mg via ORAL
  Filled 2017-10-17 (×4): qty 4

## 2017-10-17 MED ORDER — GABAPENTIN 100 MG PO CAPS
200.0000 mg | ORAL_CAPSULE | Freq: Every morning | ORAL | Status: DC
  Administered 2017-10-17 – 2017-10-21 (×5): 200 mg via ORAL
  Filled 2017-10-17 (×5): qty 2

## 2017-10-17 MED ORDER — DEXTROSE 5 % IV SOLN
2.0000 g | Freq: Once | INTRAVENOUS | Status: AC
  Administered 2017-10-17: 2 g via INTRAVENOUS
  Filled 2017-10-17: qty 2

## 2017-10-17 MED ORDER — POLYETHYLENE GLYCOL 3350 17 G PO PACK
17.0000 g | PACK | Freq: Every day | ORAL | Status: DC
  Administered 2017-10-19 – 2017-10-21 (×3): 17 g via ORAL
  Filled 2017-10-17 (×3): qty 1

## 2017-10-17 MED ORDER — METHOCARBAMOL 500 MG PO TABS
500.0000 mg | ORAL_TABLET | Freq: Four times a day (QID) | ORAL | Status: DC | PRN
  Administered 2017-10-17 – 2017-10-21 (×6): 500 mg via ORAL
  Filled 2017-10-17 (×6): qty 1

## 2017-10-17 MED ORDER — MAGNESIUM OXIDE 400 (241.3 MG) MG PO TABS
400.0000 mg | ORAL_TABLET | Freq: Every day | ORAL | Status: DC
  Administered 2017-10-17 – 2017-10-21 (×5): 400 mg via ORAL
  Filled 2017-10-17 (×5): qty 1

## 2017-10-17 MED ORDER — VANCOMYCIN HCL IN DEXTROSE 1-5 GM/200ML-% IV SOLN
1000.0000 mg | Freq: Once | INTRAVENOUS | Status: AC
  Administered 2017-10-17: 1000 mg via INTRAVENOUS
  Filled 2017-10-17: qty 200

## 2017-10-17 NOTE — Progress Notes (Addendum)
CRITICAL VALUE ALERT  Critical Value:  Lactic Acid:2.3  Date & Time Notied:  10/17/17 1258  Provider Notified: Daleen Bo   Orders Received/Actions taken: IV bolus and increase fluid to 166ml/hr

## 2017-10-17 NOTE — Progress Notes (Signed)
CRITICAL VALUE ALERT  Critical Value:  Lactic acid 2.5 Date & Time Notied:  10/17/17 1550  Provider Notified: Daleen Bo  Orders Received/Actions taken:

## 2017-10-17 NOTE — H&P (Signed)
[facility] History and Physical  Joshua Fletcher XKG:[id number] DOB: 02-22-1936 DOA: 10/17/2017  Referring physician:  PCP: Sinda Du, MD  Specialists:   Chief Complaint: cough, fever   HPI: Joshua Fletcher is a 81 y.o. male with PMH of HTN, CAD, COPD, DM, recent hip fracture presented from [facility] rehab facility with subjective fevers associated with productive cough for several days. He denies acute chest pains, no shortness of breath. But noted to be hypoxic in emergency room at 78% which has resolved with low flow oxygen. He denies nausea, vomiting, no diarrhea, no abdominal pains. No leg edema, no PND or orthopnea's.  -ED: chest x ray showed right lung infiltrate. POX- 78 improved to >95% with low flow oxygen. Started on iv antibiotic. hospitalist is called for admission   Review of Systems: The patient denies anorexia, fever, weight loss,, vision loss, decreased hearing, hoarseness, chest pain, syncope, dyspnea on exertion, peripheral edema, balance deficits, hemoptysis, abdominal pain, melena, hematochezia, severe indigestion/heartburn, hematuria, incontinence, genital sores, muscle weakness, suspicious skin lesions, transient blindness, difficulty walking, depression, unusual weight change, abnormal bleeding, enlarged lymph nodes, angioedema, and breast masses.    Past Medical History:  Diagnosis Date  . Arthritis   . Constipation   . COPD (chronic obstructive pulmonary disease) ([place])   . Coronary artery disease   . Diabetes mellitus   . Hypertension   . Skin cancer of face    Past Surgical History:  Procedure Laterality Date  . APPENDECTOMY  1958  . BACK SURGERY    . CORONARY STENT PLACEMENT    . FEMUR IM NAIL Right 08/23/2017   Procedure: INTRAMEDULLARY (IM) NAIL FEMORAL;  Surgeon: Nicholes Stairs, MD;  Location: [place];  Service: Orthopedics;  Laterality: Right;  . SKIN CANCER EXCISION  07/2017   Social History:  reports that he has been smoking cigarettes.  He  has a 25.00 pack-year smoking history. he has never used smokeless tobacco. He reports that he does not drink alcohol or use drugs. Rehab;  where does patient live--home, ALF, SNF? and with whom if at home? No;  Can patient participate in ADLs?  No Known Allergies  Family History  Problem Relation Age of Onset  . Diabetes Mother     (be sure to complete)  Prior to Admission medications   Medication Sig Start Date End Date Taking? Authorizing Provider  aspirin EC 81 MG tablet Take 81 mg by mouth 2 (two) times daily.   Yes [provider]  Janne Lab Oil [facility]) OINT Apply to sacrum and bilateral buttocks q shift and prn erythema. Every shift   Yes [provider]  cholecalciferol (VITAMIN D) 1000 units tablet Take 5,000 Units by mouth daily.   Yes [provider]  gabapentin (NEURONTIN) 100 MG capsule Take 200 mg every morning by mouth.   Yes [provider]  gabapentin (NEURONTIN) 300 MG capsule Take 1 capsule (300 mg total) by mouth at bedtime. 08/26/17  Yes Rosita Fire, MD  insulin aspart (NOVOLOG FLEXPEN) 100 UNIT/ML FlexPen Inject into the skin 3 (three) times daily with meals. sliding scale   Yes [provider]  ipratropium-albuterol (DUONEB) 0.5-2.5 (3) MG/3ML SOLN Take 3 mLs by nebulization every 6 (six) hours as needed.   Yes [provider]  magnesium gluconate (MAGONATE) 500 MG tablet Take 500 mg by mouth daily.   Yes [provider]  Melatonin 3 MG TABS Take 3 mg by mouth at bedtime.   Yes [provider]  metFORMIN (GLUCOPHAGE) 1000 MG tablet Take 1 tablet (1,000 mg total) by mouth 2 (two) times daily with a meal. 08/18/17  Yes Nida, Marella Chimes, MD  methocarbamol (ROBAXIN) 500 MG tablet Take 1 tablet (500 mg total) by mouth every 6 (six) hours as needed for muscle spasms. 08/26/17  Yes Rosita Fire, MD  metoprolol tartrate (LOPRESSOR) 50 MG tablet Take 50 mg by mouth 2 (two)  times daily.    Yes [provider]  Multiple Vitamin (MULTIVITAMIN WITH MINERALS) TABS tablet Take 1 tablet by mouth daily.   Yes [provider]  ondansetron (ZOFRAN) 4 MG tablet Take 4 mg by mouth every 6 (six) hours as needed for nausea or vomiting.   Yes [provider]  Phenyleph-CPM-DM-Aspirin (ALKA-SELTZER PLUS COLD & COUGH PO) Take 2 tablets by mouth daily as needed (nasal congestion).   Yes [provider]  polyethylene glycol (MIRALAX / GLYCOLAX) packet Take 17 g by mouth daily. 08/27/17  Yes Rosita Fire, MD  simvastatin (ZOCOR) 40 MG tablet Take 40 mg by mouth daily.   Yes [provider]  tamsulosin (FLOMAX) 0.4 MG CAPS capsule Take 0.4 mg by mouth.   Yes [provider]  trolamine salicylate (ASPERCREME) 10 % cream Apply 1 application topically as needed for muscle pain.   Yes [provider]  Misc Natural Products (OSTEO BI-FLEX ADV TRIPLE ST PO) Take 1 tablet by mouth once a day    [provider]   Physical Exam: Vitals:   10/17/17 0930 10/17/17 0938  BP: 116/66   Pulse: 100   Resp: (!) 24   Temp:    SpO2: 94% 95%     General:  Alert. No distress.   Eyes: eom-I, perrla   ENT: no oral ulcers   Neck: supple.   Cardiovascular: s1,s2 rrr  Respiratory: few rales Right lung   Abdomen: soft, nt, nd n  Skin: no rash   Musculoskeletal: no leg edema   Psychiatric: no hallucinations.   Neurologic: cn 2-+12 intact. Motor 5/5 BL symmetric   Labs on Admission:  Basic Metabolic Panel: Recent Labs  Lab 10/17/17 0900  NA 133*  K 4.5  CL 97*  CO2 27  GLUCOSE 212*  BUN 36*  CREATININE 1.21  CALCIUM 9.0   Liver Function Tests: Recent Labs  Lab 10/17/17 0900  AST 21  ALT 14*  ALKPHOS 100  BILITOT 0.7  PROT 7.2  ALBUMIN 3.0*   No results for input(s): LIPASE, AMYLASE in the last 168 hours. No results for input(s): AMMONIA in the last 168 hours. CBC: Recent Labs  Lab  10/17/17 0900  WBC 8.2  NEUTROABS 5.6  HGB 11.0*  HCT 34.9*  MCV 97.8  PLT 202   Cardiac Enzymes: Recent Labs  Lab 10/17/17 0900  TROPONINI 0.05*    BNP (last 3 results) Recent Labs    10/17/17 0900  BNP 245.0*    ProBNP (last 3 results) No results for input(s): PROBNP in the last 8760 hours.  CBG: No results for input(s): GLUCAP in the last 168 hours.  Radiological Exams on Admission: Dg Chest 2 View  Result Date: 10/17/2017 CLINICAL DATA:  Hypoxia.  Cough.  COPD. EXAM: CHEST  2 VIEW COMPARISON:  One-view chest x-ray 08/22/2017. Two-view chest x-ray 07/22/2011. FINDINGS: The heart size is normal. Pulmonary vascular congestion or mild edema superimposed on chronic changes of COPD. The right upper lobe airspace opacity is concerning for pneumonia. There are no definite effusions.  Atherosclerotic calcifications are again seen at the aorta. Postoperative changes are present in the cervical spine. IMPRESSION: 1. Right upper lobe pneumonia. 2. Mild pulmonary vascular congestion or edema superimposed on chronic changes of COPD. 3. Aortic atherosclerosis. Electronically Signed   By: San Morelle M.D.   On: 10/17/2017 09:28    EKG: Independently reviewed.   Assessment/Plan Active Problems:   Healthcare-associated pneumonia   COPD (chronic obstructive pulmonary disease) (HCC)   Type 2 diabetes mellitus (HCC)   Coronary artery disease   Hyponatremia   Acute kidney injury (HCC)  PMH of HTN, CAD, COPD, DM, recent hip fracture presented from [facility] rehab facility with subjective fevers associated with productive cough for several days. He denies acute chest pains, no shortness of breath and found to have pneumonia   Pneumonia. CXR: Right upper lobe pneumonia. Will cont iv antibiotic treatment, fu cultures. Cont oxygen, bronchodilators prn. Mild lactic acid elevation -1.93 on admission. Hemodynamically  stable. Will monitor, f/u labs, iv fluids.    Acute hypoxic respiratory  failure, likely due to pneumonia. Hypoxia->resolved with low flow oxygen. Cont as above,   COPD. No s/s of acute exacerbation. No wheezing on admission. Will cont bronchodilators.   AKI, likely prerenal. Will cont gentle iv fluid overnight. F/u urine output, renal labs AM  Mild elevated trop at 0.05 on admission. No acute chest pains. Will f/u troponins, check ecg, echo. Cont aspirin, statin, BB  DM. Will monitor on ISS. Hold metformin due to renal issues   None;  if consultant consulted, please document name and whether formally or informally consulted  Code Status: full (must indicate code status--if unknown or must be presumed, indicate so) Family Communication: d/w patient, his family. ED/MD (indicate person spoken with, if applicable, with phone number if by telephone) Disposition Plan: to be determined (indicate anticipated LOS)  Time spent: >45 minutes   Kinnie Feil [facility] Pager [phone]  If 7PM-7AM, please contact night-coverage [email or web address] Password [facility] 10/17/2017, 10:41 AM

## 2017-10-17 NOTE — ED Triage Notes (Signed)
Pt sent from [place] for evaluation of low oxygen saturations this morning.  Pt denies any complaint.  States he has had a cough.  Pt not normally on oxygen and states he does not feel any worse than normal.

## 2017-10-17 NOTE — Progress Notes (Signed)
RITICAL VALUE ALERT  Critical Value:  Troponin 0.05 Date & Time Notied:  10/17/17 1305  Provider Notified:[place]  Orders Received/Actions taken:

## 2017-10-17 NOTE — ED Provider Notes (Signed)
[facility] EMERGENCY DEPARTMENT Provider Note   CSN: [id number] Arrival date & time: 10/17/17  9518     History   Chief Complaint Chief Complaint  Patient presents with  . Shortness of Breath    HPI Joshua Fletcher is a 81 y.o. male.  The history is provided by the patient.  Shortness of Breath  This is a new problem. The current episode started 12 to 24 hours ago. The problem has not changed since onset.Associated symptoms include cough and sputum production. Pertinent negatives include no fever, no rhinorrhea, no chest pain, no syncope and no vomiting. Risk factors include recent leg injury. He has tried nothing for the symptoms.   81 year old male who presents with cough.  He has a history of COPD, CAD, and diabetes.  He is from [place] for rehab for recent right subtrochanteric femur fracture status post operative repair in August 23, 2017.  He reports 2 weeks of cough productive of dark sputum with occasional aches and pains.  He was noted to be hypoxic on room air today at the nursing facility and sent to ED for evaluation.  He has not had known fever, night sweats, vomiting or diarrhea, chest pain, lower extremity edema, or calf tenderness.  Normally does not wear oxygen.   Past Medical History:  Diagnosis Date  . Arthritis   . Constipation   . COPD (chronic obstructive pulmonary disease) ([place])   . Coronary artery disease   . Diabetes mellitus   . Hypertension   . Skin cancer of face     Patient Active Problem List   Diagnosis Date Noted  . S/P Intramedullary implant right femur 08/28/2017  . Acute blood loss anemia   . Acute kidney injury ([place])   . Displaced subtrochanteric fracture of right femur, initial encounter for closed fracture ([place]) 08/22/2017  . COPD (chronic obstructive pulmonary disease) ([place]) 08/22/2017  . Smoker 08/22/2017  . Type 2 diabetes mellitus ([place]) 08/22/2017  . Coronary artery disease 08/22/2017  . Thrombocytopenia ([place]) 08/22/2017    . Hyponatremia 08/22/2017  . Leukocytosis 08/22/2017  . Mass of oropharynx 08/22/2017  . Cervical spondylosis 08/22/2017  . Melanoma of skin ([place]) 02/13/2017  . Mixed hyperlipidemia 10/24/2015  . Essential hypertension, benign 10/24/2015    Past Surgical History:  Procedure Laterality Date  . APPENDECTOMY  1958  . BACK SURGERY    . CORONARY STENT PLACEMENT    . FEMUR IM NAIL Right 08/23/2017   Procedure: INTRAMEDULLARY (IM) NAIL FEMORAL;  Surgeon: Nicholes Stairs, MD;  Location: [place];  Service: Orthopedics;  Laterality: Right;  . SKIN CANCER EXCISION  07/2017       Home Medications    Prior to Admission medications   Medication Sig Start Date End Date Taking? Authorizing Provider  aspirin EC 81 MG tablet Take 81 mg by mouth 2 (two) times daily.    [provider]  Janne Lab Oil [facility]) OINT Apply to sacrum and bilateral buttocks q shift and prn erythema. Every shift    [provider]  cholecalciferol (VITAMIN D) 1000 units tablet Take 5,000 Units by mouth daily.    [provider]  gabapentin (NEURONTIN) 100 MG capsule Take 200 mg every morning by mouth.    [provider]  gabapentin (NEURONTIN) 300 MG capsule Take 1 capsule (300 mg total) by mouth at bedtime. 08/26/17   Rosita Fire, MD  ipratropium-albuterol (DUONEB) 0.5-2.5 (3) MG/3ML SOLN Take 3 mLs by nebulization every 6 (six)  hours as needed.    [provider]  magnesium gluconate (MAGONATE) 500 MG tablet Take 500 mg by mouth daily.    [provider]  Melatonin 3 MG TABS Take 3 mg by mouth at bedtime.    [provider]  metFORMIN (GLUCOPHAGE) 1000 MG tablet Take 1 tablet (1,000 mg total) by mouth 2 (two) times daily with a meal. 08/18/17   Nida, Marella Chimes, MD  methocarbamol (ROBAXIN) 500 MG tablet Take 1 tablet (500 mg total) by mouth every 6 (six) hours as needed for muscle spasms. 08/26/17   Rosita Fire, MD   Metoprolol Tartrate 75 MG TABS Take 75 mg by mouth 2 (two) times daily.    [provider]  Misc Natural Products (OSTEO BI-FLEX ADV TRIPLE ST PO) Take 1 tablet by mouth once a day    [provider]  Multiple Vitamin (MULTIVITAMIN WITH MINERALS) TABS tablet Take 1 tablet by mouth daily.    [provider]  ondansetron (ZOFRAN) 4 MG tablet Take 4 mg by mouth every 6 (six) hours as needed for nausea or vomiting.    [provider]  Phenyleph-CPM-DM-Aspirin (ALKA-SELTZER PLUS COLD & COUGH PO) Take 2 tablets by mouth daily as needed (nasal congestion).    [provider]  polyethylene glycol (MIRALAX / GLYCOLAX) packet Take 17 g by mouth daily. 08/27/17   Rosita Fire, MD  simvastatin (ZOCOR) 40 MG tablet Take 40 mg by mouth daily.    [provider]  tamsulosin (FLOMAX) 0.4 MG CAPS capsule Take 0.4 mg by mouth.    [provider]  trolamine salicylate (ASPERCREME) 10 % cream Apply 1 application topically as needed for muscle pain.    [provider]    Family History Family History  Problem Relation Age of Onset  . Diabetes Mother     Social History Social History   Tobacco Use  . Smoking status: Current Every Day Smoker    Packs/day: 1.00    Years: 25.00    Pack years: 25.00    Types: Cigarettes  . Smokeless tobacco: Never Used  Substance Use Topics  . Alcohol use: No  . Drug use: No     Allergies   Patient has no known allergies.   Review of Systems Review of Systems  Constitutional: Negative for fever.  HENT: Negative for rhinorrhea.   Respiratory: Positive for cough, sputum production and shortness of breath.   Cardiovascular: Negative for chest pain and syncope.  Gastrointestinal: Negative for vomiting.  All other systems reviewed and are negative.    Physical Exam Updated Vital Signs BP 116/66   Pulse 100   Temp 98.5 F (36.9 C) (Oral)   Resp (!) 24   Ht 6\' 2"  (1.88 m)   Wt  78.5 kg (173 lb)   SpO2 95%   BMI 22.21 kg/m   Physical Exam Physical Exam  Nursing note and vitals reviewed. Constitutional: Elderly man, Well developed, well nourished, non-toxic, and in no acute distress Head: Normocephalic and atraumatic.  Mouth/Throat: Oropharynx is clear and moist.  Neck: Normal range of motion. Neck supple.  Cardiovascular: Normal rate and regular rhythm.   Pulmonary/Chest: Effort normal. No conversational dyspnea. Coarse wheezing in left lower lung. Diminished breath sounds over right lung. Abdominal: Soft. There is no tenderness. There is no rebound and no guarding.  Musculoskeletal: Normal range of motion. No lower extremity edema or tenderness. Neurological: Alert, no facial droop, fluent speech, moves all extremities symmetrically Skin:  Skin is warm and dry.  Psychiatric: Cooperative   ED Treatments / Results  Labs (all labs ordered are listed, but only abnormal results are displayed) Labs Reviewed  CBC WITH DIFFERENTIAL/PLATELET - Abnormal; Notable for the following components:      Result Value   RBC 3.57 (*)    Hemoglobin 11.0 (*)    HCT 34.9 (*)    All other components within normal limits  COMPREHENSIVE METABOLIC PANEL - Abnormal; Notable for the following components:   Sodium 133 (*)    Chloride 97 (*)    Glucose, Bld 212 (*)    BUN 36 (*)    Albumin 3.0 (*)    ALT 14 (*)    GFR calc non Af Amer 56 (*)    All other components within normal limits  TROPONIN I - Abnormal; Notable for the following components:   Troponin I 0.05 (*)    All other components within normal limits  BRAIN NATRIURETIC PEPTIDE - Abnormal; Notable for the following components:   B Natriuretic Peptide 245.0 (*)    All other components within normal limits  I-STAT CG4 LACTIC ACID, ED - Abnormal; Notable for the following components:   Lactic Acid, Venous 1.93 (*)    All other components within normal limits  URINALYSIS, ROUTINE W REFLEX MICROSCOPIC    EKG  EKG  Interpretation  Date/Time:  Friday October 17 2017 08:55:11 EST Ventricular Rate:  115 PR Interval:    QRS Duration: 80 QT Interval:  320 QTC Calculation: 443 R Axis:   102 Text Interpretation:  Sinus tachycardia Multiple premature complexes, vent & supraven Right axis deviation Borderline ST depression, lateral leads Confirmed by Brantley Stage [phone]) on 10/17/2017 8:58:12 AM       Radiology Dg Chest 2 View  Result Date: 10/17/2017 CLINICAL DATA:  Hypoxia.  Cough.  COPD. EXAM: CHEST  2 VIEW COMPARISON:  One-view chest x-ray 08/22/2017. Two-view chest x-ray 07/22/2011. FINDINGS: The heart size is normal. Pulmonary vascular congestion or mild edema superimposed on chronic changes of COPD. The right upper lobe airspace opacity is concerning for pneumonia. There are no definite effusions. Atherosclerotic calcifications are again seen at the aorta. Postoperative changes are present in the cervical spine. IMPRESSION: 1. Right upper lobe pneumonia. 2. Mild pulmonary vascular congestion or edema superimposed on chronic changes of COPD. 3. Aortic atherosclerosis. Electronically Signed   By: San Morelle M.D.   On: 10/17/2017 09:28    Procedures Procedures (including critical care time)  Medications Ordered in ED Medications  vancomycin (VANCOCIN) IVPB 1000 mg/200 mL premix (not administered)  ceFEPIme (MAXIPIME) 2 g in dextrose 5 % 50 mL IVPB (not administered)  ipratropium-albuterol (DUONEB) 0.5-2.5 (3) MG/3ML nebulizer solution 3 mL (3 mLs Nebulization Given 10/17/17 0937)     Initial Impression / Assessment and Plan / ED Course  I have reviewed the triage vital signs and the nursing notes.  Pertinent labs & imaging results that were available during my care of the patient were reviewed by me and considered in my medical decision making (see chart for details).     81 year old male who presents with cough and hypoxia.  77% on room air on arrival, but does not appear to be in any  respiratory distress.  Is placed on 2 L of oxygen with improvement. Has some tachypnea and tachycardia, but afebrile and normotensive.   CXR visualized, and he does have evidence of RUL pneumonia. Will treat for HCAP given recent inpatient hospitalization and now residing  in nursing facility. Received vancomycin, cefepime, and 1L of IVF. He does have mildly elevated lactic acid of 1.9. No significant leukocytosis. Stable anemia. Slight troponin elevation , but no chest pain. Likely demand from pneumonia and hypoxia.  Discussed with Dr. Daleen Bo who will admit   Final Clinical Impressions(s) / ED Diagnoses   Final diagnoses:  Lobar pneumonia ([place])  HCAP (healthcare-associated pneumonia)  Acute respiratory failure with hypoxia [facility])    ED Discharge Orders    None       Forde Dandy, MD 10/17/17 1009

## 2017-10-17 NOTE — Progress Notes (Signed)
Pharmacy Antibiotic Note  Joshua Fletcher is a 81 y.o. male admitted on 10/17/2017 with pneumonia.  Pharmacy has been consulted for Vancomycin and Cefepime dosing.  Pt is also on Zithromax for atypicals, D/W Dr Daleen Bo.   Plan:  Vancomycin 1000mg  IV q12h Check trough at steady state Cefepime 1gm IV q8h Zithromax 500mg  IV q24hrs per MD Monitor labs, renal fxn, progress and c/s Deescalate ABX when improved / appropriate.    Antimicrobials this admission: Vancomycin 11/23 >>  Cefepime 11/23 >>  Zithromax 11/23 >>  Height: 6\' 2"  (188 cm) Weight: 173 lb (78.5 kg) IBW/kg (Calculated) : 82.2  Temp (24hrs), Avg:98.3 F (36.8 C), Min:98 F (36.7 C), Max:98.5 F (36.9 C)  Recent Labs  Lab 10/17/17 0900 10/17/17 0922  WBC 8.2  --   CREATININE 1.21  --   LATICACIDVEN  --  1.93*    Estimated Creatinine Clearance: 55.9 mL/min (by C-G formula based on SCr of 1.21 mg/dL).  No Known Allergies  Dose adjustments this admission:  Microbiology results:  BCx: pending  UCx: pending   Sputum:    MRSA PCR:   Thank you for allowing pharmacy to be a part of this patient's care.  Joshua Fletcher, PharmD Clinical Pharmacist Pager:  [phone] 10/17/2017  Joshua Fletcher A 10/17/2017 12:15 PM

## 2017-10-17 NOTE — Progress Notes (Signed)
*  PRELIMINARY RESULTS* Echocardiogram 2D Echocardiogram has been performed.  Joshua Fletcher 10/17/2017, 3:46 PM

## 2017-10-17 NOTE — ED Notes (Signed)
CRITICAL VALUE ALERT  Critical Value:  Troponin 0.05  Date & Time Notied:  10/17/17 0955  Provider Notified: MD Oleta Mouse   Orders Received/Actions taken: none

## 2017-10-18 DIAGNOSIS — IMO0001 Reserved for inherently not codable concepts without codable children: Secondary | ICD-10-CM | POA: Diagnosis present

## 2017-10-18 LAB — BASIC METABOLIC PANEL
Anion gap: 7 (ref 5–15)
BUN: 24 mg/dL — AB (ref 6–20)
CHLORIDE: 100 mmol/L — AB (ref 101–111)
CO2: 26 mmol/L (ref 22–32)
Calcium: 8.5 mg/dL — ABNORMAL LOW (ref 8.9–10.3)
Creatinine, Ser: 1 mg/dL (ref 0.61–1.24)
GFR calc Af Amer: >60 mL/min (ref >60)
GFR calc non Af Amer: >60 mL/min (ref >60)
GLUCOSE: 249 mg/dL — AB (ref 65–99)
POTASSIUM: 4.4 mmol/L (ref 3.5–5.1)
Sodium: 133 mmol/L — ABNORMAL LOW (ref 135–145)

## 2017-10-18 LAB — MRSA PCR SCREENING: MRSA by PCR: POSITIVE — AB

## 2017-10-18 LAB — CBC
HEMATOCRIT: 30.9 % — AB (ref 39.0–52.0)
Hemoglobin: 9.7 g/dL — ABNORMAL LOW (ref 13.0–17.0)
MCH: 30.7 pg (ref 26.0–34.0)
MCHC: 31.4 g/dL (ref 30.0–36.0)
MCV: 97.8 fL (ref 78.0–100.0)
Platelets: 207 10*3/uL (ref 150–400)
RBC: 3.16 MIL/uL — ABNORMAL LOW (ref 4.22–5.81)
RDW: 15 % (ref 11.5–15.5)
WBC: 7.1 10*3/uL (ref 4.0–10.5)

## 2017-10-18 LAB — URINALYSIS, ROUTINE W REFLEX MICROSCOPIC
BILIRUBIN URINE: NEGATIVE
Bacteria, UA: NONE SEEN
HGB URINE DIPSTICK: NEGATIVE
KETONES UR: NEGATIVE mg/dL
LEUKOCYTES UA: NEGATIVE
NITRITE: NEGATIVE
PH: 5 (ref 5.0–8.0)
PROTEIN: 30 mg/dL — AB
Specific Gravity, Urine: 1.014 (ref 1.005–1.030)

## 2017-10-18 LAB — GLUCOSE, CAPILLARY
Glucose-Capillary: 256 mg/dL — ABNORMAL HIGH (ref 65–99)
Glucose-Capillary: 239 mg/dL — ABNORMAL HIGH (ref 65–99)
Glucose-Capillary: 190 mg/dL — ABNORMAL HIGH (ref 65–99)
Glucose-Capillary: 434 mg/dL — ABNORMAL HIGH (ref 65–99)

## 2017-10-18 LAB — ECHOCARDIOGRAM COMPLETE
Height: 74 in
WEIGHTICAEL: 2768 [oz_av]

## 2017-10-18 MED ORDER — INSULIN GLARGINE 100 UNIT/ML ~~LOC~~ SOLN
10.0000 [IU] | Freq: Every day | SUBCUTANEOUS | Status: DC
  Administered 2017-10-18 – 2017-10-20 (×3): 10 [IU] via SUBCUTANEOUS
  Filled 2017-10-18 (×4): qty 0.1

## 2017-10-18 MED ORDER — SALINE SPRAY 0.65 % NA SOLN
1.0000 | NASAL | Status: DC | PRN
  Administered 2017-10-18 – 2017-10-20 (×2): 1 via NASAL
  Filled 2017-10-18: qty 44

## 2017-10-18 NOTE — Progress Notes (Signed)
Subjective: He was admitted yesterday with pneumonia from a skilled care facility.  He has had a mildly elevated lactate so I think he has systemic inflammatory response syndrome as well.  Objective: Vital signs in last 24 hours: Temp:  [98 F (36.7 C)-99.1 F (37.3 C)] 99.1 F (37.3 C) (11/23 2124) Pulse Rate:  [84-103] 84 (11/23 2124) Resp:  [17-28] 18 (11/23 2124) BP: (116-135)/(56-67) 135/56 (11/23 2124) SpO2:  [91 %-94 %] 91 % (11/23 2124) Weight change:  Last BM Date: 10/17/17  Intake/Output from previous day: 11/23 0701 - 11/24 0700 In: 707.9 [P.O.:240; I.V.:417.9; IV Piggyback:50] Out: 300 [Urine:300]  PHYSICAL EXAM General appearance: alert, cooperative and no distress Resp: He has bilateral rhonchi.  Right more than left. Cardio: regular rate and rhythm, S1, S2 normal, no murmur, click, rub or gallop GI: soft, non-tender; bowel sounds normal; no masses,  no organomegaly Extremities: extremities normal, atraumatic, no cyanosis or edema Skin warm and dry  Lab Results:  Results for orders placed or performed during the hospital encounter of 10/17/17 (from the past 48 hour(s))  CBC with Differential     Status: Abnormal   Collection Time: 10/17/17  9:00 AM  Result Value Ref Range   WBC 8.2 4.0 - 10.5 K/uL   RBC 3.57 (L) 4.22 - 5.81 MIL/uL   Hemoglobin 11.0 (L) 13.0 - 17.0 g/dL   HCT 34.9 (L) 39.0 - 52.0 %   MCV 97.8 78.0 - 100.0 fL   MCH 30.8 26.0 - 34.0 pg   MCHC 31.5 30.0 - 36.0 g/dL   RDW 14.8 11.5 - 15.5 %   Platelets 202 150 - 400 K/uL   Neutrophils Relative % 68 %   Neutro Abs 5.6 1.7 - 7.7 K/uL   Lymphocytes Relative 16 %   Lymphs Abs 1.3 0.7 - 4.0 K/uL   Monocytes Relative 11 %   Monocytes Absolute 0.9 0.1 - 1.0 K/uL   Eosinophils Relative 5 %   Eosinophils Absolute 0.4 0.0 - 0.7 K/uL   Basophils Relative 0 %   Basophils Absolute 0.0 0.0 - 0.1 K/uL  Comprehensive metabolic panel     Status: Abnormal   Collection Time: 10/17/17  9:00 AM  Result  Value Ref Range   Sodium 133 (L) 135 - 145 mmol/L   Potassium 4.5 3.5 - 5.1 mmol/L   Chloride 97 (L) 101 - 111 mmol/L   CO2 27 22 - 32 mmol/L   Glucose, Bld 212 (H) 65 - 99 mg/dL   BUN 36 (H) 6 - 20 mg/dL   Creatinine, Ser 1.21 0.61 - 1.24 mg/dL   Calcium 9.0 8.9 - 10.3 mg/dL   Total Protein 7.2 6.5 - 8.1 g/dL   Albumin 3.0 (L) 3.5 - 5.0 g/dL   AST 21 15 - 41 U/L   ALT 14 (L) 17 - 63 U/L   Alkaline Phosphatase 100 38 - 126 U/L   Total Bilirubin 0.7 0.3 - 1.2 mg/dL   GFR calc non Af Amer 56 (L) >60 mL/min   GFR calc Af Amer >60 >60 mL/min    Comment: (NOTE) The eGFR has been calculated using the CKD EPI equation. This calculation has not been validated in all clinical situations. eGFR's persistently <60 mL/min signify possible Chronic Kidney Disease.    Anion gap 9 5 - 15  Troponin I     Status: Abnormal   Collection Time: 10/17/17  9:00 AM  Result Value Ref Range   Troponin I 0.05 (HH) <0.03 ng/mL  Comment: CRITICAL RESULT CALLED TO, READ BACK BY AND VERIFIED WITH: EVERETT,R AT 9:55AM ON 10/17/17 BY [facility]   Brain natriuretic peptide     Status: Abnormal   Collection Time: 10/17/17  9:00 AM  Result Value Ref Range   B Natriuretic Peptide 245.0 (H) 0.0 - 100.0 pg/mL  I-Stat CG4 Lactic Acid, ED     Status: Abnormal   Collection Time: 10/17/17  9:22 AM  Result Value Ref Range   Lactic Acid, Venous 1.93 (H) 0.5 - 1.9 mmol/L  Glucose, capillary     Status: Abnormal   Collection Time: 10/17/17 11:46 AM  Result Value Ref Range   Glucose-Capillary 287 (H) 65 - 99 mg/dL  Troponin I     Status: Abnormal   Collection Time: 10/17/17 11:53 AM  Result Value Ref Range   Troponin I 0.05 (HH) <0.03 ng/mL    Comment: CRITICAL RESULT CALLED TO, READ BACK BY AND VERIFIED WITH: HOBBS,A @ 1305 ON 11.23.18 BY BOWMAN,L   Lactic acid, plasma     Status: Abnormal   Collection Time: 10/17/17 11:53 AM  Result Value Ref Range   Lactic Acid, Venous 2.4 (HH) 0.5 - 1.9 mmol/L    Comment:  CRITICAL RESULT CALLED TO, READ BACK BY AND VERIFIED WITH: HOBBS,A @ 1256 ON 11.23.18 BY BOWMAN,L   Lactic acid, plasma     Status: Abnormal   Collection Time: 10/17/17  2:17 PM  Result Value Ref Range   Lactic Acid, Venous 2.5 (HH) 0.5 - 1.9 mmol/L    Comment: CRITICAL RESULT CALLED TO, READ BACK BY AND VERIFIED WITH: HOBBS,A ON 10/17/17 AT 1535 BY LOY,C   Glucose, capillary     Status: Abnormal   Collection Time: 10/17/17  4:13 PM  Result Value Ref Range   Glucose-Capillary 215 (H) 65 - 99 mg/dL  Lactic acid, plasma     Status: None   Collection Time: 10/17/17  6:59 PM  Result Value Ref Range   Lactic Acid, Venous 1.6 0.5 - 1.9 mmol/L  Glucose, capillary     Status: Abnormal   Collection Time: 10/17/17  9:28 PM  Result Value Ref Range   Glucose-Capillary 236 (H) 65 - 99 mg/dL  Urinalysis, Routine w reflex microscopic     Status: Abnormal   Collection Time: 10/18/17  2:10 AM  Result Value Ref Range   Color, Urine YELLOW YELLOW   APPearance HAZY (A) CLEAR   Specific Gravity, Urine 1.014 1.005 - 1.030   pH 5.0 5.0 - 8.0   Glucose, UA >=500 (A) NEGATIVE mg/dL   Hgb urine dipstick NEGATIVE NEGATIVE   Bilirubin Urine NEGATIVE NEGATIVE   Ketones, ur NEGATIVE NEGATIVE mg/dL   Protein, ur 30 (A) NEGATIVE mg/dL   Nitrite NEGATIVE NEGATIVE   Leukocytes, UA NEGATIVE NEGATIVE   RBC / HPF 0-5 0 - 5 RBC/hpf   WBC, UA 0-5 0 - 5 WBC/hpf   Bacteria, UA NONE SEEN NONE SEEN   Squamous Epithelial / LPF 0-5 (A) NONE SEEN   Mucus PRESENT   Basic metabolic panel     Status: Abnormal   Collection Time: 10/18/17  7:09 AM  Result Value Ref Range   Sodium 133 (L) 135 - 145 mmol/L   Potassium 4.4 3.5 - 5.1 mmol/L   Chloride 100 (L) 101 - 111 mmol/L   CO2 26 22 - 32 mmol/L   Glucose, Bld 249 (H) 65 - 99 mg/dL   BUN 24 (H) 6 - 20 mg/dL   Creatinine, Ser 1.00  0.61 - 1.24 mg/dL   Calcium 8.5 (L) 8.9 - 10.3 mg/dL   GFR calc non Af Amer >60 >60 mL/min   GFR calc Af Amer >60 >60 mL/min     Comment: (NOTE) The eGFR has been calculated using the CKD EPI equation. This calculation has not been validated in all clinical situations. eGFR's persistently <60 mL/min signify possible Chronic Kidney Disease.    Anion gap 7 5 - 15  CBC     Status: Abnormal   Collection Time: 10/18/17  7:09 AM  Result Value Ref Range   WBC 7.1 4.0 - 10.5 K/uL   RBC 3.16 (L) 4.22 - 5.81 MIL/uL   Hemoglobin 9.7 (L) 13.0 - 17.0 g/dL   HCT 30.9 (L) 39.0 - 52.0 %   MCV 97.8 78.0 - 100.0 fL   MCH 30.7 26.0 - 34.0 pg   MCHC 31.4 30.0 - 36.0 g/dL   RDW 15.0 11.5 - 15.5 %   Platelets 207 150 - 400 K/uL  Glucose, capillary     Status: Abnormal   Collection Time: 10/18/17  7:46 AM  Result Value Ref Range   Glucose-Capillary 256 (H) 65 - 99 mg/dL    ABGS No results for input(s): PHART, PO2ART, TCO2, HCO3 in the last 72 hours.  Invalid input(s): PCO2 CULTURES Recent Results (from the past 240 hour(s))  Culture, Urine     Status: Abnormal   Collection Time: 10/14/17 10:00 PM  Result Value Ref Range Status   Specimen Description URINE, CLEAN CATCH  Final   Special Requests NONE  Final   Culture (A)  Final    <10,000 COLONIES/mL INSIGNIFICANT GROWTH Performed at [facility], [address], [place], [place] [address]    Report Status 10/16/2017 FINAL  Final  Culture, Urine     Status: Abnormal   Collection Time: 10/15/17 12:00 PM  Result Value Ref Range Status   Specimen Description URINE, CLEAN CATCH  Final   Special Requests NONE  Final   Culture (A)  Final    <10,000 COLONIES/mL INSIGNIFICANT GROWTH Performed at [facility], [place] [address], [place], [place] [address]    Report Status 10/17/2017 FINAL  Final   Studies/Results: Dg Chest 2 View  Result Date: 10/17/2017 CLINICAL DATA:  Hypoxia.  Cough.  COPD. EXAM: CHEST  2 VIEW COMPARISON:  One-view chest x-ray 08/22/2017. Two-view chest x-ray 07/22/2011. FINDINGS: The heart size is normal. Pulmonary vascular congestion or  mild edema superimposed on chronic changes of COPD. The right upper lobe airspace opacity is concerning for pneumonia. There are no definite effusions. Atherosclerotic calcifications are again seen at the aorta. Postoperative changes are present in the cervical spine. IMPRESSION: 1. Right upper lobe pneumonia. 2. Mild pulmonary vascular congestion or edema superimposed on chronic changes of COPD. 3. Aortic atherosclerosis. Electronically Signed   By: San Morelle M.D.   On: 10/17/2017 09:28    Medications:  Prior to Admission:  Medications Prior to Admission  Medication Sig Dispense Refill Last Dose  . aspirin EC 81 MG tablet Take 81 mg by mouth 2 (two) times daily.   10/16/2017 at Unknown time  . Balsam [place]-Castor Oil (VENELEX) OINT Apply to sacrum and bilateral buttocks q shift and prn erythema. Every shift   10/17/2017 at Unknown time  . cholecalciferol (VITAMIN D) 1000 units tablet Take 5,000 Units by mouth daily.   10/16/2017 at Unknown time  . gabapentin (NEURONTIN) 100 MG capsule Take 200 mg every morning by mouth.  10/16/2017 at Unknown time  . gabapentin (NEURONTIN) 300 MG capsule Take 1 capsule (300 mg total) by mouth at bedtime.   10/16/2017 at Unknown time  . insulin aspart (NOVOLOG FLEXPEN) 100 UNIT/ML FlexPen Inject into the skin 3 (three) times daily with meals. sliding scale   10/17/2017 at Unknown time  . ipratropium-albuterol (DUONEB) 0.5-2.5 (3) MG/3ML SOLN Take 3 mLs by nebulization every 6 (six) hours as needed.   Taking  . magnesium gluconate (MAGONATE) 500 MG tablet Take 500 mg by mouth daily.   10/16/2017 at Unknown time  . Melatonin 3 MG TABS Take 3 mg by mouth at bedtime.   10/16/2017 at Unknown time  . metFORMIN (GLUCOPHAGE) 1000 MG tablet Take 1 tablet (1,000 mg total) by mouth 2 (two) times daily with a meal. 180 tablet 0 10/16/2017 at Unknown time  . methocarbamol (ROBAXIN) 500 MG tablet Take 1 tablet (500 mg total) by mouth every 6 (six) hours as needed for  muscle spasms.   Past Week at Unknown time  . metoprolol tartrate (LOPRESSOR) 50 MG tablet Take 50 mg by mouth 2 (two) times daily.    10/16/2017 at 2040  . Multiple Vitamin (MULTIVITAMIN WITH MINERALS) TABS tablet Take 1 tablet by mouth daily.   10/16/2017 at Unknown time  . ondansetron (ZOFRAN) 4 MG tablet Take 4 mg by mouth every 6 (six) hours as needed for nausea or vomiting.   Past Month at Unknown time  . Phenyleph-CPM-DM-Aspirin (ALKA-SELTZER PLUS COLD & COUGH PO) Take 2 tablets by mouth daily as needed (nasal congestion).   Taking  . polyethylene glycol (MIRALAX / GLYCOLAX) packet Take 17 g by mouth daily. 14 each 0 10/16/2017 at Unknown time  . simvastatin (ZOCOR) 40 MG tablet Take 40 mg by mouth daily.   10/16/2017 at Unknown time  . tamsulosin (FLOMAX) 0.4 MG CAPS capsule Take 0.4 mg by mouth.   10/16/2017 at Unknown time  . trolamine salicylate (ASPERCREME) 10 % cream Apply 1 application topically as needed for muscle pain.   Taking  . Misc Natural Products (OSTEO BI-FLEX ADV TRIPLE ST PO) Take 1 tablet by mouth once a day   Taking   Scheduled: . aspirin EC  81 mg Oral BID  . cholecalciferol  5,000 Units Oral Daily  . enoxaparin (LOVENOX) injection  40 mg Subcutaneous Q24H  . gabapentin  200 mg Oral q morning - 10a  . gabapentin  300 mg Oral QHS  . insulin aspart  0-9 Units Subcutaneous TID WC  . magnesium oxide  400 mg Oral Daily  . metoprolol tartrate  50 mg Oral BID  . multivitamin with minerals  1 tablet Oral Daily  . polyethylene glycol  17 g Oral Daily  . simvastatin  40 mg Oral q1800  . tamsulosin  0.4 mg Oral QPC breakfast   Continuous: . sodium chloride 1 mL (10/18/17 0840)  . azithromycin Stopped (10/17/17 1638)  . ceFEPime (MAXIPIME) IV Stopped (10/18/17 0553)  . vancomycin Stopped (10/18/17 0737)   [id number], methocarbamol  Assesment: He has healthcare associated pneumonia.  He is on appropriate treatment.  He has COPD and he is not having a  lot of trouble with shortness of breath although he had that at the skilled care facility.  He has systemic inflammatory response syndrome which is improved.  He has acute kidney injury which is improved.  He has diabetes and his blood sugar is still in the 200s swelling going to need to adjust his medications Active Problems:  COPD (chronic obstructive pulmonary disease) (HCC)   Type 2 diabetes mellitus (HCC)   Coronary artery disease   Hyponatremia   Acute kidney injury ([place])   Healthcare-associated pneumonia   HCAP (healthcare-associated pneumonia)    Plan: Continue IV antibiotics.  Hold on steroids for now.  Adjust insulin.  Continue other treatments.    LOS: 1 day   Dainelle Hun L 10/18/2017, 10:03 AM

## 2017-10-19 LAB — GLUCOSE, CAPILLARY
GLUCOSE-CAPILLARY: 207 mg/dL — AB (ref 65–99)
GLUCOSE-CAPILLARY: 196 mg/dL — AB (ref 65–99)
Glucose-Capillary: 263 mg/dL — ABNORMAL HIGH (ref 65–99)
Glucose-Capillary: 166 mg/dL — ABNORMAL HIGH (ref 65–99)

## 2017-10-19 MED ORDER — MUPIROCIN 2 % EX OINT
TOPICAL_OINTMENT | Freq: Two times a day (BID) | CUTANEOUS | Status: DC
  Administered 2017-10-19: 1 via TOPICAL
  Administered 2017-10-19 – 2017-10-21 (×4): via TOPICAL
  Filled 2017-10-19: qty 22

## 2017-10-19 MED ORDER — CHLORHEXIDINE GLUCONATE CLOTH 2 % EX PADS
6.0000 | MEDICATED_PAD | Freq: Every day | CUTANEOUS | Status: DC
  Administered 2017-10-19 – 2017-10-21 (×3): 6 via TOPICAL

## 2017-10-19 MED ORDER — GUAIFENESIN ER 600 MG PO TB12
1200.0000 mg | ORAL_TABLET | Freq: Two times a day (BID) | ORAL | Status: DC
  Administered 2017-10-19 – 2017-10-21 (×5): 1200 mg via ORAL
  Filled 2017-10-19 (×5): qty 2

## 2017-10-19 MED ORDER — HYPROMELLOSE (GONIOSCOPIC) 2.5 % OP SOLN: 1.0000 [drp] | OPHTHALMIC | Status: DC | PRN

## 2017-10-19 MED ORDER — MUPIROCIN 2 % EX OINT
1.0000 "application " | TOPICAL_OINTMENT | Freq: Two times a day (BID) | CUTANEOUS | Status: DC
  Administered 2017-10-19 – 2017-10-21 (×5): 1 via NASAL
  Filled 2017-10-19: qty 22

## 2017-10-19 MED ORDER — ACETAMINOPHEN 325 MG PO TABS
650.0000 mg | ORAL_TABLET | Freq: Four times a day (QID) | ORAL | Status: DC | PRN
  Administered 2017-10-19: 650 mg via ORAL
  Filled 2017-10-19: qty 2

## 2017-10-19 MED ORDER — POLYVINYL ALCOHOL 1.4 % OP SOLN
2.0000 [drp] | OPHTHALMIC | Status: DC | PRN
  Administered 2017-10-19: 2 [drp] via OPHTHALMIC
  Filled 2017-10-19: qty 15

## 2017-10-19 NOTE — Progress Notes (Signed)
Subjective: He says he feels much better.  He has no new complaints.  He is coughing productively.  He appears to be over the systemic inflammatory response syndrome.  He is positive for MRSA.  His wife said that he had some bleeding from his hip lesion where he had surgery last week.  His surgery was several weeks ago.  Objective: Vital signs in last 24 hours: Temp:  [98.1 F (36.7 C)-98.6 F (37 C)] 98.6 F (37 C) (11/25 0844) Pulse Rate:  [79-94] 82 (11/25 0844) Resp:  [16-20] 20 (11/25 0844) BP: (117-133)/(47-64) 130/64 (11/25 0844) SpO2:  [77 %-98 %] 96 % (11/25 0845) Weight change:  Last BM Date: 10/17/17  Intake/Output from previous day: 11/24 0701 - 11/25 0700 In: 7217.1 [P.O.:1440; I.V.:4727.1; IV Piggyback:1050] Out: 1300 [Urine:1300]  PHYSICAL EXAM General appearance: alert, cooperative and no distress Resp: rhonchi Bilaterally right more than left Cardio: regular rate and rhythm, S1, S2 normal, no murmur, click, rub or gallop GI: soft, non-tender; bowel sounds normal; no masses,  no organomegaly Extremities: extremities normal, atraumatic, no cyanosis or edema No confusion  Lab Results:  Results for orders placed or performed during the hospital encounter of 10/17/17 (from the past 48 hour(s))  Glucose, capillary     Status: Abnormal   Collection Time: 10/17/17 11:46 AM  Result Value Ref Range   Glucose-Capillary 287 (H) 65 - 99 mg/dL  Troponin I     Status: Abnormal   Collection Time: 10/17/17 11:53 AM  Result Value Ref Range   Troponin I 0.05 (HH) <0.03 ng/mL    Comment: CRITICAL RESULT CALLED TO, READ BACK BY AND VERIFIED WITH: HOBBS,A @ 1305 ON 11.23.18 BY BOWMAN,L   Lactic acid, plasma     Status: Abnormal   Collection Time: 10/17/17 11:53 AM  Result Value Ref Range   Lactic Acid, Venous 2.4 (HH) 0.5 - 1.9 mmol/L    Comment: CRITICAL RESULT CALLED TO, READ BACK BY AND VERIFIED WITH: HOBBS,A @ 1256 ON 11.23.18 BY BOWMAN,L   Lactic acid, plasma      Status: Abnormal   Collection Time: 10/17/17  2:17 PM  Result Value Ref Range   Lactic Acid, Venous 2.5 (HH) 0.5 - 1.9 mmol/L    Comment: CRITICAL RESULT CALLED TO, READ BACK BY AND VERIFIED WITH: HOBBS,A ON 10/17/17 AT 1535 BY LOY,C   Glucose, capillary     Status: Abnormal   Collection Time: 10/17/17  4:13 PM  Result Value Ref Range   Glucose-Capillary 215 (H) 65 - 99 mg/dL  Lactic acid, plasma     Status: None   Collection Time: 10/17/17  6:59 PM  Result Value Ref Range   Lactic Acid, Venous 1.6 0.5 - 1.9 mmol/L  Glucose, capillary     Status: Abnormal   Collection Time: 10/17/17  9:28 PM  Result Value Ref Range   Glucose-Capillary 236 (H) 65 - 99 mg/dL  Urinalysis, Routine w reflex microscopic     Status: Abnormal   Collection Time: 10/18/17  2:10 AM  Result Value Ref Range   Color, Urine YELLOW YELLOW   APPearance HAZY (A) CLEAR   Specific Gravity, Urine 1.014 1.005 - 1.030   pH 5.0 5.0 - 8.0   Glucose, UA >=500 (A) NEGATIVE mg/dL   Hgb urine dipstick NEGATIVE NEGATIVE   Bilirubin Urine NEGATIVE NEGATIVE   Ketones, ur NEGATIVE NEGATIVE mg/dL   Protein, ur 30 (A) NEGATIVE mg/dL   Nitrite NEGATIVE NEGATIVE   Leukocytes, UA NEGATIVE NEGATIVE  RBC / HPF 0-5 0 - 5 RBC/hpf   WBC, UA 0-5 0 - 5 WBC/hpf   Bacteria, UA NONE SEEN NONE SEEN   Squamous Epithelial / LPF 0-5 (A) NONE SEEN   Mucus PRESENT   Basic metabolic panel     Status: Abnormal   Collection Time: 10/18/17  7:09 AM  Result Value Ref Range   Sodium 133 (L) 135 - 145 mmol/L   Potassium 4.4 3.5 - 5.1 mmol/L   Chloride 100 (L) 101 - 111 mmol/L   CO2 26 22 - 32 mmol/L   Glucose, Bld 249 (H) 65 - 99 mg/dL   BUN 24 (H) 6 - 20 mg/dL   Creatinine, Ser 1.00 0.61 - 1.24 mg/dL   Calcium 8.5 (L) 8.9 - 10.3 mg/dL   GFR calc non Af Amer >60 >60 mL/min   GFR calc Af Amer >60 >60 mL/min    Comment: (NOTE) The eGFR has been calculated using the CKD EPI equation. This calculation has not been validated in all clinical  situations. eGFR's persistently <60 mL/min signify possible Chronic Kidney Disease.    Anion gap 7 5 - 15  CBC     Status: Abnormal   Collection Time: 10/18/17  7:09 AM  Result Value Ref Range   WBC 7.1 4.0 - 10.5 K/uL   RBC 3.16 (L) 4.22 - 5.81 MIL/uL   Hemoglobin 9.7 (L) 13.0 - 17.0 g/dL   HCT 30.9 (L) 39.0 - 52.0 %   MCV 97.8 78.0 - 100.0 fL   MCH 30.7 26.0 - 34.0 pg   MCHC 31.4 30.0 - 36.0 g/dL   RDW 15.0 11.5 - 15.5 %   Platelets 207 150 - 400 K/uL  Glucose, capillary     Status: Abnormal   Collection Time: 10/18/17  7:46 AM  Result Value Ref Range   Glucose-Capillary 256 (H) 65 - 99 mg/dL  Glucose, capillary     Status: Abnormal   Collection Time: 10/18/17 11:17 AM  Result Value Ref Range   Glucose-Capillary 239 (H) 65 - 99 mg/dL  Glucose, capillary     Status: Abnormal   Collection Time: 10/18/17  4:18 PM  Result Value Ref Range   Glucose-Capillary 190 (H) 65 - 99 mg/dL  MRSA PCR Screening     Status: Abnormal   Collection Time: 10/18/17  8:02 PM  Result Value Ref Range   MRSA by PCR POSITIVE (A) NEGATIVE    Comment:        The GeneXpert MRSA Assay (FDA approved for NASAL specimens only), is one component of a comprehensive MRSA colonization surveillance program. It is not intended to diagnose MRSA infection nor to guide or monitor treatment for MRSA infections. RESULT CALLED TO, READ BACK BY AND VERIFIED WITH:  HARRIS,B @ 2340 ON 10/18/17 BY JUW   Glucose, capillary     Status: Abnormal   Collection Time: 10/18/17  8:07 PM  Result Value Ref Range   Glucose-Capillary 434 (H) 65 - 99 mg/dL  Glucose, capillary     Status: Abnormal   Collection Time: 10/19/17  8:10 AM  Result Value Ref Range   Glucose-Capillary 263 (H) 65 - 99 mg/dL    ABGS No results for input(s): PHART, PO2ART, TCO2, HCO3 in the last 72 hours.  Invalid input(s): PCO2 CULTURES Recent Results (from the past 240 hour(s))  Culture, Urine     Status: Abnormal   Collection Time: 10/14/17  10:00 PM  Result Value Ref Range Status   Specimen Description  URINE, CLEAN CATCH  Final   Special Requests NONE  Final   Culture (A)  Final    <10,000 COLONIES/mL INSIGNIFICANT GROWTH Performed at [facility], [place] [address] [place], [place] [address]    Report Status 10/16/2017 FINAL  Final  Culture, Urine     Status: Abnormal   Collection Time: 10/15/17 12:00 PM  Result Value Ref Range Status   Specimen Description URINE, CLEAN CATCH  Final   Special Requests NONE  Final   Culture (A)  Final    <10,000 COLONIES/mL INSIGNIFICANT GROWTH Performed at [facility], [place] [address] [place],  [address]    Report Status 10/17/2017 FINAL  Final  MRSA PCR Screening     Status: Abnormal   Collection Time: 10/18/17  8:02 PM  Result Value Ref Range Status   MRSA by PCR POSITIVE (A) NEGATIVE Final    Comment:        The GeneXpert MRSA Assay (FDA approved for NASAL specimens only), is one component of a comprehensive MRSA colonization surveillance program. It is not intended to diagnose MRSA infection nor to guide or monitor treatment for MRSA infections. RESULT CALLED TO, READ BACK BY AND VERIFIED WITH:  HARRIS,B @ 2340 ON 10/18/17 BY JUW    Studies/Results: No results found.  Medications:  Prior to Admission:  Medications Prior to Admission  Medication Sig Dispense Refill Last Dose  . aspirin EC 81 MG tablet Take 81 mg by mouth 2 (two) times daily.   10/16/2017 at Unknown time  . Balsam [place]-Castor Oil (VENELEX) OINT Apply to sacrum and bilateral buttocks q shift and prn erythema. Every shift   10/17/2017 at Unknown time  . cholecalciferol (VITAMIN D) 1000 units tablet Take 5,000 Units by mouth daily.   10/16/2017 at Unknown time  . gabapentin (NEURONTIN) 100 MG capsule Take 200 mg every morning by mouth.   10/16/2017 at Unknown time  . gabapentin (NEURONTIN) 300 MG capsule Take 1 capsule (300 mg total) by mouth at bedtime.   10/16/2017 at Unknown time  .  insulin aspart (NOVOLOG FLEXPEN) 100 UNIT/ML FlexPen Inject into the skin 3 (three) times daily with meals. sliding scale   10/17/2017 at Unknown time  . ipratropium-albuterol (DUONEB) 0.5-2.5 (3) MG/3ML SOLN Take 3 mLs by nebulization every 6 (six) hours as needed.   Taking  . magnesium gluconate (MAGONATE) 500 MG tablet Take 500 mg by mouth daily.   10/16/2017 at Unknown time  . Melatonin 3 MG TABS Take 3 mg by mouth at bedtime.   10/16/2017 at Unknown time  . metFORMIN (GLUCOPHAGE) 1000 MG tablet Take 1 tablet (1,000 mg total) by mouth 2 (two) times daily with a meal. 180 tablet 0 10/16/2017 at Unknown time  . methocarbamol (ROBAXIN) 500 MG tablet Take 1 tablet (500 mg total) by mouth every 6 (six) hours as needed for muscle spasms.   Past Week at Unknown time  . metoprolol tartrate (LOPRESSOR) 50 MG tablet Take 50 mg by mouth 2 (two) times daily.    10/16/2017 at 2040  . Multiple Vitamin (MULTIVITAMIN WITH MINERALS) TABS tablet Take 1 tablet by mouth daily.   10/16/2017 at Unknown time  . ondansetron (ZOFRAN) 4 MG tablet Take 4 mg by mouth every 6 (six) hours as needed for nausea or vomiting.   Past Month at Unknown time  . Phenyleph-CPM-DM-Aspirin (ALKA-SELTZER PLUS COLD & COUGH PO) Take 2 tablets by mouth daily as needed (nasal congestion).   Taking  .  polyethylene glycol (MIRALAX / GLYCOLAX) packet Take 17 g by mouth daily. 14 each 0 10/16/2017 at Unknown time  . simvastatin (ZOCOR) 40 MG tablet Take 40 mg by mouth daily.   10/16/2017 at Unknown time  . tamsulosin (FLOMAX) 0.4 MG CAPS capsule Take 0.4 mg by mouth.   10/16/2017 at Unknown time  . trolamine salicylate (ASPERCREME) 10 % cream Apply 1 application topically as needed for muscle pain.   Taking  . Misc Natural Products (OSTEO BI-FLEX ADV TRIPLE ST PO) Take 1 tablet by mouth once a day   Taking   Scheduled: . aspirin EC  81 mg Oral BID  . Chlorhexidine Gluconate Cloth  6 each Topical Q0600  . cholecalciferol  5,000 Units Oral Daily   . enoxaparin (LOVENOX) injection  40 mg Subcutaneous Q24H  . gabapentin  200 mg Oral q morning - 10a  . gabapentin  300 mg Oral QHS  . guaiFENesin  1,200 mg Oral BID  . insulin aspart  0-9 Units Subcutaneous TID WC  . insulin glargine  10 Units Subcutaneous Q2200  . magnesium oxide  400 mg Oral Daily  . metoprolol tartrate  50 mg Oral BID  . multivitamin with minerals  1 tablet Oral Daily  . mupirocin ointment  1 application Nasal BID  . mupirocin ointment   Topical BID  . polyethylene glycol  17 g Oral Daily  . simvastatin  40 mg Oral q1800  . tamsulosin  0.4 mg Oral QPC breakfast   Continuous: . sodium chloride 125 mL/hr at 10/19/17 0424  . azithromycin Stopped (10/18/17 1508)  . ceFEPime (MAXIPIME) IV Stopped (10/19/17 0450)  . vancomycin Stopped (10/19/17 0549)   WIO:[id number], methocarbamol, sodium chloride  Assesment: He has healthcare associated pneumonia and is getting better.  He had an infectious systemic inflammatory response syndrome and that is improving.  He has COPD at baseline and he is still short of breath.  He is coughing up sputum.  His surgical site looks okay but he still has a scab on it.  I do not see any evidence of infection. Active Problems:   Essential hypertension, benign   COPD (chronic obstructive pulmonary disease) (HCC)   Type 2 diabetes mellitus (HCC)   Coronary artery disease   Hyponatremia   Acute kidney injury (HCC)   S/P Intramedullary implant right femur   Healthcare-associated pneumonia   HCAP (healthcare-associated pneumonia)   Infectious systemic inflammatory response syndrome (HCC)    Plan: Continue current antibiotics.  Place Bactroban on the surgical site.  Add flutter valve.  Add Mucinex.    LOS: 2 days   Coulter Oldaker L 10/19/2017, 10:08 AM

## 2017-10-20 LAB — GLUCOSE, CAPILLARY
GLUCOSE-CAPILLARY: 195 mg/dL — AB (ref 65–99)
GLUCOSE-CAPILLARY: 275 mg/dL — AB (ref 65–99)
Glucose-Capillary: 167 mg/dL — ABNORMAL HIGH (ref 65–99)
Glucose-Capillary: 201 mg/dL — ABNORMAL HIGH (ref 65–99)
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
## 2017-10-20 NOTE — Progress Notes (Signed)
Pharmacy Antibiotic Note  Joshua Fletcher is a 81 y.o. male admitted on 10/17/2017 with pneumonia.  Pharmacy has been consulted for Vancomycin and Cefepime dosing.  Pt is also on Zithromax for atypicals, D/W Dr Daleen Bo.   Plan:  Vancomycin 1000mg  IV q12h Check trough at steady state Cefepime 1gm IV q8h Zithromax 500mg  IV q24hrs per MD Monitor labs, renal fxn, progress and c/s Deescalate ABX when improved / appropriate.    Antimicrobials this admission: Vancomycin 11/23 >>  Cefepime 11/23 >>  Zithromax 11/23 >>  Height: 6\' 2"  (188 cm) Weight: 173 lb (78.5 kg) IBW/kg (Calculated) : 82.2  Temp (24hrs), Avg:98.4 F (36.9 C), Min:98.3 F (36.8 C), Max:98.5 F (36.9 C)  Recent Labs  Lab 10/17/17 0900 10/17/17 0922 10/17/17 1153 10/17/17 1417 10/17/17 1859 10/18/17 0709  WBC 8.2  --   --   --   --  7.1  CREATININE 1.21  --   --   --   --  1.00  LATICACIDVEN  --  1.93* 2.4* 2.5* 1.6  --     Estimated Creatinine Clearance: 66.5 mL/min (by C-G formula based on SCr of 1 mg/dL).  No Known Allergies  Dose adjustments this admission:  Microbiology results:  UCx: insign growth  Sputum:    MRSA PCR: (+)  Thank you for allowing pharmacy to be a part of this patient's care.   Excell Seltzer Poteet 10/20/2017 10:22 AM

## 2017-10-20 NOTE — Progress Notes (Signed)
Subjective: He says he feels better.  He is still coughing productively.  He has no other new complaints.  He is still weak.  Objective: Vital signs in last 24 hours: Temp:  [98.3 F (36.8 C)-98.6 F (37 C)] 98.5 F (36.9 C) (11/26 0429) Pulse Rate:  [80-82] 81 (11/26 0429) Resp:  [18-20] 18 (11/26 0429) BP: (127-132)/(59-64) 132/59 (11/26 0429) SpO2:  [77 %-96 %] 95 % (11/26 0429) Weight change:  Last BM Date: 10/19/17  Intake/Output from previous day: 11/25 0701 - 11/26 0700 In: 2367.1 [P.O.:240; I.V.:1177.1; IV Piggyback:950] Out: 2275 [Urine:2275]  PHYSICAL EXAM General appearance: alert, cooperative and mild distress Resp: Rhonchi and rales on the right the left lung is clear Cardio: regular rate and rhythm, S1, S2 normal, no murmur, click, rub or gallop GI: soft, non-tender; bowel sounds normal; no masses,  no organomegaly Extremities: extremities normal, atraumatic, no cyanosis or edema Skin warm and dry  Lab Results:  Results for orders placed or performed during the hospital encounter of 10/17/17 (from the past 48 hour(s))  Glucose, capillary     Status: Abnormal   Collection Time: 10/18/17 11:17 AM  Result Value Ref Range   Glucose-Capillary 239 (H) 65 - 99 mg/dL  Glucose, capillary     Status: Abnormal   Collection Time: 10/18/17  4:18 PM  Result Value Ref Range   Glucose-Capillary 190 (H) 65 - 99 mg/dL  MRSA PCR Screening     Status: Abnormal   Collection Time: 10/18/17  8:02 PM  Result Value Ref Range   MRSA by PCR POSITIVE (A) NEGATIVE    Comment:        The GeneXpert MRSA Assay (FDA approved for NASAL specimens only), is one component of a comprehensive MRSA colonization surveillance program. It is not intended to diagnose MRSA infection nor to guide or monitor treatment for MRSA infections. RESULT CALLED TO, READ BACK BY AND VERIFIED WITH:  HARRIS,B @ 2340 ON 10/18/17 BY JUW   Glucose, capillary     Status: Abnormal   Collection Time: 10/18/17   8:07 PM  Result Value Ref Range   Glucose-Capillary 434 (H) 65 - 99 mg/dL  Glucose, capillary     Status: Abnormal   Collection Time: 10/19/17  8:10 AM  Result Value Ref Range   Glucose-Capillary 263 (H) 65 - 99 mg/dL  Glucose, capillary     Status: Abnormal   Collection Time: 10/19/17 11:23 AM  Result Value Ref Range   Glucose-Capillary 207 (H) 65 - 99 mg/dL  Glucose, capillary     Status: Abnormal   Collection Time: 10/19/17  3:48 PM  Result Value Ref Range   Glucose-Capillary 196 (H) 65 - 99 mg/dL  Glucose, capillary     Status: Abnormal   Collection Time: 10/19/17  9:39 PM  Result Value Ref Range   Glucose-Capillary 166 (H) 65 - 99 mg/dL   Comment 1 Notify RN   Glucose, capillary     Status: Abnormal   Collection Time: 10/20/17  7:48 AM  Result Value Ref Range   Glucose-Capillary 195 (H) 65 - 99 mg/dL   Comment 1 Notify RN    Comment 2 Document in Chart     ABGS No results for input(s): PHART, PO2ART, TCO2, HCO3 in the last 72 hours.  Invalid input(s): PCO2 CULTURES Recent Results (from the past 240 hour(s))  Culture, Urine     Status: Abnormal   Collection Time: 10/14/17 10:00 PM  Result Value Ref Range Status   Specimen  Description URINE, CLEAN CATCH  Final   Special Requests NONE  Final   Culture (A)  Final    <10,000 COLONIES/mL INSIGNIFICANT GROWTH Performed at [facility], [place] [address], [place], [place] [address]    Report Status 10/16/2017 FINAL  Final  Culture, Urine     Status: Abnormal   Collection Time: 10/15/17 12:00 PM  Result Value Ref Range Status   Specimen Description URINE, CLEAN CATCH  Final   Special Requests NONE  Final   Culture (A)  Final    <10,000 COLONIES/mL INSIGNIFICANT GROWTH Performed at [facility], [place] [address] [place], [place] [address]    Report Status 10/17/2017 FINAL  Final  MRSA PCR Screening     Status: Abnormal   Collection Time: 10/18/17  8:02 PM  Result Value Ref Range Status   MRSA by PCR  POSITIVE (A) NEGATIVE Final    Comment:        The GeneXpert MRSA Assay (FDA approved for NASAL specimens only), is one component of a comprehensive MRSA colonization surveillance program. It is not intended to diagnose MRSA infection nor to guide or monitor treatment for MRSA infections. RESULT CALLED TO, READ BACK BY AND VERIFIED WITH:  HARRIS,B @ 2340 ON 10/18/17 BY JUW    Studies/Results: No results found.  Medications:  Prior to Admission:  Medications Prior to Admission  Medication Sig Dispense Refill Last Dose  . aspirin EC 81 MG tablet Take 81 mg by mouth 2 (two) times daily.   10/16/2017 at Unknown time  . Balsam [place]-Castor Oil (VENELEX) OINT Apply to sacrum and bilateral buttocks q shift and prn erythema. Every shift   10/17/2017 at Unknown time  . cholecalciferol (VITAMIN D) 1000 units tablet Take 5,000 Units by mouth daily.   10/16/2017 at Unknown time  . gabapentin (NEURONTIN) 100 MG capsule Take 200 mg every morning by mouth.   10/16/2017 at Unknown time  . gabapentin (NEURONTIN) 300 MG capsule Take 1 capsule (300 mg total) by mouth at bedtime.   10/16/2017 at Unknown time  . insulin aspart (NOVOLOG FLEXPEN) 100 UNIT/ML FlexPen Inject into the skin 3 (three) times daily with meals. sliding scale   10/17/2017 at Unknown time  . ipratropium-albuterol (DUONEB) 0.5-2.5 (3) MG/3ML SOLN Take 3 mLs by nebulization every 6 (six) hours as needed.   Taking  . magnesium gluconate (MAGONATE) 500 MG tablet Take 500 mg by mouth daily.   10/16/2017 at Unknown time  . Melatonin 3 MG TABS Take 3 mg by mouth at bedtime.   10/16/2017 at Unknown time  . metFORMIN (GLUCOPHAGE) 1000 MG tablet Take 1 tablet (1,000 mg total) by mouth 2 (two) times daily with a meal. 180 tablet 0 10/16/2017 at Unknown time  . methocarbamol (ROBAXIN) 500 MG tablet Take 1 tablet (500 mg total) by mouth every 6 (six) hours as needed for muscle spasms.   Past Week at Unknown time  . metoprolol tartrate (LOPRESSOR)  50 MG tablet Take 50 mg by mouth 2 (two) times daily.    10/16/2017 at 2040  . Multiple Vitamin (MULTIVITAMIN WITH MINERALS) TABS tablet Take 1 tablet by mouth daily.   10/16/2017 at Unknown time  . ondansetron (ZOFRAN) 4 MG tablet Take 4 mg by mouth every 6 (six) hours as needed for nausea or vomiting.   Past Month at Unknown time  . Phenyleph-CPM-DM-Aspirin (ALKA-SELTZER PLUS COLD & COUGH PO) Take 2 tablets by mouth daily as needed (nasal congestion).   Taking  .  polyethylene glycol (MIRALAX / GLYCOLAX) packet Take 17 g by mouth daily. 14 each 0 10/16/2017 at Unknown time  . simvastatin (ZOCOR) 40 MG tablet Take 40 mg by mouth daily.   10/16/2017 at Unknown time  . tamsulosin (FLOMAX) 0.4 MG CAPS capsule Take 0.4 mg by mouth.   10/16/2017 at Unknown time  . trolamine salicylate (ASPERCREME) 10 % cream Apply 1 application topically as needed for muscle pain.   Taking  . Misc Natural Products (OSTEO BI-FLEX ADV TRIPLE ST PO) Take 1 tablet by mouth once a day   Taking   Scheduled: . aspirin EC  81 mg Oral BID  . Chlorhexidine Gluconate Cloth  6 each Topical Q0600  . cholecalciferol  5,000 Units Oral Daily  . enoxaparin (LOVENOX) injection  40 mg Subcutaneous Q24H  . gabapentin  200 mg Oral q morning - 10a  . gabapentin  300 mg Oral QHS  . guaiFENesin  1,200 mg Oral BID  . insulin aspart  0-9 Units Subcutaneous TID WC  . insulin glargine  10 Units Subcutaneous Q2200  . magnesium oxide  400 mg Oral Daily  . metoprolol tartrate  50 mg Oral BID  . multivitamin with minerals  1 tablet Oral Daily  . mupirocin ointment  1 application Nasal BID  . mupirocin ointment   Topical BID  . polyethylene glycol  17 g Oral Daily  . simvastatin  40 mg Oral q1800  . tamsulosin  0.4 mg Oral QPC breakfast   Continuous: . azithromycin Stopped (10/19/17 1604)  . ceFEPime (MAXIPIME) IV Stopped (10/20/17 0537)  . vancomycin Stopped (10/20/17 1937)   TKW:[id number], ipratropium-albuterol, methocarbamol,  polyvinyl alcohol, sodium chloride  Assesment: He was admitted with pneumonia.  This is healthcare associated pneumonia as he came from a skilled care facility and he has improved.  He is approaching baseline.  He has COPD at baseline and that is being treated.  He had a infectious systemic inflammatory response syndrome which is improved  He had acute kidney injury which is improved  He has diabetes which is fairly well controlled  He had hyponatremia which has resolved  He has coronary disease at baseline but no chest pain and this is stable Active Problems:   Essential hypertension, benign   COPD (chronic obstructive pulmonary disease) (HCC)   Type 2 diabetes mellitus (HCC)   Coronary artery disease   Hyponatremia   Acute kidney injury ([place])   S/P Intramedullary implant right femur   Healthcare-associated pneumonia   HCAP (healthcare-associated pneumonia)   Infectious systemic inflammatory response syndrome (HCC)    Plan: Continue IV antibiotics today .  Probable transfer back to skilled care facility tomorrow.  PT consult today    LOS: 3 days   Jacoby Ritsema L 10/20/2017, 8:23 AM
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
## 2017-10-21 ENCOUNTER — Inpatient Hospital Stay
Admission: RE | Admit: 2017-10-21 | Discharge: 2017-12-08 | Disposition: A | Discharge status: Home / Self Care | Payer: Medicare HMO | Source: Ambulatory Visit | Attending: Internal Medicine | Admitting: Internal Medicine

## 2017-10-21 DIAGNOSIS — R221 Localized swelling, mass and lump, neck: Secondary | ICD-10-CM | POA: Diagnosis not present

## 2017-10-21 DIAGNOSIS — R05 Cough: Secondary | ICD-10-CM | POA: Diagnosis not present

## 2017-10-21 DIAGNOSIS — J328 Other chronic sinusitis: Secondary | ICD-10-CM | POA: Diagnosis present

## 2017-10-21 DIAGNOSIS — S7221XD Displaced subtrochanteric fracture of right femur, subsequent encounter for closed fracture with routine healing: Secondary | ICD-10-CM | POA: Diagnosis not present

## 2017-10-21 DIAGNOSIS — I10 Essential (primary) hypertension: Secondary | ICD-10-CM | POA: Diagnosis not present

## 2017-10-21 DIAGNOSIS — E119 Type 2 diabetes mellitus without complications: Secondary | ICD-10-CM | POA: Diagnosis not present

## 2017-10-21 DIAGNOSIS — Z967 Presence of other bone and tendon implants: Secondary | ICD-10-CM | POA: Diagnosis not present

## 2017-10-21 DIAGNOSIS — R262 Difficulty in walking, not elsewhere classified: Secondary | ICD-10-CM | POA: Diagnosis not present

## 2017-10-21 DIAGNOSIS — R0989 Other specified symptoms and signs involving the circulatory and respiratory systems: Secondary | ICD-10-CM | POA: Diagnosis not present

## 2017-10-21 DIAGNOSIS — R590 Localized enlarged lymph nodes: Secondary | ICD-10-CM | POA: Diagnosis not present

## 2017-10-21 DIAGNOSIS — J42 Unspecified chronic bronchitis: Secondary | ICD-10-CM | POA: Diagnosis not present

## 2017-10-21 DIAGNOSIS — J449 Chronic obstructive pulmonary disease, unspecified: Secondary | ICD-10-CM | POA: Diagnosis not present

## 2017-10-21 DIAGNOSIS — E118 Type 2 diabetes mellitus with unspecified complications: Secondary | ICD-10-CM | POA: Diagnosis not present

## 2017-10-21 DIAGNOSIS — S72141D Displaced intertrochanteric fracture of right femur, subsequent encounter for closed fracture with routine healing: Secondary | ICD-10-CM | POA: Diagnosis not present

## 2017-10-21 DIAGNOSIS — R0981 Nasal congestion: Secondary | ICD-10-CM | POA: Diagnosis not present

## 2017-10-21 DIAGNOSIS — Z4789 Encounter for other orthopedic aftercare: Secondary | ICD-10-CM | POA: Diagnosis not present

## 2017-10-21 DIAGNOSIS — I2510 Atherosclerotic heart disease of native coronary artery without angina pectoris: Secondary | ICD-10-CM | POA: Diagnosis not present

## 2017-10-21 DIAGNOSIS — E1151 Type 2 diabetes mellitus with diabetic peripheral angiopathy without gangrene: Secondary | ICD-10-CM | POA: Diagnosis not present

## 2017-10-21 DIAGNOSIS — D62 Acute posthemorrhagic anemia: Secondary | ICD-10-CM | POA: Diagnosis not present

## 2017-10-21 DIAGNOSIS — I700 Atherosclerosis of aorta: Secondary | ICD-10-CM | POA: Diagnosis not present

## 2017-10-21 DIAGNOSIS — K229 Disease of esophagus, unspecified: Secondary | ICD-10-CM | POA: Diagnosis not present

## 2017-10-21 DIAGNOSIS — Z8781 Personal history of (healed) traumatic fracture: Secondary | ICD-10-CM | POA: Diagnosis not present

## 2017-10-21 DIAGNOSIS — I7789 Other specified disorders of arteries and arterioles: Secondary | ICD-10-CM | POA: Diagnosis not present

## 2017-10-21 DIAGNOSIS — E782 Mixed hyperlipidemia: Secondary | ICD-10-CM | POA: Diagnosis not present

## 2017-10-21 DIAGNOSIS — R21 Rash and other nonspecific skin eruption: Secondary | ICD-10-CM | POA: Diagnosis not present

## 2017-10-21 DIAGNOSIS — J189 Pneumonia, unspecified organism: Secondary | ICD-10-CM | POA: Diagnosis not present

## 2017-10-21 DIAGNOSIS — J439 Emphysema, unspecified: Secondary | ICD-10-CM | POA: Diagnosis not present

## 2017-10-21 DIAGNOSIS — M47892 Other spondylosis, cervical region: Secondary | ICD-10-CM | POA: Diagnosis not present

## 2017-10-21 DIAGNOSIS — E1140 Type 2 diabetes mellitus with diabetic neuropathy, unspecified: Secondary | ICD-10-CM | POA: Diagnosis not present

## 2017-10-21 DIAGNOSIS — E871 Hypo-osmolality and hyponatremia: Secondary | ICD-10-CM | POA: Diagnosis not present

## 2017-10-21 DIAGNOSIS — M6281 Muscle weakness (generalized): Secondary | ICD-10-CM | POA: Diagnosis not present

## 2017-10-21 DIAGNOSIS — D72829 Elevated white blood cell count, unspecified: Secondary | ICD-10-CM | POA: Diagnosis not present

## 2017-10-21 DIAGNOSIS — N179 Acute kidney failure, unspecified: Secondary | ICD-10-CM | POA: Diagnosis not present

## 2017-10-21 LAB — GLUCOSE, CAPILLARY
GLUCOSE-CAPILLARY: 227 mg/dL — AB (ref 65–99)
Glucose-Capillary: 181 mg/dL — ABNORMAL HIGH (ref 65–99)

## 2017-10-21 MED ORDER — GUAIFENESIN ER 600 MG PO TB12: 1200.0000 mg | ORAL_TABLET | Freq: Two times a day (BID) | ORAL | 0 refills | Status: DC

## 2017-10-21 MED ORDER — MUPIROCIN 2 % EX OINT: TOPICAL_OINTMENT | Freq: Two times a day (BID) | CUTANEOUS | 0 refills | Status: DC

## 2017-10-21 MED ORDER — LEVOFLOXACIN 500 MG PO TABS: ORAL_TABLET | ORAL | 0 refills | Status: DC

## 2017-10-21 MED ORDER — HYDROCORTISONE 1 % EX CREA
TOPICAL_CREAM | Freq: Three times a day (TID) | CUTANEOUS | Status: DC
  Administered 2017-10-21 (×2): 1 via TOPICAL
  Filled 2017-10-21 (×2): qty 1.5

## 2017-10-21 NOTE — Progress Notes (Signed)
Patient discharging back to [facility].  Report called and given to [place].  Patient aware and agreeable.  IV removed -- WNL.  AVS printed to send with patient.

## 2017-10-21 NOTE — Care Management Important Message (Signed)
Important Message  Patient Details  Name: Joshua Fletcher MRN: [id number] Date of Birth: [birth year]   Medicare Important Message Given:  Yes    Sherald Barge, RN 10/21/2017, 8:40 AM

## 2017-10-21 NOTE — Care Management Note (Signed)
SW following for dc planing. Pt stable for dc today per MD. Theophilus Bones at [facility]. Per Marianna Fuss, she can see necessary clinical information on the HUB. Met with pt who is agreeable to returning to [facility] today. Requested RN call report to [phone]. There are no other SW needs for dc.

## 2017-10-21 NOTE — Discharge Summary (Signed)
Physician Discharge Summary  Patient ID: Joshua Fletcher MRN: [id number] DOB/AGE: 1936/04/25 81 y.o. Primary Care Physician:Traveion Ruddock, Percell Miller, MD Admit date: 10/17/2017 Discharge date: 10/21/2017    Discharge Diagnoses:   Active Problems:   Essential hypertension, benign   COPD (chronic obstructive pulmonary disease) (HCC)   Type 2 diabetes mellitus (HCC)   Coronary artery disease   Hyponatremia   Acute kidney injury (HCC)   S/P Intramedullary implant right femur   Healthcare-associated pneumonia   HCAP (healthcare-associated pneumonia)   Infectious systemic inflammatory response syndrome (HCC)   Allergies as of 10/21/2017   No Known Allergies     Medication List    TAKE these medications   ALKA-SELTZER PLUS COLD & COUGH PO Take 2 tablets by mouth daily as needed (nasal congestion).   aspirin EC 81 MG tablet Take 81 mg by mouth 2 (two) times daily.   cholecalciferol 1000 units tablet Commonly known as:  VITAMIN D Take 5,000 Units by mouth daily.   gabapentin 100 MG capsule Commonly known as:  NEURONTIN Take 200 mg every morning by mouth.   gabapentin 300 MG capsule Commonly known as:  NEURONTIN Take 1 capsule (300 mg total) by mouth at bedtime.   guaiFENesin 600 MG 12 hr tablet Commonly known as:  MUCINEX Take 2 tablets (1,200 mg total) by mouth 2 (two) times daily.   ipratropium-albuterol 0.5-2.5 (3) MG/3ML Soln Commonly known as:  DUONEB Take 3 mLs by nebulization every 6 (six) hours as needed.   levofloxacin 500 MG tablet Commonly known as:  LEVAQUIN 1 daily for 7 days   magnesium gluconate 500 MG tablet Commonly known as:  MAGONATE Take 500 mg by mouth daily.   Melatonin 3 MG Tabs Take 3 mg by mouth at bedtime.   metFORMIN 1000 MG tablet Commonly known as:  GLUCOPHAGE Take 1 tablet (1,000 mg total) by mouth 2 (two) times daily with a meal.   methocarbamol 500 MG tablet Commonly known as:  ROBAXIN Take 1 tablet (500 mg total) by mouth every 6  (six) hours as needed for muscle spasms.   metoprolol tartrate 50 MG tablet Commonly known as:  LOPRESSOR Take 50 mg by mouth 2 (two) times daily.   multivitamin with minerals Tabs tablet Take 1 tablet by mouth daily.   mupirocin ointment 2 % Commonly known as:  BACTROBAN Apply topically 2 (two) times daily.   NOVOLOG FLEXPEN 100 UNIT/ML FlexPen Generic drug:  insulin aspart Inject into the skin 3 (three) times daily with meals. sliding scale   ondansetron 4 MG tablet Commonly known as:  ZOFRAN Take 4 mg by mouth every 6 (six) hours as needed for nausea or vomiting.   OSTEO BI-FLEX ADV TRIPLE ST PO Take 1 tablet by mouth once a day   polyethylene glycol packet Commonly known as:  MIRALAX / GLYCOLAX Take 17 g by mouth daily.   simvastatin 40 MG tablet Commonly known as:  ZOCOR Take 40 mg by mouth daily.   tamsulosin 0.4 MG Caps capsule Commonly known as:  FLOMAX Take 0.4 mg by mouth.   trolamine salicylate 10 % cream Commonly known as:  ASPERCREME Apply 1 application topically as needed for muscle pain.   VENELEX Oint Apply to sacrum and bilateral buttocks q shift and prn erythema. Every shift       Discharged Condition: Improved    Consults: None  Significant Diagnostic Studies: Dg Chest 2 View  Result Date: 10/17/2017 CLINICAL DATA:  Hypoxia.  Cough.  COPD. EXAM: CHEST  2 VIEW COMPARISON:  One-view chest x-ray 08/22/2017. Two-view chest x-ray 07/22/2011. FINDINGS: The heart size is normal. Pulmonary vascular congestion or mild edema superimposed on chronic changes of COPD. The right upper lobe airspace opacity is concerning for pneumonia. There are no definite effusions. Atherosclerotic calcifications are again seen at the aorta. Postoperative changes are present in the cervical spine. IMPRESSION: 1. Right upper lobe pneumonia. 2. Mild pulmonary vascular congestion or edema superimposed on chronic changes of COPD. 3. Aortic atherosclerosis. Electronically  Signed   By: San Morelle M.D.   On: 10/17/2017 09:28    Lab Results: Basic Metabolic Panel: No results for input(s): NA, K, CL, CO2, GLUCOSE, BUN, CREATININE, CALCIUM, MG, PHOS in the last 72 hours. Liver Function Tests: No results for input(s): AST, ALT, ALKPHOS, BILITOT, PROT, ALBUMIN in the last 72 hours.   CBC: No results for input(s): WBC, NEUTROABS, HGB, HCT, MCV, PLT in the last 72 hours.  Recent Results (from the past 240 hour(s))  Culture, Urine     Status: Abnormal   Collection Time: 10/14/17 10:00 PM  Result Value Ref Range Status   Specimen Description URINE, CLEAN CATCH  Final   Special Requests NONE  Final   Culture (A)  Final    <10,000 COLONIES/mL INSIGNIFICANT GROWTH Performed at [facility], [address], [place], [place] [address]    Report Status 10/16/2017 FINAL  Final  Culture, Urine     Status: Abnormal   Collection Time: 10/15/17 12:00 PM  Result Value Ref Range Status   Specimen Description URINE, CLEAN CATCH  Final   Special Requests NONE  Final   Culture (A)  Final    <10,000 COLONIES/mL INSIGNIFICANT GROWTH Performed at [facility], [place] [address] [place], [place] [address]    Report Status 10/17/2017 FINAL  Final  MRSA PCR Screening     Status: Abnormal   Collection Time: 10/18/17  8:02 PM  Result Value Ref Range Status   MRSA by PCR POSITIVE (A) NEGATIVE Final    Comment:        The GeneXpert MRSA Assay (FDA approved for NASAL specimens only), is one component of a comprehensive MRSA colonization surveillance program. It is not intended to diagnose MRSA infection nor to guide or monitor treatment for MRSA infections. RESULT CALLED TO, READ BACK BY AND VERIFIED WITH:  HARRIS,B @ 2340 ON 10/18/17 [facility] Course: This is a 81 year old who has been a resident at a skilled care facility undergoing rehabilitation for a right hip fracture.  He has been making progress with his rehab but then developed  cough and congestion.  He was treated initially at the rehab facility but then became febrile and was sent to the emergency department.  In the emergency department he was noted to have right upper lobe pneumonia.  At baseline he has COPD.  He was hypoxic and had systemic inflammatory response syndrome.  He had acute kidney injury.  He was treated with IV fluids IV antibiotics inhaled bronchodilators and oxygen and improved.  By the time of discharge she was markedly better still congested still requiring oxygen and had no evidence of systemic inflammatory response syndrome.  Discharge Exam: Blood pressure (!) 110/53, pulse 91, temperature 98.2 F (36.8 C), temperature source Oral, resp. rate 18, height 6\' 2"  (1.88 m), weight 78.5 kg (173 lb), SpO2 92 %. He is awake and alert.  He has rhonchi on the right more than the  left on his chest examination.  His heart is regular.  Surgical site is well healed with a scab.  Disposition: To skilled care facility.  He will take Levaquin 500 mg daily for 7 days.  He will take guaifenesin 1200 mg twice a day for 10 days.  He will need to be on oxygen at 2 L which can be adjusted to maintain oxygen saturation greater than 90%.  He needs follow-up chest x-ray in 1 month to document clearing of the infiltrate  Total time spent on this discharge activity greater than 30 minutes  Discharge Instructions    Diet - low sodium heart healthy   Complete by:  As directed    Increase activity slowly   Complete by:  As directed         Signed: Lechelle Wrigley L   10/21/2017, 8:32 AM

## 2017-10-21 NOTE — Progress Notes (Signed)
Subjective: He says he feels better.  He is coughing and still has some production.  He is much less short of breath.  He has no new complaints.  Objective: Vital signs in last 24 hours: Temp:  [98.2 F (36.8 C)-98.7 F (37.1 C)] 98.2 F (36.8 C) (11/27 0530) Pulse Rate:  [87-91] 91 (11/27 0530) Resp:  [18] 18 (11/27 0530) BP: (110-124)/(53-58) 110/53 (11/27 0530) SpO2:  [72 %-92 %] 92 % (11/27 0532) Weight change:  Last BM Date: 10/19/17  Intake/Output from previous day: 11/26 0701 - 11/27 0700 In: 980 [P.O.:480; IV Piggyback:500] Out: 2100 [Urine:2100]  PHYSICAL EXAM General appearance: alert, cooperative and no distress Resp: Rhonchi right greater than left Cardio: regular rate and rhythm, S1, S2 normal, no murmur, click, rub or gallop GI: soft, non-tender; bowel sounds normal; no masses,  no organomegaly Extremities: The wound on his hip looks okay with no infection but it does have a scab Skin warm and dry otherwise.  Throat is clear  Lab Results:  Results for orders placed or performed during the hospital encounter of 10/17/17 (from the past 48 hour(s))  Glucose, capillary     Status: Abnormal   Collection Time: 10/19/17 11:23 AM  Result Value Ref Range   Glucose-Capillary 207 (H) 65 - 99 mg/dL  Glucose, capillary     Status: Abnormal   Collection Time: 10/19/17  3:48 PM  Result Value Ref Range   Glucose-Capillary 196 (H) 65 - 99 mg/dL  Glucose, capillary     Status: Abnormal   Collection Time: 10/19/17  9:39 PM  Result Value Ref Range   Glucose-Capillary 166 (H) 65 - 99 mg/dL   Comment 1 Notify RN   Glucose, capillary     Status: Abnormal   Collection Time: 10/20/17  7:48 AM  Result Value Ref Range   Glucose-Capillary 195 (H) 65 - 99 mg/dL   Comment 1 Notify RN    Comment 2 Document in Chart   Glucose, capillary     Status: Abnormal   Collection Time: 10/20/17 11:29 AM  Result Value Ref Range   Glucose-Capillary 275 (H) 65 - 99 mg/dL   Comment 1 Notify RN     Comment 2 Document in Chart   Glucose, capillary     Status: Abnormal   Collection Time: 10/20/17  4:28 PM  Result Value Ref Range   Glucose-Capillary 167 (H) 65 - 99 mg/dL   Comment 1 Notify RN    Comment 2 Document in Chart   Glucose, capillary     Status: Abnormal   Collection Time: 10/20/17  9:12 PM  Result Value Ref Range   Glucose-Capillary 201 (H) 65 - 99 mg/dL   Comment 1 Notify RN   Glucose, capillary     Status: Abnormal   Collection Time: 10/21/17  7:24 AM  Result Value Ref Range   Glucose-Capillary 181 (H) 65 - 99 mg/dL    ABGS No results for input(s): PHART, PO2ART, TCO2, HCO3 in the last 72 hours.  Invalid input(s): PCO2 CULTURES Recent Results (from the past 240 hour(s))  Culture, Urine     Status: Abnormal   Collection Time: 10/14/17 10:00 PM  Result Value Ref Range Status   Specimen Description URINE, CLEAN CATCH  Final   Special Requests NONE  Final   Culture (A)  Final    <10,000 COLONIES/mL INSIGNIFICANT GROWTH Performed at [facility], [address] [place], [place] [address]    Report Status 10/16/2017 FINAL  Final  Culture, Urine     Status: Abnormal   Collection Time: 10/15/17 12:00 PM  Result Value Ref Range Status   Specimen Description URINE, CLEAN CATCH  Final   Special Requests NONE  Final   Culture (A)  Final    <10,000 COLONIES/mL INSIGNIFICANT GROWTH Performed at [facility], [address] [place], [place] [address]    Report Status 10/17/2017 FINAL  Final  MRSA PCR Screening     Status: Abnormal   Collection Time: 10/18/17  8:02 PM  Result Value Ref Range Status   MRSA by PCR POSITIVE (A) NEGATIVE Final    Comment:        The GeneXpert MRSA Assay (FDA approved for NASAL specimens only), is one component of a comprehensive MRSA colonization surveillance program. It is not intended to diagnose MRSA infection nor to guide or monitor treatment for MRSA infections. RESULT CALLED TO, READ BACK BY AND VERIFIED  WITH:  HARRIS,B @ 2340 ON 10/18/17 BY JUW    Studies/Results: No results found.  Medications:  Prior to Admission:  Medications Prior to Admission  Medication Sig Dispense Refill Last Dose  . aspirin EC 81 MG tablet Take 81 mg by mouth 2 (two) times daily.   10/16/2017 at Unknown time  . Balsam [place]-Castor Oil (VENELEX) OINT Apply to sacrum and bilateral buttocks q shift and prn erythema. Every shift   10/17/2017 at Unknown time  . cholecalciferol (VITAMIN D) 1000 units tablet Take 5,000 Units by mouth daily.   10/16/2017 at Unknown time  . gabapentin (NEURONTIN) 100 MG capsule Take 200 mg every morning by mouth.   10/16/2017 at Unknown time  . gabapentin (NEURONTIN) 300 MG capsule Take 1 capsule (300 mg total) by mouth at bedtime.   10/16/2017 at Unknown time  . insulin aspart (NOVOLOG FLEXPEN) 100 UNIT/ML FlexPen Inject into the skin 3 (three) times daily with meals. sliding scale   10/17/2017 at Unknown time  . ipratropium-albuterol (DUONEB) 0.5-2.5 (3) MG/3ML SOLN Take 3 mLs by nebulization every 6 (six) hours as needed.   Taking  . magnesium gluconate (MAGONATE) 500 MG tablet Take 500 mg by mouth daily.   10/16/2017 at Unknown time  . Melatonin 3 MG TABS Take 3 mg by mouth at bedtime.   10/16/2017 at Unknown time  . metFORMIN (GLUCOPHAGE) 1000 MG tablet Take 1 tablet (1,000 mg total) by mouth 2 (two) times daily with a meal. 180 tablet 0 10/16/2017 at Unknown time  . methocarbamol (ROBAXIN) 500 MG tablet Take 1 tablet (500 mg total) by mouth every 6 (six) hours as needed for muscle spasms.   Past Week at Unknown time  . metoprolol tartrate (LOPRESSOR) 50 MG tablet Take 50 mg by mouth 2 (two) times daily.    10/16/2017 at 2040  . Multiple Vitamin (MULTIVITAMIN WITH MINERALS) TABS tablet Take 1 tablet by mouth daily.   10/16/2017 at Unknown time  . ondansetron (ZOFRAN) 4 MG tablet Take 4 mg by mouth every 6 (six) hours as needed for nausea or vomiting.   Past Month at Unknown time  .  Phenyleph-CPM-DM-Aspirin (ALKA-SELTZER PLUS COLD & COUGH PO) Take 2 tablets by mouth daily as needed (nasal congestion).   Taking  . polyethylene glycol (MIRALAX / GLYCOLAX) packet Take 17 g by mouth daily. 14 each 0 10/16/2017 at Unknown time  . simvastatin (ZOCOR) 40 MG tablet Take 40 mg by mouth daily.   10/16/2017 at Unknown time  . tamsulosin (FLOMAX) 0.4 MG CAPS capsule Take  0.4 mg by mouth.   10/16/2017 at Unknown time  . trolamine salicylate (ASPERCREME) 10 % cream Apply 1 application topically as needed for muscle pain.   Taking  . Misc Natural Products (OSTEO BI-FLEX ADV TRIPLE ST PO) Take 1 tablet by mouth once a day   Taking   Scheduled: . aspirin EC  81 mg Oral BID  . Chlorhexidine Gluconate Cloth  6 each Topical Q0600  . cholecalciferol  5,000 Units Oral Daily  . enoxaparin (LOVENOX) injection  40 mg Subcutaneous Q24H  . gabapentin  200 mg Oral q morning - 10a  . gabapentin  300 mg Oral QHS  . guaiFENesin  1,200 mg Oral BID  . hydrocortisone cream   Topical TID  . insulin aspart  0-9 Units Subcutaneous TID WC  . insulin glargine  10 Units Subcutaneous Q2200  . magnesium oxide  400 mg Oral Daily  . metoprolol tartrate  50 mg Oral BID  . multivitamin with minerals  1 tablet Oral Daily  . mupirocin ointment  1 application Nasal BID  . mupirocin ointment   Topical BID  . polyethylene glycol  17 g Oral Daily  . simvastatin  40 mg Oral q1800  . tamsulosin  0.4 mg Oral QPC breakfast   Continuous: . azithromycin 500 mg (10/20/17 1734)  . ceFEPime (MAXIPIME) IV Stopped (10/21/17 0557)  . vancomycin Stopped (10/21/17 1610)   RUE:[id number], ipratropium-albuterol, methocarbamol, polyvinyl alcohol, sodium chloride  Assesment: He has healthcare associated pneumonia and he is much better.  He had infectious systemic inflammatory response syndrome and that has resolved.  He had acute kidney injury that has resolved.  At baseline he has COPD and he is requiring oxygen still.  I  think he is ready to go back to the skilled care facility to finish his rehabilitation from his hip fracture. Active Problems:   Essential hypertension, benign   COPD (chronic obstructive pulmonary disease) (HCC)   Type 2 diabetes mellitus (HCC)   Coronary artery disease   Hyponatremia   Acute kidney injury ([place])   S/P Intramedullary implant right femur   Healthcare-associated pneumonia   HCAP (healthcare-associated pneumonia)   Infectious systemic inflammatory response syndrome ([place])    Plan: Transfer to skilled care facility today    LOS: 4 days   Alie Moudy L 10/21/2017, 8:22 AM

## 2017-10-22 ENCOUNTER — Non-Acute Institutional Stay (SKILLED_NURSING_FACILITY): Payer: Medicare HMO | Admitting: Internal Medicine

## 2017-10-22 ENCOUNTER — Encounter: Payer: Self-pay | Admitting: Internal Medicine

## 2017-10-22 DIAGNOSIS — I10 Essential (primary) hypertension: Secondary | ICD-10-CM | POA: Diagnosis not present

## 2017-10-22 DIAGNOSIS — J189 Pneumonia, unspecified organism: Secondary | ICD-10-CM

## 2017-10-22 DIAGNOSIS — D62 Acute posthemorrhagic anemia: Secondary | ICD-10-CM | POA: Diagnosis not present

## 2017-10-22 DIAGNOSIS — Z9889 Other specified postprocedural states: Secondary | ICD-10-CM

## 2017-10-22 DIAGNOSIS — E118 Type 2 diabetes mellitus with unspecified complications: Secondary | ICD-10-CM | POA: Diagnosis not present

## 2017-10-22 DIAGNOSIS — Z967 Presence of other bone and tendon implants: Secondary | ICD-10-CM

## 2017-10-22 DIAGNOSIS — N179 Acute kidney failure, unspecified: Secondary | ICD-10-CM

## 2017-10-22 DIAGNOSIS — Z8781 Personal history of (healed) traumatic fracture: Secondary | ICD-10-CM | POA: Diagnosis not present

## 2017-10-22 NOTE — Progress Notes (Signed)
Location:   [facility] Room Number: 155/P Place of Service:  SNF (31) Provider:  Terisa Starr, MD  Patient Care Team: Sinda Du, MD as PCP - General (Internal Medicine)  Extended Emergency Contact Information Primary Emergency Contact: Moller,Chyrl Address: [address]          [place], [place] [address] [place] Phone: [phone] Mobile Phone: [phone] Relation: Spouse  Code Status:  Full Code Goals of care: Advanced Directive information Advanced Directives 10/22/2017  Does Patient Have a Medical Advance Directive? Yes  Type of Advance Directive (No Data)  Does patient want to make changes to medical advance directive? No - Patient declined  Would patient like information on creating a medical advance directive? No - Patient declined     Chief complaint-acute visit follow-up hospitalization for pneumonia  HPI:  Pt is a 81 y.o. male seen today for follow-up of recent hospitalization secondary to pneumonia  Patient is here for rehab after hospitalization previously for IM nailing of his right femur status post fracture.  He also has a history of type 2 diabetes-coronary artery disease with status post stent placement-CHF-neuropathy-hypertension-hyperlipidemia as well as COPD.  Patient developed cough and congestion which apparently worsened resulting in hospitalization he also was febrile.  He was noted to have a right upper lobe pneumonia complicated with a history of COPD-he did have hypoxia and was thought to have systemic inflammatory response syndrome-as well as acute kidney injury.  He was treated with IV fluids and IV antibiotics as well as inhaled bronchodilators and oxygen and had significant improvement.  He still requires oxygen but apparently no evidence of a systemic inflammatory response syndrome upon discharge.  He continues on Levaquin for an additional week he is also on Mucinex and  continues on oxygen.  Today has no acute complaints she does have a rash most prominently over his lower back and lower abdominal area- he has been started on triamcinolone cream-Per staff this has improved today-he is complaining of some itching.       Past Medical History:  Diagnosis Date  . Arthritis   . Constipation   . COPD (chronic obstructive pulmonary disease) ([facility])   . Coronary artery disease   . Diabetes mellitus   . Hypertension   . Skin cancer of face    Past Surgical History:  Procedure Laterality Date  . APPENDECTOMY  1958  . BACK SURGERY    . CORONARY STENT PLACEMENT    . FEMUR IM NAIL Right 08/23/2017   Procedure: INTRAMEDULLARY (IM) NAIL FEMORAL;  Surgeon: Nicholes Stairs, MD;  Location: [facility];  Service: Orthopedics;  Laterality: Right;  . SKIN CANCER EXCISION  07/2017    No Known Allergies  Outpatient Encounter Medications as of 10/22/2017  Medication Sig  . aspirin EC 81 MG tablet Take 81 mg by mouth 2 (two) times daily.  Roseanne Kaufman Peru-Castor Oil (VENELEX) OINT Apply to sacrum and bilateral buttocks q shift and prn erythema. Every shift  . cholecalciferol (VITAMIN D) 1000 units tablet Take 5,000 Units by mouth daily.  Marland Kitchen gabapentin (NEURONTIN) 100 MG capsule Take 200 mg every morning by mouth.  . gabapentin (NEURONTIN) 300 MG capsule Take 1 capsule (300 mg total) by mouth at bedtime.  Marland Kitchen guaiFENesin (MUCINEX) 600 MG 12 hr tablet Take 2 tablets (1,200 mg total) by mouth 2 (two) times daily.  . insulin aspart (NOVOLOG FLEXPEN) 100 UNIT/ML FlexPen Inject into the skin 3 (three) times daily  with meals. sliding scale  . ipratropium-albuterol (DUONEB) 0.5-2.5 (3) MG/3ML SOLN Take 3 mLs by nebulization every 6 (six) hours as needed.  Marland Kitchen levofloxacin (LEVAQUIN) 500 MG tablet 1 daily for 7 days  . magnesium gluconate (MAGONATE) 500 MG tablet Take 500 mg by mouth daily.  . Melatonin 3 MG TABS Take 3 mg by mouth at bedtime.  . metFORMIN (GLUCOPHAGE) 1000 MG  tablet Take 1 tablet (1,000 mg total) by mouth 2 (two) times daily with a meal.  . methocarbamol (ROBAXIN) 500 MG tablet Take 1 tablet (500 mg total) by mouth every 6 (six) hours as needed for muscle spasms.  . metoprolol tartrate (LOPRESSOR) 50 MG tablet Take 50 mg by mouth 2 (two) times daily.   . Misc Natural Products (OSTEO BI-FLEX ADV TRIPLE ST PO) Take 1 tablet by mouth once a day  . Multiple Vitamin (MULTIVITAMIN WITH MINERALS) TABS tablet Take 1 tablet by mouth daily.  . mupirocin ointment (BACTROBAN) 2 % Apply topically 2 (two) times daily.  Marland Kitchen Phenyleph-CPM-DM-Aspirin (ALKA-SELTZER PLUS COLD & COUGH PO) Take 2 tablets by mouth daily as needed (nasal congestion).  . polyethylene glycol (MIRALAX / GLYCOLAX) packet Take 17 g by mouth daily.  . simvastatin (ZOCOR) 40 MG tablet Take 40 mg by mouth daily.  . tamsulosin (FLOMAX) 0.4 MG CAPS capsule Take 0.4 mg by mouth.  . triamcinolone cream (KENALOG) 0.5 % Apply 1 application topically 3 (three) times daily.  Marland Kitchen trolamine salicylate (ASPERCREME) 10 % cream Apply 1 application topically as needed for muscle pain.  . [DISCONTINUED] ondansetron (ZOFRAN) 4 MG tablet Take 4 mg by mouth every 6 (six) hours as needed for nausea or vomiting.   No facility-administered encounter medications on file as of 10/22/2017.      Review of Systems  In general is not complaining of any fever or chills.  Skin does have a rash as noted above that itches this is more in his lower back and lower abdominal area.  Head ears eyes nose mouth and throat is not complaining of any sore throat or tongue swelling or visual changes.  Respiratory is not complaining of shortness of breath currently is on oxygen cough apparently is improving.  Cardiac is not complaining of any chest pain or edema.  GI does not complain of abdominal discomfort nausea vomiting diarrhea constipation.  GU is not complaining of dysuria.  Musculoskeletal does have weakness but is not  complaining of joint pain at this time he does complain of what he describes as nerve pain.  Neurologic as noted above does come complain of some neuropathic pain-does not complain of syncope dizziness.  Psych does not complain of overt depression or anxiety   Immunization History  Administered Date(s) Administered  . Influenza-Unspecified 08/11/2017  . Pneumococcal Conjugate-13 10/06/2017  . Tdap 10/06/2017   Pertinent  Health Maintenance Due  Topic Date Due  . FOOT EXAM  10/25/2017 (Originally 10/19/1946)  . OPHTHALMOLOGY EXAM  10/25/2017 (Originally 10/19/1946)  . URINE MICROALBUMIN  10/25/2017 (Originally 10/19/1946)  . HEMOGLOBIN A1C  02/09/2018  . PNA vac Low Risk Adult (2 of 2 - PPSV23) 10/06/2018  . INFLUENZA VACCINE  Completed   Fall Risk  02/13/2017 05/06/2016 02/02/2016 10/24/2015  Falls in the past year? No No No No   Functional Status Survey:     Review he is afebrile pulse is 72 respirations 18 blood pressure taken manually 102/62.   Physical Exam   In general this is a pleasant elderly male in no  distress lying comfortably in bed.  His skin is warm and dry.--He does have an erythematous rash most prominently in the right side of his lower back as well as lower abdominal area bilaterally according to nursing this has improved since yesterday-she also has a rash lower legs bilaterally  Eyes sclera and conjunctive are clear visual acuity appears grossly intact.  Oropharynx is clear mucous membranes are moist tongue is midline with no evidence of swelling.  Chest is clear to auscultation with somewhat shallow air entry there is no labored breathing he does have oxygen applied.  Heart is regular rate and rhythm without murmur gallop or rub-he does not have significant lower extremity edema.  Pedal pulses are reduced bilaterally.  Abdomen is soft nontender with positive bowel sounds.  Musculoskeletal is able to move all extremities x4 at baseline upper  extremity strength appears preserved grip strength is strong.  Neurologic is grossly intact his speech is clear no lateralizing findings does have touch sensation lower extremities but feels that is somewhat reduced bilaterally that is not new.  Psych he is alert and oriented pleasant and appropriate  Labs reviewed: Recent Labs    09/30/17 0704 10/09/17 0400 10/17/17 0900 10/18/17 0709  NA 138  --  133* 133*  K 4.5  --  4.5 4.4  CL 100*  --  97* 100*  CO2 29  --  27 26  GLUCOSE 171*  --  212* 249*  BUN 34*  --  36* 24*  CREATININE 0.84  --  1.21 1.00  CALCIUM 9.2  --  9.0 8.5*  MG  --  1.7  --   --    Recent Labs    09/23/17 0630 09/30/17 0704 10/17/17 0900  AST 24 23 21   ALT 10* 12* 14*  ALKPHOS 149* 141* 100  BILITOT 0.9 0.7 0.7  PROT 6.7 7.0 7.2  ALBUMIN 3.3* 3.4* 3.0*   Recent Labs    08/28/17 0615  10/09/17 0400 10/17/17 0900 10/18/17 0709  WBC 9.0   < > 5.5 8.2 7.1  NEUTROABS 5.9  --  3.4 5.6  --   HGB 8.3*   < > 10.9* 11.0* 9.7*  HCT 24.8*   < > 33.4* 34.9* 30.9*  MCV 91.5   < > 96.8 97.8 97.8  PLT 161   < > 160 202 207   < > = values in this interval not displayed.   No results found for: TSH Lab Results  Component Value Date   HGBA1C 6.0 (H) 08/12/2017   Lab Results  Component Value Date   CHOL 110 10/09/2017   HDL 48 10/09/2017   LDLCALC 41 10/09/2017   TRIG 107 10/09/2017   CHOLHDL 2.3 10/09/2017    Significant Diagnostic Results in last 30 days:  No results found.  Assessment/Plan  #1-history of pneumonia-this was treated the hospital for IV antibiotics as well as bronchodilators and oxygen- he has been discharged on a one-week course of Levaquin is also on Mucinex times 10 days continues on oxygen will attempt to wean him off this-he also has duo nebs as needed. [employer] is a  Clinically appears to be stable with but he would like to get off the oxygen.  2.  History of right femur fracture status post repair he appears stable in this  regard he will need continued therapy continues on low-dose aspirin for DVT prophylaxis as Robaxin as needed-.  3.-Diabetes type 2 he is on Glucophage CBG so far been in the  mid 100s hemoglobin A1c was 6 on lab done in September we will continue to monitor is also on NovoLog sliding scale as needed.  4.  Hypertension previously  hadlow pressure was reduced secondary to hypotensive readings blood pressure today manually was [id number] systolic was 378 at this point will monitor he does not appear to complain of any syncope or dizziness.  5.-History of anemia thought to be postop from surgery he is on iron most recent hemoglobin 9.7 appeared to be down a bit from 11 although I think your  could be from hemoconcentration--hemoglobin has risen up from 8.6 postop-will update this tomorrow.  6.  History of neuropathy he is on Neurontin twice daily and will monitor this as well-she still continues of some neuropathic pain occasionally.  7.  History of insomnia he was recently started on melatonin will monitor for now.  8.  History of low magnesium he is on supplementation magnesium level was 1.7 on lab done October 09, 2017.  9.  History of hyperlipidemia he continues on a statin LDL was 26 on labs done on November 15 as well.  10.  History of renal insufficiency creatinine at discharge appeared to be back nearing his baseline at 1.0 will update this as well later this week.  11.  History of rash could be secondary to IV antibiotics received in hospital according to nursing staff this is looking better ---he has been started on triamcinolone cream this will have to be watched.  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  [id number] note greater than 40 minutes spent assessing patient-reviewing his chart-reviewing his labs-and coordinating and formulating plan of care for  numerous diagnoses-note greater than 50% of time spent coordinating a plan of care

## 2017-10-23 ENCOUNTER — Non-Acute Institutional Stay (SKILLED_NURSING_FACILITY): Payer: Medicare HMO | Admitting: Internal Medicine

## 2017-10-23 ENCOUNTER — Encounter: Payer: Self-pay | Admitting: Internal Medicine

## 2017-10-23 ENCOUNTER — Encounter (HOSPITAL_COMMUNITY)
Admission: RE | Admit: 2017-10-23 | Discharge: 2017-10-23 | Disposition: A | Discharge status: Home / Self Care | Payer: Medicare HMO | Source: Skilled Nursing Facility | Attending: Internal Medicine | Admitting: Internal Medicine

## 2017-10-23 DIAGNOSIS — R21 Rash and other nonspecific skin eruption: Secondary | ICD-10-CM

## 2017-10-23 DIAGNOSIS — Z8781 Personal history of (healed) traumatic fracture: Secondary | ICD-10-CM

## 2017-10-23 DIAGNOSIS — Z4789 Encounter for other orthopedic aftercare: Secondary | ICD-10-CM | POA: Diagnosis not present

## 2017-10-23 DIAGNOSIS — E871 Hypo-osmolality and hyponatremia: Secondary | ICD-10-CM | POA: Insufficient documentation

## 2017-10-23 DIAGNOSIS — R221 Localized swelling, mass and lump, neck: Secondary | ICD-10-CM

## 2017-10-23 DIAGNOSIS — Z9889 Other specified postprocedural states: Secondary | ICD-10-CM

## 2017-10-23 DIAGNOSIS — J189 Pneumonia, unspecified organism: Secondary | ICD-10-CM | POA: Diagnosis not present

## 2017-10-23 DIAGNOSIS — J42 Unspecified chronic bronchitis: Secondary | ICD-10-CM | POA: Diagnosis not present

## 2017-10-23 DIAGNOSIS — J392 Other diseases of pharynx: Secondary | ICD-10-CM

## 2017-10-23 DIAGNOSIS — I10 Essential (primary) hypertension: Secondary | ICD-10-CM | POA: Insufficient documentation

## 2017-10-23 DIAGNOSIS — D72829 Elevated white blood cell count, unspecified: Secondary | ICD-10-CM | POA: Insufficient documentation

## 2017-10-23 DIAGNOSIS — Z967 Presence of other bone and tendon implants: Secondary | ICD-10-CM | POA: Diagnosis not present

## 2017-10-23 DIAGNOSIS — E782 Mixed hyperlipidemia: Secondary | ICD-10-CM

## 2017-10-23 DIAGNOSIS — E118 Type 2 diabetes mellitus with unspecified complications: Secondary | ICD-10-CM

## 2017-10-23 LAB — CBC WITH DIFFERENTIAL/PLATELET
BASOS ABS: 0 10*3/uL (ref 0.0–0.1)
BASOS PCT: 0 %
Eosinophils Absolute: 0.9 10*3/uL — ABNORMAL HIGH (ref 0.0–0.7)
Eosinophils Relative: 12 %
HEMATOCRIT: 30.6 % — AB (ref 39.0–52.0)
HEMOGLOBIN: 9.8 g/dL — AB (ref 13.0–17.0)
LYMPHS PCT: 15 %
Lymphs Abs: 1.1 10*3/uL (ref 0.7–4.0)
MCH: 30.3 pg (ref 26.0–34.0)
MCHC: 32 g/dL (ref 30.0–36.0)
MCV: 94.7 fL (ref 78.0–100.0)
Monocytes Absolute: 0.6 10*3/uL (ref 0.1–1.0)
Monocytes Relative: 8 %
NEUTROS ABS: 5.1 10*3/uL (ref 1.7–7.7)
NEUTROS PCT: 65 %
Platelets: 263 10*3/uL (ref 150–400)
RBC: 3.23 MIL/uL — AB (ref 4.22–5.81)
RDW: 14.6 % (ref 11.5–15.5)
WBC: 7.8 10*3/uL (ref 4.0–10.5)

## 2017-10-23 LAB — BASIC METABOLIC PANEL
ANION GAP: 9 (ref 5–15)
BUN: 25 mg/dL — ABNORMAL HIGH (ref 6–20)
CALCIUM: 9.3 mg/dL (ref 8.9–10.3)
CHLORIDE: 99 mmol/L — AB (ref 101–111)
CO2: 24 mmol/L (ref 22–32)
Creatinine, Ser: 1.08 mg/dL (ref 0.61–1.24)
GFR calc non Af Amer: >60 mL/min (ref >60)
Glucose, Bld: 203 mg/dL — ABNORMAL HIGH (ref 65–99)
POTASSIUM: 4.1 mmol/L (ref 3.5–5.1)
Sodium: 132 mmol/L — ABNORMAL LOW (ref 135–145)

## 2017-10-23 NOTE — Progress Notes (Signed)
Provider: Veleta Miners  Location:   [facility] Room Number: 155/P Place of Service:  SNF (31)  PCP: Sinda Du, MD Patient Care Team: Sinda Du, MD as PCP - General (Internal Medicine)  Extended Emergency Contact Information Primary Emergency Contact: Furney,Chyrl Address: [address]          [place],  [address] [place] Phone: [phone] Mobile Phone: [phone] Relation: Spouse  Code Status: Full Code Goals of Care: Advanced Directive information Advanced Directives 10/23/2017  Does Patient Have a Medical Advance Directive? Yes  Type of Advance Directive (No Data)  Does patient want to make changes to medical advance directive? No - Patient declined  Would patient like information on creating a medical advance directive? No - Patient declined      Chief Complaint  Patient presents with  . Readmit To SNF    Readmission Visit    HPI: Patient is a 81 y.o. male seen today for admission to SNF for therapy. Patient has h/o Diabetes Type 2 , CAD S/P Stent placement, CHF, Neuropathy, Hypertension, Hyperlipidemia, COPD with Recent use of  Tobacco.  He has been in the facility after undergoing Intramedullary implant for Right Femur on 09/29 after sustaining Fall and Femoral fracture. He developed low grade fever,SOB and cough in the facility. His Pox was 60 % on Room air. His Chest Xray showed Right Upper Lobe infiltrate.He was treated with IV antibiotics including Vanco, Cefepime and Zithromax. He was changed to Oral Medication Levaquin on 11/27. He is doing well. Is Afebrile. His Sats still drop to 80 % on Room air. Still has cough. No Chest pain. His main complains Rash all over his body mostly in back and Trunk. It is itchy. He is applying triamcinolone cream but it is not helping. He says this started after he was started on IV Antibiotics in the hospital. Patient has taken Levaquin in facility before with no  issues.  His hip pain is controlled and he is walking few feet with Walker. His plan is to go back home with his wife.     Past Medical History:  Diagnosis Date  . Arthritis   . Constipation   . COPD (chronic obstructive pulmonary disease) ([facility])   . Coronary artery disease   . Diabetes mellitus   . Hypertension   . Skin cancer of face    Past Surgical History:  Procedure Laterality Date  . APPENDECTOMY  1958  . BACK SURGERY    . CORONARY STENT PLACEMENT    . FEMUR IM NAIL Right 08/23/2017   Procedure: INTRAMEDULLARY (IM) NAIL FEMORAL;  Surgeon: Nicholes Stairs, MD;  Location: [facility];  Service: Orthopedics;  Laterality: Right;  . SKIN CANCER EXCISION  07/2017    reports that he has been smoking cigarettes.  He has a 25.00 pack-year smoking history. he has never used smokeless tobacco. He reports that he does not drink alcohol or use drugs. Social History   Socioeconomic History  . Marital status: Married    Spouse name: Not on file  . Number of children: Not on file  . Years of education: Not on file  . Highest education level: Not on file  Social Needs  . Financial resource strain: Not on file  . Food insecurity - worry: Not on file  . Food insecurity - inability: Not on file  . Transportation needs - medical: Not on file  . Transportation needs - non-medical: Not on file  Occupational History  .  Not on file  Tobacco Use  . Smoking status: Current Every Day Smoker    Packs/day: 1.00    Years: 25.00    Pack years: 25.00    Types: Cigarettes  . Smokeless tobacco: Never Used  Substance and Sexual Activity  . Alcohol use: No  . Drug use: No  . Sexual activity: Not on file  Other Topics Concern  . Not on file  Social History Narrative  . Not on file    Functional Status Survey:    Family History  Problem Relation Age of Onset  . Diabetes Mother     Health Maintenance  Topic Date Due  . FOOT EXAM  10/25/2017 (Originally 10/19/1946)  . OPHTHALMOLOGY  EXAM  10/25/2017 (Originally 10/19/1946)  . URINE MICROALBUMIN  10/25/2017 (Originally 10/19/1946)  . HEMOGLOBIN A1C  02/09/2018  . PNA vac Low Risk Adult (2 of 2 - PPSV23) 10/06/2018  . TETANUS/TDAP  10/07/2027  . INFLUENZA VACCINE  Completed    No Known Allergies  Outpatient Encounter Medications as of 10/23/2017  Medication Sig  . aspirin EC 81 MG tablet Take 81 mg by mouth 2 (two) times daily.  Roseanne Kaufman Peru-Castor Oil (VENELEX) OINT Apply to sacrum and bilateral buttocks q shift and prn erythema. Every shift  . cholecalciferol (VITAMIN D) 1000 units tablet Take 5,000 Units by mouth daily.  Marland Kitchen gabapentin (NEURONTIN) 100 MG capsule Take 200 mg every morning by mouth.  . gabapentin (NEURONTIN) 300 MG capsule Take 1 capsule (300 mg total) by mouth at bedtime.  Marland Kitchen guaiFENesin (MUCINEX) 600 MG 12 hr tablet Take 2 tablets (1,200 mg total) by mouth 2 (two) times daily.  . insulin aspart (NOVOLOG FLEXPEN) 100 UNIT/ML FlexPen Inject into the skin 3 (three) times daily with meals. sliding scale  . ipratropium-albuterol (DUONEB) 0.5-2.5 (3) MG/3ML SOLN Take 3 mLs by nebulization every 6 (six) hours as needed.  Marland Kitchen levofloxacin (LEVAQUIN) 500 MG tablet 1 daily for 7 days  . magnesium gluconate (MAGONATE) 500 MG tablet Take 500 mg by mouth daily.  . Melatonin 3 MG TABS Take 3 mg by mouth at bedtime.  . metFORMIN (GLUCOPHAGE) 1000 MG tablet Take 1 tablet (1,000 mg total) by mouth 2 (two) times daily with a meal.  . methocarbamol (ROBAXIN) 500 MG tablet Take 1 tablet (500 mg total) by mouth every 6 (six) hours as needed for muscle spasms.  . metoprolol tartrate (LOPRESSOR) 50 MG tablet Take 50 mg by mouth 2 (two) times daily.   . Misc Natural Products (OSTEO BI-FLEX ADV TRIPLE ST PO) Take 1 tablet by mouth once a day  . Multiple Vitamin (MULTIVITAMIN WITH MINERALS) TABS tablet Take 1 tablet by mouth daily.  . mupirocin ointment (BACTROBAN) 2 % Apply topically 2 (two) times daily.  Marland Kitchen  Phenyleph-CPM-DM-Aspirin (ALKA-SELTZER PLUS COLD & COUGH PO) Take 2 tablets by mouth daily as needed (nasal congestion).  . polyethylene glycol (MIRALAX / GLYCOLAX) packet Take 17 g by mouth daily.  . simvastatin (ZOCOR) 40 MG tablet Take 40 mg by mouth daily.  . tamsulosin (FLOMAX) 0.4 MG CAPS capsule Take 0.4 mg by mouth.  . triamcinolone cream (KENALOG) 0.5 % Apply 1 application topically 3 (three) times daily.  Marland Kitchen trolamine salicylate (ASPERCREME) 10 % cream Apply 1 application topically as needed for muscle pain.   No facility-administered encounter medications on file as of 10/23/2017.      Review of Systems  Constitutional: Negative.   HENT: Positive for congestion.   Respiratory: Positive for  cough and shortness of breath.   Cardiovascular: Positive for leg swelling.  Gastrointestinal: Negative.   Genitourinary: Negative.   Musculoskeletal: Negative.   Skin: Positive for rash.  Neurological: Negative.   Psychiatric/Behavioral: Positive for sleep disturbance.      Vitals:   10/23/17 1309  BP: 120/75  Pulse: 78  Resp: 18  Temp: 98 F (36.7 C)  SpO2: 97%   There is no height or weight on file to calculate BMI. Physical Exam  Constitutional: He is oriented to person, place, and time. He appears well-developed and well-nourished.  HENT:  Head: Normocephalic.  Mouth/Throat: Oropharynx is clear and moist.  Eyes: Pupils are equal, round, and reactive to light.  Neck: Neck supple.  Cardiovascular: Normal rate and normal heart sounds.  No murmur heard. Pulmonary/Chest: Effort normal. No respiratory distress. He has no wheezes. He has no rales.  Abdominal: Soft. Bowel sounds are normal. He exhibits no distension. There is no tenderness. There is no rebound.  Musculoskeletal:  Mild edema Bilateral.  Lymphadenopathy:    He has no cervical adenopathy.  Neurological: He is alert and oriented to person, place, and time.  No Focal deficits  Skin:  Has Erythematous rash in  his back and Trunk and    Labs reviewed: Basic Metabolic Panel: Recent Labs    10/09/17 0400 10/17/17 0900 10/18/17 0709 10/23/17 0705  NA  --  133* 133* 132*  K  --  4.5 4.4 4.1  CL  --  97* 100* 99*  CO2  --  27 26 24   GLUCOSE  --  212* 249* 203*  BUN  --  36* 24* 25*  CREATININE  --  1.21 1.00 1.08  CALCIUM  --  9.0 8.5* 9.3  MG 1.7  --   --   --    Liver Function Tests: Recent Labs    09/23/17 0630 09/30/17 0704 10/17/17 0900  AST 24 23 21   ALT 10* 12* 14*  ALKPHOS 149* 141* 100  BILITOT 0.9 0.7 0.7  PROT 6.7 7.0 7.2  ALBUMIN 3.3* 3.4* 3.0*   No results for input(s): LIPASE, AMYLASE in the last 8760 hours. No results for input(s): AMMONIA in the last 8760 hours. CBC: Recent Labs    10/09/17 0400 10/17/17 0900 10/18/17 0709 10/23/17 0705  WBC 5.5 8.2 7.1 7.8  NEUTROABS 3.4 5.6  --  5.1  HGB 10.9* 11.0* 9.7* 9.8*  HCT 33.4* 34.9* 30.9* 30.6*  MCV 96.8 97.8 97.8 94.7  PLT 160 202 207 263   Cardiac Enzymes: Recent Labs    10/17/17 0900 10/17/17 1153  TROPONINI 0.05* 0.05*   BNP: Invalid input(s): POCBNP Lab Results  Component Value Date   HGBA1C 6.0 (H) 08/12/2017   No results found for: TSH No results found for: VITAMINB12 No results found for: FOLATE No results found for: IRON, TIBC, FERRITIN  Imaging and Procedures obtained prior to SNF admission: No results found.  Assessment/Plan  HCAP (healthcare-associated pneumonia) He is doing well on Levaquin . Finishing course on 12/04. Continue O2 supplement. Labs are stable. Plan for repeat Chest Xray in 1 month to see resolution of infiltrate.  Rash  His rash seems allergic reaction to Antibiotics in hospital though he has no Allergies listed. He has been on Levaquin before with no Problems. So little hesitant to discontinue Levaquin Will Start him on Prednisone 40 mg for 5 days and also cetrizine for 5 days. Revaluate. Continue Triamcinolone cream PRN.  COPD On Prn  Bronchodilators. Continue Oxygen  supplement. Type 2 diabetes mellitus With Neuropathy His BS have been above 200 . Will continue on Metformin. Also Sliding scale for now as he is going to get Prednisone. Patient has been on [place] before which was discontinued due to him loosing weight and well controlled diabetes. Will reconsider starting Levimir if BS continues run high   S/P Intramedullary implant right femur Healing well. And working with therapy. Aspirin QD per ortho. Not on Any Narcotics Just Robaxin and Neurontin. Anemia due to acute blood loss. Required transfusion after surgery. On iron supplement Hgb has improved. Will restart Iron.  Essential hypertension, benign His lisinopril was stopped due to Hypotension and Renal insufficiency. With his Low BP his Metoprolol was decreased  to 50 mg BID.  Oro pharanyx mass Was found incidental on CT scan. Needs follow up with ENT at discharge.  Smoking Patein says he has quit smoking now.  Disposition Eventually Wants to go home with his wife.    Family/ staff Communication:   Labs/tests ordered: Repeat Labs in 1 week. Total time spent in this patient care encounter was 45_ minutes; greater than 50% of the visit spent counseling patient, reviewing records , Labs and coordinating care for problems addressed at this encounter.

## 2017-10-30 ENCOUNTER — Encounter (HOSPITAL_COMMUNITY)
Admission: RE | Admit: 2017-10-30 | Discharge: 2017-10-30 | Disposition: A | Discharge status: Home / Self Care | Payer: Medicare HMO | Source: Skilled Nursing Facility | Attending: Internal Medicine | Admitting: Internal Medicine

## 2017-10-30 DIAGNOSIS — D72829 Elevated white blood cell count, unspecified: Secondary | ICD-10-CM | POA: Insufficient documentation

## 2017-10-30 DIAGNOSIS — J189 Pneumonia, unspecified organism: Secondary | ICD-10-CM | POA: Insufficient documentation

## 2017-10-30 DIAGNOSIS — Z4789 Encounter for other orthopedic aftercare: Secondary | ICD-10-CM | POA: Insufficient documentation

## 2017-10-30 DIAGNOSIS — E871 Hypo-osmolality and hyponatremia: Secondary | ICD-10-CM | POA: Insufficient documentation

## 2017-10-30 LAB — CBC
HEMATOCRIT: 37.8 % — AB (ref 39.0–52.0)
Hemoglobin: 11.8 g/dL — ABNORMAL LOW (ref 13.0–17.0)
MCH: 30.6 pg (ref 26.0–34.0)
MCHC: 31.2 g/dL (ref 30.0–36.0)
MCV: 98.2 fL (ref 78.0–100.0)
PLATELETS: 272 10*3/uL (ref 150–400)
RBC: 3.85 MIL/uL — ABNORMAL LOW (ref 4.22–5.81)
RDW: 15.4 % (ref 11.5–15.5)
WBC: 9.3 10*3/uL (ref 4.0–10.5)

## 2017-10-30 LAB — COMPREHENSIVE METABOLIC PANEL
ALT: 17 U/L (ref 17–63)
ANION GAP: 6 (ref 5–15)
AST: 14 U/L — AB (ref 15–41)
Albumin: 3.1 g/dL — ABNORMAL LOW (ref 3.5–5.0)
Alkaline Phosphatase: 97 U/L (ref 38–126)
BILIRUBIN TOTAL: 0.5 mg/dL (ref 0.3–1.2)
BUN: 28 mg/dL — ABNORMAL HIGH (ref 6–20)
CHLORIDE: 99 mmol/L — AB (ref 101–111)
CO2: 30 mmol/L (ref 22–32)
Calcium: 9.3 mg/dL (ref 8.9–10.3)
Creatinine, Ser: 0.92 mg/dL (ref 0.61–1.24)
Glucose, Bld: 155 mg/dL — ABNORMAL HIGH (ref 65–99)
POTASSIUM: 4.4 mmol/L (ref 3.5–5.1)
Sodium: 135 mmol/L (ref 135–145)
TOTAL PROTEIN: 6.9 g/dL (ref 6.5–8.1)

## 2017-10-31 DIAGNOSIS — S7221XD Displaced subtrochanteric fracture of right femur, subsequent encounter for closed fracture with routine healing: Secondary | ICD-10-CM | POA: Diagnosis not present

## 2017-11-21 ENCOUNTER — Encounter: Payer: Self-pay | Admitting: Internal Medicine

## 2017-11-21 ENCOUNTER — Non-Acute Institutional Stay (SKILLED_NURSING_FACILITY): Payer: Medicare HMO | Admitting: Internal Medicine

## 2017-11-21 DIAGNOSIS — J42 Unspecified chronic bronchitis: Secondary | ICD-10-CM

## 2017-11-21 DIAGNOSIS — R05 Cough: Secondary | ICD-10-CM | POA: Diagnosis not present

## 2017-11-21 DIAGNOSIS — I10 Essential (primary) hypertension: Secondary | ICD-10-CM

## 2017-11-21 DIAGNOSIS — D62 Acute posthemorrhagic anemia: Secondary | ICD-10-CM | POA: Diagnosis not present

## 2017-11-21 DIAGNOSIS — R059 Cough, unspecified: Secondary | ICD-10-CM

## 2017-11-21 NOTE — Progress Notes (Signed)
Location:   [facility] Room Number: 155/P Place of Service:  SNF (31) Provider:  Terisa Starr, MD  Patient Care Team: Sinda Du, MD as PCP - General (Internal Medicine)  Extended Emergency Contact Information Primary Emergency Contact: Petron,Chyrl Address: [address]          [place], [place] [address] [place] Phone: [phone] Mobile Phone: [phone] Relation: Spouse  Code Status:  Full Code Goals of care: Advanced Directive information Advanced Directives 10/23/2017  Does Patient Have a Medical Advance Directive? Yes  Type of Advance Directive (No Data)  Does patient want to make changes to medical advance directive? No - Patient declined  Would patient like information on creating a medical advance directive? No - Patient declined     Chief Complaint  Patient presents with  . Acute Visit    Patient c/o Cold, Cough, and Congestion    HPI:  Pt is a 81 y.o. male seen today for an acute visit for cold-like symptoms with cough.  Productive of yellow greenish phlegm-she does not complain of any shortness of breath appears she uses oxygen as needed as needed but says he is doing fine in this regards.  He does have a history of diabetes type 2 as well as coronary artery disease CHF neuropathy hypertension COPD with recent use of tobacco as well as hyperlipidemia.  He is been here for rehab after undergoing an implant for a right femur after sustaining a fall and a femoral fracture.  Late last month he developed a low-grade fever shortness of breath and cough chest x-ray showed right upper lobe infiltrate he was treated with IV antibiotics in the hospital including Vanco cefepime and Zithromax was changed to oral medication on discharge he was on Levaquin.  Since then he has been quite stable but has developed apparently a cough as noted above.  O2 saturation is in the 90s-       Past Medical History:    Diagnosis Date  . Arthritis   . Constipation   . COPD (chronic obstructive pulmonary disease) ([facility])   . Coronary artery disease   . Diabetes mellitus   . Hypertension   . Skin cancer of face    Past Surgical History:  Procedure Laterality Date  . APPENDECTOMY  1958  . BACK SURGERY    . CORONARY STENT PLACEMENT    . FEMUR IM NAIL Right 08/23/2017   Procedure: INTRAMEDULLARY (IM) NAIL FEMORAL;  Surgeon: Nicholes Stairs, MD;  Location: [facility];  Service: Orthopedics;  Laterality: Right;  . SKIN CANCER EXCISION  07/2017    No Known Allergies  Outpatient Encounter Medications as of 11/21/2017  Medication Sig  . acetaminophen (TYLENOL) 325 MG tablet Take 650 mg by mouth every 6 (six) hours as needed.  Marland Kitchen aspirin EC 81 MG tablet Take 81 mg by mouth 2 (two) times daily.  Roseanne Kaufman Peru-Castor Oil (VENELEX) OINT Apply to sacrum and bilateral buttocks q shift and prn erythema. Every shift  . cholecalciferol (VITAMIN D) 1000 units tablet Take 5,000 Units by mouth daily.  . ferrous sulfate (IRON SUPPLEMENT) 325 (65 FE) MG tablet Take 325 mg by mouth daily with breakfast.  . gabapentin (NEURONTIN) 100 MG capsule Take 200 mg every morning by mouth.  . gabapentin (NEURONTIN) 300 MG capsule Take 1 capsule (300 mg total) by mouth at bedtime.  . insulin aspart (NOVOLOG FLEXPEN) 100 UNIT/ML FlexPen Inject into the skin 2 (two)  times daily. sliding scale   . ipratropium-albuterol (DUONEB) 0.5-2.5 (3) MG/3ML SOLN Take 3 mLs by nebulization every 6 (six) hours as needed.  . magnesium gluconate (MAGONATE) 500 MG tablet Take 500 mg by mouth daily.  . Melatonin 3 MG TABS Take 3 mg by mouth at bedtime.  . metFORMIN (GLUCOPHAGE) 1000 MG tablet Take 1 tablet (1,000 mg total) by mouth 2 (two) times daily with a meal.  . methocarbamol (ROBAXIN) 500 MG tablet Take 1 tablet (500 mg total) by mouth every 6 (six) hours as needed for muscle spasms.  . metoprolol tartrate (LOPRESSOR) 50 MG tablet Take 50 mg by  mouth 2 (two) times daily.   . Misc Natural Products (OSTEO BI-FLEX ADV TRIPLE ST PO) Take 1 tablet by mouth once a day  . Multiple Vitamin (MULTIVITAMIN WITH MINERALS) TABS tablet Take 1 tablet by mouth daily.  Marland Kitchen Phenyleph-CPM-DM-Aspirin (ALKA-SELTZER PLUS COLD & COUGH PO) Take 2 tablets by mouth daily as needed (nasal congestion).  . polyethylene glycol (MIRALAX / GLYCOLAX) packet Take 17 g by mouth daily.  . simvastatin (ZOCOR) 40 MG tablet Take 40 mg by mouth daily.  . tamsulosin (FLOMAX) 0.4 MG CAPS capsule Take 0.4 mg by mouth.  . trolamine salicylate (ASPERCREME) 10 % cream Apply 1 application topically as needed for muscle pain.  . [DISCONTINUED] guaiFENesin (MUCINEX) 600 MG 12 hr tablet Take 2 tablets (1,200 mg total) by mouth 2 (two) times daily.  . [DISCONTINUED] levofloxacin (LEVAQUIN) 500 MG tablet 1 daily for 7 days  . [DISCONTINUED] mupirocin ointment (BACTROBAN) 2 % Apply topically 2 (two) times daily.  . [DISCONTINUED] triamcinolone cream (KENALOG) 0.5 % Apply 1 application topically 3 (three) times daily.   No facility-administered encounter medications on file as of 11/21/2017.     Review of Systems in general is not complaining any fever chills   .  Skin does not complain of itching or diaphoresis.  Head ears eyes nose mouth and throat is not really complaining discharge from the or nasal drainage.  Respiratory is not complaining of shortness of breath he does have cough he says is productive of yellow-green sputum.  Cardiac is not complaining of any chest pain has mild lower extremity edema.  GI is not complaining of abdominal discomfort nausea vomiting diarrhea constipation  .  Musculoskeletal was not really complaining of joint pain today Will be early Continues to ambulate about in wheelchair.  Neurologic does not complain of dizziness headache or syncope.  Psych continues to be in good spirits is not complaining of overt depression or anxiety     Immunization History  Administered Date(s) Administered  . Influenza-Unspecified 08/11/2017  . Pneumococcal Conjugate-13 10/06/2017  . Tdap 10/06/2017   Pertinent  Health Maintenance Due  Topic Date Due  . FOOT EXAM  10/19/1946  . OPHTHALMOLOGY EXAM  10/19/1946  . URINE MICROALBUMIN  10/19/1946  . HEMOGLOBIN A1C  02/09/2018  . PNA vac Low Risk Adult (2 of 2 - PPSV23) 10/06/2018  . INFLUENZA VACCINE  Completed   Fall Risk  02/13/2017 05/06/2016 02/02/2016 10/24/2015  Falls in the past year? No No No No   Functional Status Survey:    Vitals:   11/21/17 1139  BP: 110/63  Pulse: 74  Resp: 18  Temp: (!) 97.1 F (36.2 C)  TempSrc: Oral  SpO2: 97%  Weight: 172 lb 12.8 oz (78.4 kg)  Height: 6' (1.829 m)   Body mass index is 23.44 kg/m. Physical Exam   In general this is  a pleasant elderly male in no distress sitting comfortably in his wheelchair.  His skin is warm and dry is not diaphoretic.  Oropharynx is clear mucous membranes moist.  Eyes visual acuity appears grossly intact I do not see any active drainage.  Chest is clear to auscultation there is no labored breathing somewhat shallow air entry.  Heart is regular rate and rhythm without murmur gallop or rub he continues to have mild baseline lower extremity edema.  Abdomen is soft nontender with positive bowel sounds.  Musculoskeletal continues to move all extremities at baseline ambulates well in a wheelchair  Neurologic is grossly intact his speech is clear he is alert.  Psych he is alert and oriented pleasant and appropriate      Labs reviewed: Recent Labs    10/09/17 0400  10/18/17 0709 10/23/17 0705 10/30/17 0747  NA  --    < > 133* 132* 135  K  --    < > 4.4 4.1 4.4  CL  --    < > 100* 99* 99*  CO2  --    < > 26 24 30   GLUCOSE  --    < > 249* 203* 155*  BUN  --    < > 24* 25* 28*  CREATININE  --    < > 1.00 1.08 0.92  CALCIUM  --    < > 8.5* 9.3 9.3  MG 1.7  --   --   --   --    < > =  values in this interval not displayed.   Recent Labs    09/30/17 0704 10/17/17 0900 10/30/17 0747  AST 23 21 14*  ALT 12* 14* 17  ALKPHOS 141* 100 97  BILITOT 0.7 0.7 0.5  PROT 7.0 7.2 6.9  ALBUMIN 3.4* 3.0* 3.1*   Recent Labs    10/09/17 0400 10/17/17 0900 10/18/17 0709 10/23/17 0705 10/30/17 0747  WBC 5.5 8.2 7.1 7.8 9.3  NEUTROABS 3.4 5.6  --  5.1  --   HGB 10.9* 11.0* 9.7* 9.8* 11.8*  HCT 33.4* 34.9* 30.9* 30.6* 37.8*  MCV 96.8 97.8 97.8 94.7 98.2  PLT 160 202 207 263 272   No results found for: TSH Lab Results  Component Value Date   HGBA1C 6.0 (H) 08/12/2017   Lab Results  Component Value Date   CHOL 110 10/09/2017   HDL 48 10/09/2017   LDLCALC 41 10/09/2017   TRIG 107 10/09/2017   CHOLHDL 2.3 10/09/2017    Significant Diagnostic Results in last 30 days:  No results found.  Assessment/Plan  #1-cough with history of COPD -will order a chest x-ray especially with his history- consider initiation of antibiotic but will await results of chest x-ray.  Also will add Mucinex 600 mg twice daily has as needed nebulizers every 6 hours  Continue to monitor vital signs with pulse ox every shif  #2-anemia this appears to have improved--he is on iron- hemoglobin was 11.8 on lab done earlier this month  #3 hypertension--this appears stable blood pressure today 110/63 previously 108/59-112/67 continues on metoprolol dose was reduced during his hospitalization lisinopril also.  Because of hypotension concerns.  [id number]

## 2017-11-23 ENCOUNTER — Non-Acute Institutional Stay (SKILLED_NURSING_FACILITY): Payer: Medicare HMO | Admitting: Internal Medicine

## 2017-11-23 ENCOUNTER — Encounter: Payer: Self-pay | Admitting: Internal Medicine

## 2017-11-23 DIAGNOSIS — J189 Pneumonia, unspecified organism: Secondary | ICD-10-CM | POA: Diagnosis not present

## 2017-11-23 DIAGNOSIS — J42 Unspecified chronic bronchitis: Secondary | ICD-10-CM

## 2017-11-23 NOTE — Progress Notes (Signed)
Location:   [facility] Room Number: 155/P Place of Service:  SNF (31) Provider:  Terisa Starr, MD  Patient Care Team: Sinda Du, MD as PCP - General (Internal Medicine)  Extended Emergency Contact Information Primary Emergency Contact: Reddish,Chyrl Address: [address]          [place], [place] [address] [place] Phone: [phone] Mobile Phone: [phone] Relation: Spouse  Code Status:  Full Code Goals of care: Advanced Directive information Advanced Directives 11/23/2017  Does Patient Have a Medical Advance Directive? Yes  Type of Advance Directive (No Data)  Does patient want to make changes to medical advance directive? No - Patient declined  Would patient like information on creating a medical advance directive? No - Patient declined     Chief complaint acute visit follow-up chest x-ray showing possible pneumonia   HPI:  Pt is a 81 y.o. male seen today for an acute visit for follow-up of cough with a chest x-ray suspicious for pneumonitis   Patient was seen earlier this week with complaints of cough with yellowish greenish phlegm.  Did not complain of any shortness of breath.  He does have also history of hypertension coronary artery disease  and COPD with recent use of tobacco as well as diabetes type 2.  He is [place] here after a right femur repair after sustaining a fall.  Last month he did develop a low-grade fever shortness of breath and cough chest survey showed right upper lobe infiltrate he was treated with IV antibiotics in the hospital was changed to oral medication Levaquin on discharge.  I saw him the other day a chest x-ray was ordered which did come back showing possible pneumonia-pneumonitis interstitial versus interstitial scarring pulmonary fibrosis.  In comparison to prior x-ray findings were thought to be somewhat mildly worsened   he has been started on Levaquin.  Today he  feels he is doing all right and is stable.  Continues to have some upper respiratory congestion we have started him on Mucinex-he also has duo nebs every 6 hours as needed.  Vital signs are stable O2 saturation is in the 90s       Past Medical History:  Diagnosis Date  . Arthritis   . Constipation   . COPD (chronic obstructive pulmonary disease) ([facility])   . Coronary artery disease   . Diabetes mellitus   . Hypertension   . Skin cancer of face    Past Surgical History:  Procedure Laterality Date  . APPENDECTOMY  1958  . BACK SURGERY    . CORONARY STENT PLACEMENT    . FEMUR IM NAIL Right 08/23/2017   Procedure: INTRAMEDULLARY (IM) NAIL FEMORAL;  Surgeon: Nicholes Stairs, MD;  Location: [facility];  Service: Orthopedics;  Laterality: Right;  . SKIN CANCER EXCISION  07/2017    No Known Allergies  Outpatient Encounter Medications as of 11/23/2017  Medication Sig  . acetaminophen (TYLENOL) 325 MG tablet Take 650 mg by mouth every 6 (six) hours as needed.  Marland Kitchen aspirin EC 81 MG tablet Take 81 mg by mouth daily.   Roseanne Kaufman Peru-Castor Oil (VENELEX) OINT Apply to sacrum and bilateral buttocks q shift and prn erythema. Every shift  . cholecalciferol (VITAMIN D) 1000 units tablet Take 5,000 Units by mouth daily.  . ferrous sulfate (IRON SUPPLEMENT) 325 (65 FE) MG tablet Take 325 mg by mouth daily with breakfast.  . gabapentin (NEURONTIN) 100 MG capsule Take 200 mg every  morning by mouth.  . gabapentin (NEURONTIN) 300 MG capsule Take 1 capsule (300 mg total) by mouth at bedtime.  Marland Kitchen guaiFENesin (MUCINEX) 600 MG 12 hr tablet Take 600 mg by mouth 2 (two) times daily.  . insulin aspart (NOVOLOG FLEXPEN) 100 UNIT/ML FlexPen Inject into the skin 2 (two) times daily. sliding scale   . ipratropium-albuterol (DUONEB) 0.5-2.5 (3) MG/3ML SOLN Take 3 mLs by nebulization every 6 (six) hours as needed.  . Lactobacillus Rhamnosus, GG, ([employer] PROBIOTIC, LACTOBACILLUS,) CAPS Take 1 capsule by mouth twice a  day.  . levofloxacin (LEVAQUIN) 500 MG tablet Take 500 mg by mouth daily.  . magnesium gluconate (MAGONATE) 500 MG tablet Take 500 mg by mouth daily.  . Melatonin 3 MG TABS Take 3 mg by mouth at bedtime.  . metFORMIN (GLUCOPHAGE) 1000 MG tablet Take 1 tablet (1,000 mg total) by mouth 2 (two) times daily with a meal.  . methocarbamol (ROBAXIN) 500 MG tablet Take 1 tablet (500 mg total) by mouth every 6 (six) hours as needed for muscle spasms.  . metoprolol tartrate (LOPRESSOR) 50 MG tablet Take 50 mg by mouth 2 (two) times daily.   . Misc Natural Products (OSTEO BI-FLEX ADV TRIPLE ST PO) Take 1 tablet by mouth once a day  . Multiple Vitamin (MULTIVITAMIN WITH MINERALS) TABS tablet Take 1 tablet by mouth daily.  Marland Kitchen Phenyleph-CPM-DM-Aspirin (ALKA-SELTZER PLUS COLD & COUGH PO) Take 2 tablets by mouth daily as needed (nasal congestion).  . polyethylene glycol (MIRALAX / GLYCOLAX) packet Take 17 g by mouth daily.  . simvastatin (ZOCOR) 40 MG tablet Take 40 mg by mouth daily.  . tamsulosin (FLOMAX) 0.4 MG CAPS capsule Take 0.4 mg by mouth.  . trolamine salicylate (ASPERCREME) 10 % cream Apply 1 application topically as needed for muscle pain.   No facility-administered encounter medications on file as of 11/23/2017.     Review of Systems   In general he has no complaints today says he feels a stable respiratory wise still appears to have some nasal upper respiratory congestion with cough.  Skin is not complaining of rashes still has some  itching he did have a rash on his back which appears to have pretty much resolved.  Head ears eyes nose mouth and throat still has some upper respiratory nasal congestion cough-does not complain of visual changes.  Oropharynx does not complain currently of sore throat.  Respiratory does not complain of any increased shortness of breath beyond baseline feel he is stable in this regards but does still have a cough and chest x-ray had shown possible  pneumonitis.  Cardiac does not complain of chest pain has baseline lower extremity edema.  GI does not complain of abdominal discomfort nausea vomiting diarrhea constipation.  Musculoskeletal does not complain of joint discomfort currently he is ambulatory in a wheelchair.  Neurologic is not really complaining of dizziness headache or syncope.  Psych appears to be in good spirits is not really complaining of being anxious or depressed today  Immunization History  Administered Date(s) Administered  . Influenza-Unspecified 08/11/2017  . Pneumococcal Conjugate-13 10/06/2017  . Tdap 10/06/2017   Pertinent  Health Maintenance Due  Topic Date Due  . FOOT EXAM  12/24/2017 (Originally 10/19/1946)  . OPHTHALMOLOGY EXAM  12/24/2017 (Originally 10/19/1946)  . URINE MICROALBUMIN  12/24/2017 (Originally 10/19/1946)  . HEMOGLOBIN A1C  02/09/2018  . PNA vac Low Risk Adult (2 of 2 - PPSV23) 10/06/2018  . INFLUENZA VACCINE  Completed   Fall Risk  02/13/2017  05/06/2016 02/02/2016 10/24/2015  Falls in the past year? No No No No   Functional Status Survey:    Vitals:   11/23/17 1211  BP: 132/84  Pulse: 75  Resp: 19  Temp: 98.2 F (36.8 C)  TempSrc: Oral  SpO2: 94%  Weight is 172.8 pounds  Physical Exam   In general is not complaining of any fever or chills feels he stable respiratory wise.  Skin is not complaining of diaphoresis rash on back appears to have largely resolved   Oropharynx is clear mucous membranes moist.  Chest is clear to auscultation there is no labored breathing.  Continues to have somewhat shallow air entry.  Heart is regular rate and rhythm without murmur gallop or rub he has what appears to be fairly mild baseline lower extremity edema.  Abdomen is soft nontender with positive bowel sounds.  Musculoskeletal moves all extremities at baseline continues to ambulate in his wheelchair.  Neurologic is grossly intact speech is clear no lateralizing  findings.  Psych he is alert and oriented pleasant and appropriate      Labs reviewed: Recent Labs    10/09/17 0400  10/18/17 0709 10/23/17 0705 10/30/17 0747  NA  --    < > 133* 132* 135  K  --    < > 4.4 4.1 4.4  CL  --    < > 100* 99* 99*  CO2  --    < > 26 24 30   GLUCOSE  --    < > 249* 203* 155*  BUN  --    < > 24* 25* 28*  CREATININE  --    < > 1.00 1.08 0.92  CALCIUM  --    < > 8.5* 9.3 9.3  MG 1.7  --   --   --   --    < > = values in this interval not displayed.   Recent Labs    09/30/17 0704 10/17/17 0900 10/30/17 0747  AST 23 21 14*  ALT 12* 14* 17  ALKPHOS 141* 100 97  BILITOT 0.7 0.7 0.5  PROT 7.0 7.2 6.9  ALBUMIN 3.4* 3.0* 3.1*   Recent Labs    10/09/17 0400 10/17/17 0900 10/18/17 0709 10/23/17 0705 10/30/17 0747  WBC 5.5 8.2 7.1 7.8 9.3  NEUTROABS 3.4 5.6  --  5.1  --   HGB 10.9* 11.0* 9.7* 9.8* 11.8*  HCT 33.4* 34.9* 30.9* 30.6* 37.8*  MCV 96.8 97.8 97.8 94.7 98.2  PLT 160 202 207 263 272   No results found for: TSH Lab Results  Component Value Date   HGBA1C 6.0 (H) 08/12/2017   Lab Results  Component Value Date   CHOL 110 10/09/2017   HDL 48 10/09/2017   LDLCALC 41 10/09/2017   TRIG 107 10/09/2017   CHOLHDL 2.3 10/09/2017    Significant Diagnostic Results in last 30 days:  No results found.  Assessment/Plan  #1-history of suspected pneumonitis pneumonia with history of COPD with his history he was started on  treatment with Levaquin 500 mg a day through January 5-he also has Mucinex twice daily through January 3.  Has duo nebs as needed every 6 hours as needed these will need to be encouraged.  Clinically he feels he is doing well will add Flonase however secondary to continued nasal issues 1 spray twice daily to each nostril for 5 days.  -Continue to monitor vital signs and pulse ox every shift.  Also will update a CBC with differential and metabolic  panel with a history of his respiratory issues.  with recent  diagnosis of suspected pneumonitis.  [id number]

## 2017-11-24 ENCOUNTER — Encounter (HOSPITAL_COMMUNITY)
Admission: RE | Admit: 2017-11-24 | Discharge: 2017-11-24 | Disposition: A | Discharge status: Home / Self Care | Payer: Medicare HMO | Source: Skilled Nursing Facility | Attending: *Deleted | Admitting: *Deleted

## 2017-11-24 LAB — BASIC METABOLIC PANEL
ANION GAP: 12 (ref 5–15)
BUN: 26 mg/dL — ABNORMAL HIGH (ref 6–20)
CHLORIDE: 101 mmol/L (ref 101–111)
CO2: 21 mmol/L — ABNORMAL LOW (ref 22–32)
Calcium: 9.1 mg/dL (ref 8.9–10.3)
Creatinine, Ser: 1.02 mg/dL (ref 0.61–1.24)
Glucose, Bld: 195 mg/dL — ABNORMAL HIGH (ref 65–99)
POTASSIUM: 4.3 mmol/L (ref 3.5–5.1)
SODIUM: 134 mmol/L — AB (ref 135–145)

## 2017-11-24 LAB — CBC WITH DIFFERENTIAL/PLATELET
BASOS ABS: 0 10*3/uL (ref 0.0–0.1)
Basophils Relative: 1 %
EOS ABS: 0.4 10*3/uL (ref 0.0–0.7)
Eosinophils Relative: 6 %
HCT: 37.5 % — ABNORMAL LOW (ref 39.0–52.0)
HEMOGLOBIN: 12 g/dL — AB (ref 13.0–17.0)
LYMPHS ABS: 1.3 10*3/uL (ref 0.7–4.0)
Lymphocytes Relative: 21 %
MCH: 29.8 pg (ref 26.0–34.0)
MCHC: 32 g/dL (ref 30.0–36.0)
MCV: 93.1 fL (ref 78.0–100.0)
Monocytes Absolute: 0.7 10*3/uL (ref 0.1–1.0)
Monocytes Relative: 11 %
NEUTROS PCT: 61 %
Neutro Abs: 3.7 10*3/uL (ref 1.7–7.7)
Platelets: 144 10*3/uL — ABNORMAL LOW (ref 150–400)
RBC: 4.03 MIL/uL — AB (ref 4.22–5.81)
RDW: 14.9 % (ref 11.5–15.5)
WBC: 6 10*3/uL (ref 4.0–10.5)

## 2017-11-27 ENCOUNTER — Non-Acute Institutional Stay (SKILLED_NURSING_FACILITY): Payer: Medicare HMO | Admitting: Internal Medicine

## 2017-11-27 ENCOUNTER — Encounter: Payer: Self-pay | Admitting: Internal Medicine

## 2017-11-27 DIAGNOSIS — D62 Acute posthemorrhagic anemia: Secondary | ICD-10-CM

## 2017-11-27 DIAGNOSIS — Z967 Presence of other bone and tendon implants: Secondary | ICD-10-CM | POA: Diagnosis not present

## 2017-11-27 DIAGNOSIS — R221 Localized swelling, mass and lump, neck: Secondary | ICD-10-CM

## 2017-11-27 DIAGNOSIS — Z9889 Other specified postprocedural states: Secondary | ICD-10-CM

## 2017-11-27 DIAGNOSIS — Z8781 Personal history of (healed) traumatic fracture: Secondary | ICD-10-CM | POA: Diagnosis not present

## 2017-11-27 DIAGNOSIS — J189 Pneumonia, unspecified organism: Secondary | ICD-10-CM | POA: Diagnosis not present

## 2017-11-27 DIAGNOSIS — E118 Type 2 diabetes mellitus with unspecified complications: Secondary | ICD-10-CM | POA: Diagnosis not present

## 2017-11-27 DIAGNOSIS — R0981 Nasal congestion: Secondary | ICD-10-CM | POA: Diagnosis not present

## 2017-11-27 DIAGNOSIS — J392 Other diseases of pharynx: Secondary | ICD-10-CM

## 2017-11-27 NOTE — Progress Notes (Signed)
Location:   [facility] Room Number: 155/P Place of Service:  SNF (31) Provider:  Terisa Starr, MD  Patient Care Team: Sinda Du, MD as PCP - General (Internal Medicine)  Extended Emergency Contact Information Primary Emergency Contact: Choo,Chyrl Address: [address]          [place], [place] [address] [place] Phone: [phone] Mobile Phone: [phone] Relation: Spouse  Code Status:  Full Code Goals of care: Advanced Directive information Advanced Directives 11/27/2017  Does Patient Have a Medical Advance Directive? Yes  Type of Advance Directive (No Data)  Does patient want to make changes to medical advance directive? No - Patient declined  Would patient like information on creating a medical advance directive? No - Patient declined     Chief complaint-routine visit for medical management of chronic medical issues including history of right femur fracture with repair- type 2 diabetes-coronary artery disease status post stent-hypertension-BPH- anemia-.  Acute visit secondary to head congestion- with history of pneumonia  HPI:  Pt is a 82 y.o. male seen today for medical management of chronic diseases as noted above.  Most recent acute issue has been a complaint of some increased head congestion and cough-we did do a chest x-ray which was suspicious for possible pneumonitis.  Interstitial versus interstitial scarring pulmonary fibrosis-he is completing a course of Levaquin which will end tomorrow.  He says his breathing is stable he does have a history of COPD is on oxygen chronically.  He also was started on Mucinex as well as Flonase.  He says his breathing is doing well but is complaining of some continued head congestion sinus congestion.  He has been afebrile.  He also does have a history discovered during his recent hospitalization of an oropharyngeal mass-recommendation is for follow-up once he is  discharged.  Regards to his other issues he appears to be stable initially admitted here after sustaining a right femur fracture that was surgically repaired he appears to be doing relatively well with this has worked with therapy and is followed by orthopedics.  Once he was admitted he was readmitted secondary to concerns for pneumonia which was treated aggressively in the hospital he was discharged on Levaquin which he completed  He does have a history of type 2 diabetes on Glucophage blood sugars appear to be in the mid higher 100s in the morning later in the day appear to be more around the 200 range will update a hemoglobin A1c he is followed by endocrinology-hemoglobin A1c was 6.0 on lab done in September.  He also has a history of anemia he is responding well to iron hemoglobin at 12 on recent lab on November 24, 2017 showed stability.  He also had initially came in with a rash but this has significantly improved status post prednisone.  Regards to coronary artery disease he does have a history of stent placement continues on aspirin and a statin LDL was 41 on lab done in November 2018.  He also has a history of vitamin D deficiency on supplementation level was 46.5 on lab done in November.  Currently he is sitting in his wheelchair comfortably again does continue to complain of some sinus congestion but says he does not feel overtly ill but congestion does bother him  .     Past Medical History:  Diagnosis Date  . Arthritis   . Constipation   . COPD (chronic obstructive pulmonary disease) ([facility])   . Coronary artery  disease   . Diabetes mellitus   . Hypertension   . Skin cancer of face    Past Surgical History:  Procedure Laterality Date  . APPENDECTOMY  1958  . BACK SURGERY    . CORONARY STENT PLACEMENT    . FEMUR IM NAIL Right 08/23/2017   Procedure: INTRAMEDULLARY (IM) NAIL FEMORAL;  Surgeon: Nicholes Stairs, MD;  Location: [facility];  Service: Orthopedics;   Laterality: Right;  . SKIN CANCER EXCISION  07/2017    No Known Allergies  Outpatient Encounter Medications as of 11/27/2017  Medication Sig  . acetaminophen (TYLENOL) 325 MG tablet Take 650 mg by mouth every 6 (six) hours as needed.  Marland Kitchen aspirin EC 81 MG tablet Take 81 mg by mouth daily.   Roseanne Kaufman Peru-Castor Oil (VENELEX) OINT Apply to sacrum and bilateral buttocks q shift and prn erythema. Every shift  . cholecalciferol (VITAMIN D) 1000 units tablet Take 5,000 Units by mouth daily.  . ferrous sulfate (IRON SUPPLEMENT) 325 (65 FE) MG tablet Take 325 mg by mouth daily with breakfast.  . fluticasone (FLONASE) 50 MCG/ACT nasal spray Place 1 spray into both nostrils 2 (two) times daily.  Marland Kitchen gabapentin (NEURONTIN) 100 MG capsule Take 200 mg every morning by mouth.  . gabapentin (NEURONTIN) 300 MG capsule Take 1 capsule (300 mg total) by mouth at bedtime.  Marland Kitchen guaiFENesin (MUCINEX) 600 MG 12 hr tablet Take 600 mg by mouth 2 (two) times daily.  . insulin aspart (NOVOLOG FLEXPEN) 100 UNIT/ML FlexPen Inject into the skin 2 (two) times daily. sliding scale   . ipratropium-albuterol (DUONEB) 0.5-2.5 (3) MG/3ML SOLN Take 3 mLs by nebulization every 6 (six) hours as needed.  . Lactobacillus Rhamnosus, GG, ([employer] PROBIOTIC, LACTOBACILLUS,) CAPS Take 1 capsule by mouth twice a day.  . levofloxacin (LEVAQUIN) 500 MG tablet Take 500 mg by mouth daily.  . magnesium gluconate (MAGONATE) 500 MG tablet Take 500 mg by mouth daily.  . Melatonin 3 MG TABS Take 3 mg by mouth at bedtime.  . metFORMIN (GLUCOPHAGE) 1000 MG tablet Take 1 tablet (1,000 mg total) by mouth 2 (two) times daily with a meal.  . methocarbamol (ROBAXIN) 500 MG tablet Take 1 tablet (500 mg total) by mouth every 6 (six) hours as needed for muscle spasms.  . metoprolol tartrate (LOPRESSOR) 50 MG tablet Take 50 mg by mouth 2 (two) times daily.   . Misc Natural Products (OSTEO BI-FLEX ADV TRIPLE ST PO) Take 1 tablet by mouth once a day  . Multiple  Vitamin (MULTIVITAMIN WITH MINERALS) TABS tablet Take 1 tablet by mouth daily.  Marland Kitchen Phenyleph-CPM-DM-Aspirin (ALKA-SELTZER PLUS COLD & COUGH PO) Take 2 tablets by mouth daily as needed (nasal congestion).  . polyethylene glycol (MIRALAX / GLYCOLAX) packet Take 17 g by mouth daily.  . simvastatin (ZOCOR) 40 MG tablet Take 40 mg by mouth daily.  . tamsulosin (FLOMAX) 0.4 MG CAPS capsule Take 0.4 mg by mouth.  . trolamine salicylate (ASPERCREME) 10 % cream Apply 1 application topically as needed for muscle pain.   No facility-administered encounter medications on file as of 11/27/2017.      Review of Systems   In general is not complaining of any fever chills says he feels stable respiratory wise but does continue to have some nasal sinus congestion says the Flonase does help with the drainage.  Skin does not complain of diaphoresis the rash has significantly improved does not complain of itching.  Head ears eyes nose mouth and throat  is not complaining of sore throat just complain of nasal and sinus stuffiness.  Oropharynx is not complaining again of any sore throat he does have a history of oropharyngeal mass and will warrant follow-up.  Respiratory does not complain of increased shortness of breath he does have a history of COPD on chronic oxygen has a cough at times but is more concerned about his sinuses get congested.  He is completing a course of Levaquin for suspected pneumonia-pneumonitis.  Cardiac is not complaining of any chest pain has baseline lower extremity edema.  GI does not complain of abdominal discomfort nausea vomiting diarrhea constipation.  GU is not really complaining of dysuria does continue on Flomax with a history of retention.  Musculoskeletal is not complaining of joint pain currently ambulates largely in a wheelchair.  Neurologic I Does not complain of dizziness or syncope occasionally has a headache he attributes to sinus congestion is not complaining of any  numbness does have a history of neuropathy on Neurontin.   Psych is not complaining of any depression or anxiety appears to be in good spirits    Immunization History  Administered Date(s) Administered  . Influenza-Unspecified 08/11/2017  . Pneumococcal Conjugate-13 10/06/2017  . Tdap 10/06/2017   Pertinent  Health Maintenance Due  Topic Date Due  . FOOT EXAM  12/24/2017 (Originally 10/19/1946)  . OPHTHALMOLOGY EXAM  12/24/2017 (Originally 10/19/1946)  . URINE MICROALBUMIN  12/24/2017 (Originally 10/19/1946)  . HEMOGLOBIN A1C  02/09/2018  . PNA vac Low Risk Adult (2 of 2 - PPSV23) 10/06/2018  . INFLUENZA VACCINE  Completed   Fall Risk  02/13/2017 05/06/2016 02/02/2016 10/24/2015  Falls in the past year? No No No No   Functional Status Survey:    Vitals:   11/27/17 1226  BP: 137/63  Pulse: 88  Resp: 18  Temp: (!) 97 F (36.1 C)  TempSrc: Oral  SpO2: 93%  Weight: 172 lb 12.8 oz (78.4 kg)  Height: 6' (1.829 m)   Body mass index is 23.44 kg/m. Physical Exam   In general this is a pleasant elderly male in no distress sitting comfortably in his wheelchair.  His skin is warm and dry.  Rash appears essentially resolved.  Eyes sclera and conjunctive are clear may have a slight amount of watery drainage visual acuity appears grossly intact.  Oropharynx is clear mucous membranes moist  Nose I cannot appreciate any active drainage  Chest there is no labored breathing he has a small amount of expiratory rhonchi intermittent.  Abdomen is soft nontender with positive bowel sounds.  Musculoskeletal does move all extremities at baseline ambulates largely wheelchair at this point he does work with therapy and has gained strength.  Neurologic is grossly intact his speech is clear no lateralizing findings.  Psych he is alert and oriented pleasant and appropriate  Labs reviewed: Recent Labs    10/09/17 0400  10/23/17 0705 10/30/17 0747 11/24/17 0300  NA  --    < > 132*  135 134*  K  --    < > 4.1 4.4 4.3  CL  --    < > 99* 99* 101  CO2  --    < > 24 30 21*  GLUCOSE  --    < > 203* 155* 195*  BUN  --    < > 25* 28* 26*  CREATININE  --    < > 1.08 0.92 1.02  CALCIUM  --    < > 9.3 9.3 9.1  MG 1.7  --   --   --   --    < > =  values in this interval not displayed.   Recent Labs    09/30/17 0704 10/17/17 0900 10/30/17 0747  AST 23 21 14*  ALT 12* 14* 17  ALKPHOS 141* 100 97  BILITOT 0.7 0.7 0.5  PROT 7.0 7.2 6.9  ALBUMIN 3.4* 3.0* 3.1*   Recent Labs    10/17/17 0900  10/23/17 0705 10/30/17 0747 11/24/17 0300  WBC 8.2   < > 7.8 9.3 6.0  NEUTROABS 5.6  --  5.1  --  3.7  HGB 11.0*   < > 9.8* 11.8* 12.0*  HCT 34.9*   < > 30.6* 37.8* 37.5*  MCV 97.8   < > 94.7 98.2 93.1  PLT 202   < > 263 272 144*   < > = values in this interval not displayed.   No results found for: TSH Lab Results  Component Value Date   HGBA1C 6.0 (H) 08/12/2017   Lab Results  Component Value Date   CHOL 110 10/09/2017   HDL 48 10/09/2017   LDLCALC 41 10/09/2017   TRIG 107 10/09/2017   CHOLHDL 2.3 10/09/2017    Significant Diagnostic Results in last 30 days:  No results found.  Assessment/Plan  #1-history of sinus congestion-pneumonia-she is completing a course of Levaquin says his respiratory status is stable as well as cough- conti he also has orders for duo nebs as needed nues to have some sinus congestion-he says he benefited from the Flonase will extend this for 5 additional days.  Also will extend the Mucinex for 5 additional days.  He also continues on duo nebs as needed  This was discussed with Dr. Lyndel Safe as well and will order CT scan of his chest and sinuses secondary to the chronic sinusitis and congestion.  Also will order an ENT consult secondary to the oropharyngeal mass-recommendation was to have visualization of this.  Clinically he does not appear to be in stable this will have to be watched and will await further investigational  studies.  2. History of right femur fracture surgically repaired he appears to be fairly stable with this he is followed by orthopedics--pain at this point appears to be controlled.  3.  History of diabetes type 2 he is on Glucophage has somewhat elevated readings later in the day hemoglobin A1c in September was satisfactory at 6.0 will update a hemoglobin A1c follow-up with endocrinology as needed.  4.  History of coronary artery disease status post stent placement this appears stable he does not complain of sinus area.    [id number] note greater than 45 minutes this dictation-reviewing his chart-reviewing his labs pain-he is on aspirin as well as a statin again LDL was 41 on lab done in November  #5 history of anemia he is on iron hemoglobin shows stability at 12.0 on lab done November 24, 2017 will monitor periodically.  6.-History of neuropathy he continues on Neurontin suspect this is secondary to diabetes this appears to be controlled currently.  7.  History of low magnesium he is on supplementation magnesium level was 1.7 on recent lab will update this to ensure stability.  8.  History of vitamin D deficiency he is on supplementation vitamin D level again was 46.5 on lab done in November.  9.  History of BPH he is on Flomax he does not complain of urinary issues today.  10.  History of oropharyngeal mass again as noted above will order ENT consult with suggestion for direct visualization of this also we have ordered a CT scan  of the chest and sinus area.  [id number] of note greater than 45 minutes spent assessing patient-reviewing his chart-reviewing his labs discussing his status with nursing staff as well as with Dr Lyndel Safe- and coordinating and formulating a plan of care-of note greater than 50% of time spent coordinating plan of care with input as noted above

## 2017-11-28 ENCOUNTER — Encounter (HOSPITAL_COMMUNITY)
Admission: RE | Admit: 2017-11-28 | Discharge: 2017-11-28 | Disposition: A | Discharge status: Home / Self Care | Payer: Medicare HMO | Source: Skilled Nursing Facility | Attending: Internal Medicine | Admitting: Internal Medicine

## 2017-11-28 DIAGNOSIS — Z4789 Encounter for other orthopedic aftercare: Secondary | ICD-10-CM | POA: Insufficient documentation

## 2017-11-28 DIAGNOSIS — R278 Other lack of coordination: Secondary | ICD-10-CM | POA: Insufficient documentation

## 2017-11-28 DIAGNOSIS — E871 Hypo-osmolality and hyponatremia: Secondary | ICD-10-CM | POA: Insufficient documentation

## 2017-11-28 DIAGNOSIS — D72829 Elevated white blood cell count, unspecified: Secondary | ICD-10-CM | POA: Insufficient documentation

## 2017-11-28 DIAGNOSIS — J189 Pneumonia, unspecified organism: Secondary | ICD-10-CM | POA: Insufficient documentation

## 2017-11-28 LAB — HEMOGLOBIN A1C
HEMOGLOBIN A1C: 7.5 % — AB (ref 4.8–5.6)
MEAN PLASMA GLUCOSE: 168.55 mg/dL

## 2017-11-28 LAB — MAGNESIUM: MAGNESIUM: 1.8 mg/dL (ref 1.7–2.4)

## 2017-12-01 DIAGNOSIS — E1140 Type 2 diabetes mellitus with diabetic neuropathy, unspecified: Secondary | ICD-10-CM | POA: Diagnosis not present

## 2017-12-01 DIAGNOSIS — E1151 Type 2 diabetes mellitus with diabetic peripheral angiopathy without gangrene: Secondary | ICD-10-CM | POA: Diagnosis not present

## 2017-12-05 ENCOUNTER — Encounter: Payer: Self-pay | Admitting: Internal Medicine

## 2017-12-05 ENCOUNTER — Non-Acute Institutional Stay (SKILLED_NURSING_FACILITY): Payer: Medicare HMO | Admitting: Internal Medicine

## 2017-12-05 DIAGNOSIS — J42 Unspecified chronic bronchitis: Secondary | ICD-10-CM | POA: Diagnosis not present

## 2017-12-05 DIAGNOSIS — I2510 Atherosclerotic heart disease of native coronary artery without angina pectoris: Secondary | ICD-10-CM | POA: Diagnosis not present

## 2017-12-05 DIAGNOSIS — Z8781 Personal history of (healed) traumatic fracture: Secondary | ICD-10-CM | POA: Diagnosis not present

## 2017-12-05 DIAGNOSIS — E871 Hypo-osmolality and hyponatremia: Secondary | ICD-10-CM | POA: Diagnosis not present

## 2017-12-05 DIAGNOSIS — J189 Pneumonia, unspecified organism: Secondary | ICD-10-CM

## 2017-12-05 DIAGNOSIS — Z967 Presence of other bone and tendon implants: Secondary | ICD-10-CM | POA: Diagnosis not present

## 2017-12-05 DIAGNOSIS — E118 Type 2 diabetes mellitus with unspecified complications: Secondary | ICD-10-CM | POA: Diagnosis not present

## 2017-12-05 DIAGNOSIS — Z9889 Other specified postprocedural states: Secondary | ICD-10-CM

## 2017-12-05 NOTE — Progress Notes (Signed)
Location:   [facility] Room Number: 155/P Place of Service:  SNF (31)  Provider: Granville Lewis  PCP: Sinda Du, MD Patient Care Team: Sinda Du, MD as PCP - General (Internal Medicine)  Extended Emergency Contact Information Primary Emergency Contact: Dubreuil,Chyrl Address: [address]          [place], [place] [address] Johnnette Litter of [place] Phone: [phone] Mobile Phone: [phone] Relation: Spouse  Code Status: Full Code Goals of care:  Advanced Directive information Advanced Directives 12/05/2017  Does Patient Have a Medical Advance Directive? Yes  Type of Advance Directive (No Data)  Does patient want to make changes to medical advance directive? No - Patient declined  Would patient like information on creating a medical advance directive? No - Patient declined     No Known Allergies  Chief Complaint  Patient presents with  . Discharge Note    Discharge Visit    HPI:  82 y.o. male seen today for discharge from facility most likely on Monday.  He recently came here after sustaining a right hip fracture that was surgically repaired.  This appears to be stable he is followed by orthopedics.  He was readmitted to the hospital with pneumonia was discharged to [facility] which he completed- is also completed another course of Levaquin for suspected pneumonitis- versus interstitial scarring--says he is breathing is stable-.  He does complain of somewhat chronic sinus congestion- and he does have a ear nose mouth and throat consult once he is discharged.  We have also ordered a CT scan of his chest and sinuses which apparently will be done on Monday.  His other medical issues appear to be relatively stable he does have a history of type 2 diabetes and sugars have been up somewhat he is on Glucophage as well as NovoLog sliding scale twice a day- hemoglobin A1c was 7.5 on recent lab which is up from recent baseline sugars in the  morning appear to be more on the higher 100s and later in the day averaging around 200 he is followed by endocrinology \\He  does have a history of coronary artery disease which has been stable he does have a history of stenting he is on aspirin as well as a statin LDL was 41 on lab done in November.  He also has a history of anemia which actually appears to be improved he is on iron hemoglobin was 12 on lab in late December.  In regards to COPD he is on oxygen O2 sat was 88% at rest 97% on O2 at 2 L-  .  Currently he is sitting in his wheelchair comfortably has no complaints continues to be bright alert-vital signs appear to be stable    Past Medical History:  Diagnosis Date  . Arthritis   . Constipation   . COPD (chronic obstructive pulmonary disease) ([facility])   . Coronary artery disease   . Diabetes mellitus   . Hypertension   . Skin cancer of face     Past Surgical History:  Procedure Laterality Date  . APPENDECTOMY  1958  . BACK SURGERY    . CORONARY STENT PLACEMENT    . FEMUR IM NAIL Right 08/23/2017   Procedure: INTRAMEDULLARY (IM) NAIL FEMORAL;  Surgeon: Nicholes Stairs, MD;  Location: [facility];  Service: Orthopedics;  Laterality: Right;  . SKIN CANCER EXCISION  07/2017      reports that he has been smoking cigarettes.  He has a 25.00 pack-year smoking history. he  has never used smokeless tobacco. He reports that he does not drink alcohol or use drugs. Social History   Socioeconomic History  . Marital status: Married    Spouse name: Not on file  . Number of children: Not on file  . Years of education: Not on file  . Highest education level: Not on file  Social Needs  . Financial resource strain: Not on file  . Food insecurity - worry: Not on file  . Food insecurity - inability: Not on file  . Transportation needs - medical: Not on file  . Transportation needs - non-medical: Not on file  Occupational History  . Not on file  Tobacco Use  . Smoking status:  Current Every Day Smoker    Packs/day: 1.00    Years: 25.00    Pack years: 25.00    Types: Cigarettes  . Smokeless tobacco: Never Used  Substance and Sexual Activity  . Alcohol use: No  . Drug use: No  . Sexual activity: Not on file  Other Topics Concern  . Not on file  Social History Narrative  . Not on file   Functional Status Survey:    No Known Allergies  Pertinent  Health Maintenance Due  Topic Date Due  . FOOT EXAM  12/24/2017 (Originally 10/19/1946)  . OPHTHALMOLOGY EXAM  12/24/2017 (Originally 10/19/1946)  . URINE MICROALBUMIN  12/24/2017 (Originally 10/19/1946)  . HEMOGLOBIN A1C  05/28/2018  . PNA vac Low Risk Adult (2 of 2 - PPSV23) 10/06/2018  . INFLUENZA VACCINE  Completed    Medications: Outpatient Encounter Medications as of 12/05/2017  Medication Sig  . acetaminophen (TYLENOL) 325 MG tablet Take 650 mg by mouth every 6 (six) hours as needed.  Marland Kitchen aspirin EC 81 MG tablet Take 81 mg by mouth daily.   Roseanne Kaufman Peru-Castor Oil (VENELEX) OINT Apply to sacrum and bilateral buttocks q shift and prn erythema. Every shift  . cholecalciferol (VITAMIN D) 1000 units tablet Take 5,000 Units by mouth daily.  . ferrous sulfate (IRON SUPPLEMENT) 325 (65 FE) MG tablet Take 325 mg by mouth daily with breakfast.  . gabapentin (NEURONTIN) 100 MG capsule Take 200 mg every morning by mouth.  . gabapentin (NEURONTIN) 300 MG capsule Take 1 capsule (300 mg total) by mouth at bedtime.  . insulin aspart (NOVOLOG FLEXPEN) 100 UNIT/ML FlexPen Inject into the skin 2 (two) times daily. sliding scale   . ipratropium-albuterol (DUONEB) 0.5-2.5 (3) MG/3ML SOLN Take 3 mLs by nebulization every 6 (six) hours as needed.  . magnesium gluconate (MAGONATE) 500 MG tablet Take 500 mg by mouth daily.  . Melatonin 3 MG TABS Take 3 mg by mouth at bedtime.  . metFORMIN (GLUCOPHAGE) 1000 MG tablet Take 1 tablet (1,000 mg total) by mouth 2 (two) times daily with a meal.  . methocarbamol (ROBAXIN) 500 MG  tablet Take 1 tablet (500 mg total) by mouth every 6 (six) hours as needed for muscle spasms.  . metoprolol tartrate (LOPRESSOR) 50 MG tablet Take 50 mg by mouth 2 (two) times daily.   . Misc Natural Products (OSTEO BI-FLEX ADV TRIPLE ST PO) Take 1 tablet by mouth once a day  . Multiple Vitamin (MULTIVITAMIN WITH MINERALS) TABS tablet Take 1 tablet by mouth daily.  Marland Kitchen Phenyleph-CPM-DM-Aspirin (ALKA-SELTZER PLUS COLD & COUGH PO) Take 2 tablets by mouth daily as needed (nasal congestion).  . polyethylene glycol (MIRALAX / GLYCOLAX) packet Take 17 g by mouth daily.  . simvastatin (ZOCOR) 40 MG tablet Take 40 mg  by mouth daily.  . tamsulosin (FLOMAX) 0.4 MG CAPS capsule Take 0.4 mg by mouth.  . trolamine salicylate (ASPERCREME) 10 % cream Apply 1 application topically as needed for muscle pain.  . [DISCONTINUED] fluticasone (FLONASE) 50 MCG/ACT nasal spray Place 1 spray into both nostrils 2 (two) times daily.  . [DISCONTINUED] guaiFENesin (MUCINEX) 600 MG 12 hr tablet Take 600 mg by mouth 2 (two) times daily.  . [DISCONTINUED] Lactobacillus Rhamnosus, GG, ([employer] PROBIOTIC, LACTOBACILLUS,) CAPS Take 1 capsule by mouth twice a day.  . [DISCONTINUED] levofloxacin (LEVAQUIN) 500 MG tablet Take 500 mg by mouth daily.   No facility-administered encounter medications on file as of 12/05/2017.      Review of Systems   In general is not complaining of any fever chills he has gained weight I suspect this is secondary to better appetite.  Skin does not complain of rashes or itching was treated initially for a fairly significant rash which appears to have resolved.  He did receive prednisone.  Head ears eyes nose mouth and throat does complain of some chronic sinus congestion does not really complain of significant headache however or discomfort does not complain of visual changes.  Respiratory is not complaining of shortness of breath or increased cough from baseline does have a history of COPD is on oxygen  to keep sats above 90%.  Cardiac is not complaining of any chest pain or palpitations has scant lower extremity edema.  GI is not complaining of abdominal discomfort nausea vomiting diarrhea constipation.  GU does not complain of dysuria does have a history of urinary retention on Flomax.  Musculoskeletal is status post right hip fracture with repair but does not really complain of joint pain at this time.  Neurologic does not complain of dizziness or headache does have some history of neuropathy on Neurontin but does not complain of that this morning.  Psych does not complain of any significant anxiety or depression appears to be in good spirits  Vitals:   12/05/17 1124  BP: (!) 117/56  Pulse: 71  Resp: 20  Temp: (!) 97.4 F (36.3 C)  TempSrc: Oral  SpO2: 97%  Weight: 183 lb (83 kg)  Height: 6' (1.829 m)   Body mass index is 24.82 kg/m. Physical Exam  Labs reviewed: Basic Metabolic Panel: Recent Labs    10/09/17 0400  10/23/17 0705 10/30/17 0747 11/24/17 0300 11/28/17 0712  NA  --    < > 132* 135 134*  --   K  --    < > 4.1 4.4 4.3  --   CL  --    < > 99* 99* 101  --   CO2  --    < > 24 30 21*  --   GLUCOSE  --    < > 203* 155* 195*  --   BUN  --    < > 25* 28* 26*  --   CREATININE  --    < > 1.08 0.92 1.02  --   CALCIUM  --    < > 9.3 9.3 9.1  --   MG 1.7  --   --   --   --  1.8   < > = values in this interval not displayed.   Liver Function Tests: Recent Labs    09/30/17 0704 10/17/17 0900 10/30/17 0747  AST 23 21 14*  ALT 12* 14* 17  ALKPHOS 141* 100 97  BILITOT 0.7 0.7 0.5  PROT 7.0 7.2 6.9  ALBUMIN 3.4* 3.0* 3.1*   No results for input(s): LIPASE, AMYLASE in the last 8760 hours. No results for input(s): AMMONIA in the last 8760 hours. CBC: Recent Labs    10/17/17 0900  10/23/17 0705 10/30/17 0747 11/24/17 0300  WBC 8.2   < > 7.8 9.3 6.0  NEUTROABS 5.6  --  5.1  --  3.7  HGB 11.0*   < > 9.8* 11.8* 12.0*  HCT 34.9*   < > 30.6* 37.8* 37.5*   MCV 97.8   < > 94.7 98.2 93.1  PLT 202   < > 263 272 144*   < > = values in this interval not displayed.   Cardiac Enzymes: Recent Labs    10/17/17 0900 10/17/17 1153  TROPONINI 0.05* 0.05*   BNP: Invalid input(s): POCBNP CBG: Recent Labs    10/20/17 2112 10/21/17 0724 10/21/17 1146  GLUCAP 201* 181* 227*    Procedures and Imaging Studies During Stay: No results found.  Assessment/Plan:     #1-history of right femur fracture he is followed by orthopedics to be doing well in this regards he will need PT and OT when he goes home as well as a bedside to help with ambulation also nursing support for his multiple medical issues he does continue on Robaxin as needed.  2.  History of chronic sinusitis-again a CT scan of his sinuses and chest is scheduled for next Monday-also has a ENT consult pending shortly.  This appears to be relatively stable is not complaining of any shortness of breath or significant headache- Again he will be getting she scan of his chest on Monday.    #3-history of pneumonia again he is completed a couple courses of Levaquin this appears to be stable with history of COPD is on \ oxygen to keep sats above 90%-he does have duo nebs as needed   --#4-history of type 2 diabetes he is on Glucophage as well as NovoLog sliding scale twice a day sugars are Somewhat elevated later in the day around 200 averaging- hemoglobin A1c has risen somewhat-will defer to endocrinology since he is about to go home would be  hesitant to change medications at this point  #5-history of oropharyngeal  mass discovered incidentally during his hospitalization- recommendation for follow-up by ENT for direct visualization and  this has been arranged   #6-history of coronary artery disease this is been stable during his stay here he does have a history of stenting he is on aspirin as well as a statin LDL was 41 on lab done in November.  7.-History of hypertension this appears  stable on Lopressor recent blood pressures  117/56 -121/63-109/62  #8- anemia-this appears actually improved on iron will update this before discharge.  9.  History of mild hyponatremia will update this as well sodium was 134 on lab in late December.    #10 history of vitamin D deficiency--this is stable level was 46.5 on lab done in November.  11.  History of low magnesium he is on supplementation magnesium level showed stability at 1.8 on lab done on November 28, 2016.  Again will update a CBC and BMP before discharge with history of anemia and hyponatremia-he also will have follow-up by ENT for oropharyngeal mass and will have a CT scan of his sinuses and chest.  Also will need PT and OT with history of hip fracture repair as well as nursing to follow his multiple medical issues he will need home oxygen saturation secondary to desaturation off  oxygen.  Also will need a bedside commode secondary to continued weakness status post hip fracture repair.  [id number] note greater than 30 minutes spent on this discharge summary-greater than 50% of time spent coordinating a plan of care for numerous diagnoses   Is on supplementation     t

## 2017-12-07 ENCOUNTER — Encounter (HOSPITAL_COMMUNITY)
Admission: RE | Admit: 2017-12-07 | Discharge: 2017-12-07 | Disposition: A | Discharge status: Home / Self Care | Payer: Medicare HMO | Source: Skilled Nursing Facility | Attending: *Deleted | Admitting: *Deleted

## 2017-12-07 LAB — BASIC METABOLIC PANEL
Anion gap: 8 (ref 5–15)
BUN: 26 mg/dL — AB (ref 6–20)
CHLORIDE: 103 mmol/L (ref 101–111)
CO2: 26 mmol/L (ref 22–32)
Calcium: 8.9 mg/dL (ref 8.9–10.3)
Creatinine, Ser: 0.95 mg/dL (ref 0.61–1.24)
GFR calc Af Amer: >60 mL/min (ref >60)
GFR calc non Af Amer: >60 mL/min (ref >60)
Glucose, Bld: 189 mg/dL — ABNORMAL HIGH (ref 65–99)
POTASSIUM: 4.8 mmol/L (ref 3.5–5.1)
Sodium: 137 mmol/L (ref 135–145)

## 2017-12-07 LAB — CBC WITH DIFFERENTIAL/PLATELET
Basophils Absolute: 0 10*3/uL (ref 0.0–0.1)
Basophils Relative: 1 %
EOS PCT: 7 %
Eosinophils Absolute: 0.4 10*3/uL (ref 0.0–0.7)
HEMATOCRIT: 35.2 % — AB (ref 39.0–52.0)
Hemoglobin: 11.1 g/dL — ABNORMAL LOW (ref 13.0–17.0)
LYMPHS ABS: 1.9 10*3/uL (ref 0.7–4.0)
LYMPHS PCT: 33 %
MCH: 29.6 pg (ref 26.0–34.0)
MCHC: 31.5 g/dL (ref 30.0–36.0)
MCV: 93.9 fL (ref 78.0–100.0)
Monocytes Absolute: 0.5 10*3/uL (ref 0.1–1.0)
Monocytes Relative: 9 %
NEUTROS ABS: 3 10*3/uL (ref 1.7–7.7)
Neutrophils Relative %: 50 %
Platelets: 160 10*3/uL (ref 150–400)
RBC: 3.75 MIL/uL — ABNORMAL LOW (ref 4.22–5.81)
RDW: 14.8 % (ref 11.5–15.5)
WBC: 5.9 10*3/uL (ref 4.0–10.5)

## 2017-12-08 ENCOUNTER — Ambulatory Visit (HOSPITAL_COMMUNITY)
Admit: 2017-12-08 | Discharge: 2017-12-08 | Disposition: A | Discharge status: Home / Self Care | Payer: Medicare HMO | Source: Ambulatory Visit | Attending: Internal Medicine | Admitting: Internal Medicine

## 2017-12-08 ENCOUNTER — Ambulatory Visit (HOSPITAL_COMMUNITY): Payer: Medicare HMO

## 2017-12-08 DIAGNOSIS — J328 Other chronic sinusitis: Secondary | ICD-10-CM | POA: Diagnosis not present

## 2017-12-08 DIAGNOSIS — K229 Disease of esophagus, unspecified: Secondary | ICD-10-CM | POA: Insufficient documentation

## 2017-12-08 DIAGNOSIS — I700 Atherosclerosis of aorta: Secondary | ICD-10-CM | POA: Diagnosis not present

## 2017-12-08 DIAGNOSIS — I7789 Other specified disorders of arteries and arterioles: Secondary | ICD-10-CM | POA: Insufficient documentation

## 2017-12-08 DIAGNOSIS — J439 Emphysema, unspecified: Secondary | ICD-10-CM | POA: Diagnosis not present

## 2017-12-08 DIAGNOSIS — I2510 Atherosclerotic heart disease of native coronary artery without angina pectoris: Secondary | ICD-10-CM | POA: Diagnosis not present

## 2017-12-08 DIAGNOSIS — R590 Localized enlarged lymph nodes: Secondary | ICD-10-CM | POA: Insufficient documentation

## 2017-12-08 DIAGNOSIS — J189 Pneumonia, unspecified organism: Secondary | ICD-10-CM | POA: Diagnosis not present

## 2017-12-08 MED ORDER — IOPAMIDOL (ISOVUE-300) INJECTION 61%
75.0000 mL | Freq: Once | INTRAVENOUS | Status: AC | PRN
  Administered 2017-12-08: 75 mL via INTRAVENOUS

## 2017-12-09 ENCOUNTER — Other Ambulatory Visit: Payer: Self-pay

## 2017-12-09 DIAGNOSIS — I2510 Atherosclerotic heart disease of native coronary artery without angina pectoris: Secondary | ICD-10-CM | POA: Diagnosis not present

## 2017-12-09 DIAGNOSIS — I10 Essential (primary) hypertension: Secondary | ICD-10-CM | POA: Diagnosis not present

## 2017-12-09 DIAGNOSIS — S72141D Displaced intertrochanteric fracture of right femur, subsequent encounter for closed fracture with routine healing: Secondary | ICD-10-CM | POA: Diagnosis not present

## 2017-12-09 DIAGNOSIS — J329 Chronic sinusitis, unspecified: Secondary | ICD-10-CM | POA: Diagnosis not present

## 2017-12-09 DIAGNOSIS — M1991 Primary osteoarthritis, unspecified site: Secondary | ICD-10-CM | POA: Diagnosis not present

## 2017-12-09 DIAGNOSIS — E119 Type 2 diabetes mellitus without complications: Secondary | ICD-10-CM | POA: Diagnosis not present

## 2017-12-09 DIAGNOSIS — D649 Anemia, unspecified: Secondary | ICD-10-CM | POA: Diagnosis not present

## 2017-12-09 DIAGNOSIS — R69 Illness, unspecified: Secondary | ICD-10-CM | POA: Diagnosis not present

## 2017-12-09 DIAGNOSIS — Z9981 Dependence on supplemental oxygen: Secondary | ICD-10-CM | POA: Diagnosis not present

## 2017-12-09 DIAGNOSIS — J449 Chronic obstructive pulmonary disease, unspecified: Secondary | ICD-10-CM | POA: Diagnosis not present

## 2017-12-09 MED ORDER — BLOOD GLUCOSE MONITOR KIT: 1.0000 | PACK | Freq: Four times a day (QID) | 0 refills | Status: DC

## 2017-12-10 DIAGNOSIS — S72141D Displaced intertrochanteric fracture of right femur, subsequent encounter for closed fracture with routine healing: Secondary | ICD-10-CM | POA: Diagnosis not present

## 2017-12-10 DIAGNOSIS — R69 Illness, unspecified: Secondary | ICD-10-CM | POA: Diagnosis not present

## 2017-12-10 DIAGNOSIS — D649 Anemia, unspecified: Secondary | ICD-10-CM | POA: Diagnosis not present

## 2017-12-10 DIAGNOSIS — I2510 Atherosclerotic heart disease of native coronary artery without angina pectoris: Secondary | ICD-10-CM | POA: Diagnosis not present

## 2017-12-10 DIAGNOSIS — I10 Essential (primary) hypertension: Secondary | ICD-10-CM | POA: Diagnosis not present

## 2017-12-10 DIAGNOSIS — J329 Chronic sinusitis, unspecified: Secondary | ICD-10-CM | POA: Diagnosis not present

## 2017-12-10 DIAGNOSIS — E119 Type 2 diabetes mellitus without complications: Secondary | ICD-10-CM | POA: Diagnosis not present

## 2017-12-10 DIAGNOSIS — J449 Chronic obstructive pulmonary disease, unspecified: Secondary | ICD-10-CM | POA: Diagnosis not present

## 2017-12-10 DIAGNOSIS — M1991 Primary osteoarthritis, unspecified site: Secondary | ICD-10-CM | POA: Diagnosis not present

## 2017-12-10 DIAGNOSIS — Z9981 Dependence on supplemental oxygen: Secondary | ICD-10-CM | POA: Diagnosis not present

## 2017-12-11 DIAGNOSIS — J329 Chronic sinusitis, unspecified: Secondary | ICD-10-CM | POA: Diagnosis not present

## 2017-12-11 DIAGNOSIS — I10 Essential (primary) hypertension: Secondary | ICD-10-CM | POA: Diagnosis not present

## 2017-12-11 DIAGNOSIS — E119 Type 2 diabetes mellitus without complications: Secondary | ICD-10-CM | POA: Diagnosis not present

## 2017-12-11 DIAGNOSIS — R69 Illness, unspecified: Secondary | ICD-10-CM | POA: Diagnosis not present

## 2017-12-11 DIAGNOSIS — S72141D Displaced intertrochanteric fracture of right femur, subsequent encounter for closed fracture with routine healing: Secondary | ICD-10-CM | POA: Diagnosis not present

## 2017-12-11 DIAGNOSIS — J449 Chronic obstructive pulmonary disease, unspecified: Secondary | ICD-10-CM | POA: Diagnosis not present

## 2017-12-11 DIAGNOSIS — I2510 Atherosclerotic heart disease of native coronary artery without angina pectoris: Secondary | ICD-10-CM | POA: Diagnosis not present

## 2017-12-11 DIAGNOSIS — D649 Anemia, unspecified: Secondary | ICD-10-CM | POA: Diagnosis not present

## 2017-12-11 DIAGNOSIS — Z9981 Dependence on supplemental oxygen: Secondary | ICD-10-CM | POA: Diagnosis not present

## 2017-12-11 DIAGNOSIS — M1991 Primary osteoarthritis, unspecified site: Secondary | ICD-10-CM | POA: Diagnosis not present

## 2017-12-12 DIAGNOSIS — I10 Essential (primary) hypertension: Secondary | ICD-10-CM | POA: Diagnosis not present

## 2017-12-12 DIAGNOSIS — J449 Chronic obstructive pulmonary disease, unspecified: Secondary | ICD-10-CM | POA: Diagnosis not present

## 2017-12-12 DIAGNOSIS — I2510 Atherosclerotic heart disease of native coronary artery without angina pectoris: Secondary | ICD-10-CM | POA: Diagnosis not present

## 2017-12-12 DIAGNOSIS — E119 Type 2 diabetes mellitus without complications: Secondary | ICD-10-CM | POA: Diagnosis not present

## 2017-12-12 DIAGNOSIS — S72141D Displaced intertrochanteric fracture of right femur, subsequent encounter for closed fracture with routine healing: Secondary | ICD-10-CM | POA: Diagnosis not present

## 2017-12-12 DIAGNOSIS — R69 Illness, unspecified: Secondary | ICD-10-CM | POA: Diagnosis not present

## 2017-12-12 DIAGNOSIS — D649 Anemia, unspecified: Secondary | ICD-10-CM | POA: Diagnosis not present

## 2017-12-12 DIAGNOSIS — M1991 Primary osteoarthritis, unspecified site: Secondary | ICD-10-CM | POA: Diagnosis not present

## 2017-12-12 DIAGNOSIS — S7221XD Displaced subtrochanteric fracture of right femur, subsequent encounter for closed fracture with routine healing: Secondary | ICD-10-CM | POA: Diagnosis not present

## 2017-12-12 DIAGNOSIS — Z9981 Dependence on supplemental oxygen: Secondary | ICD-10-CM | POA: Diagnosis not present

## 2017-12-12 DIAGNOSIS — J329 Chronic sinusitis, unspecified: Secondary | ICD-10-CM | POA: Diagnosis not present

## 2017-12-15 DIAGNOSIS — J449 Chronic obstructive pulmonary disease, unspecified: Secondary | ICD-10-CM | POA: Diagnosis not present

## 2017-12-15 DIAGNOSIS — D649 Anemia, unspecified: Secondary | ICD-10-CM | POA: Diagnosis not present

## 2017-12-15 DIAGNOSIS — M1991 Primary osteoarthritis, unspecified site: Secondary | ICD-10-CM | POA: Diagnosis not present

## 2017-12-15 DIAGNOSIS — S72141D Displaced intertrochanteric fracture of right femur, subsequent encounter for closed fracture with routine healing: Secondary | ICD-10-CM | POA: Diagnosis not present

## 2017-12-15 DIAGNOSIS — E119 Type 2 diabetes mellitus without complications: Secondary | ICD-10-CM | POA: Diagnosis not present

## 2017-12-15 DIAGNOSIS — I2510 Atherosclerotic heart disease of native coronary artery without angina pectoris: Secondary | ICD-10-CM | POA: Diagnosis not present

## 2017-12-15 DIAGNOSIS — R69 Illness, unspecified: Secondary | ICD-10-CM | POA: Diagnosis not present

## 2017-12-15 DIAGNOSIS — I10 Essential (primary) hypertension: Secondary | ICD-10-CM | POA: Diagnosis not present

## 2017-12-15 DIAGNOSIS — Z9981 Dependence on supplemental oxygen: Secondary | ICD-10-CM | POA: Diagnosis not present

## 2017-12-15 DIAGNOSIS — J329 Chronic sinusitis, unspecified: Secondary | ICD-10-CM | POA: Diagnosis not present

## 2017-12-16 DIAGNOSIS — S72141D Displaced intertrochanteric fracture of right femur, subsequent encounter for closed fracture with routine healing: Secondary | ICD-10-CM | POA: Diagnosis not present

## 2017-12-16 DIAGNOSIS — Z9981 Dependence on supplemental oxygen: Secondary | ICD-10-CM | POA: Diagnosis not present

## 2017-12-16 DIAGNOSIS — M1991 Primary osteoarthritis, unspecified site: Secondary | ICD-10-CM | POA: Diagnosis not present

## 2017-12-16 DIAGNOSIS — D649 Anemia, unspecified: Secondary | ICD-10-CM | POA: Diagnosis not present

## 2017-12-16 DIAGNOSIS — I10 Essential (primary) hypertension: Secondary | ICD-10-CM | POA: Diagnosis not present

## 2017-12-16 DIAGNOSIS — R69 Illness, unspecified: Secondary | ICD-10-CM | POA: Diagnosis not present

## 2017-12-16 DIAGNOSIS — I2510 Atherosclerotic heart disease of native coronary artery without angina pectoris: Secondary | ICD-10-CM | POA: Diagnosis not present

## 2017-12-16 DIAGNOSIS — J329 Chronic sinusitis, unspecified: Secondary | ICD-10-CM | POA: Diagnosis not present

## 2017-12-16 DIAGNOSIS — E119 Type 2 diabetes mellitus without complications: Secondary | ICD-10-CM | POA: Diagnosis not present

## 2017-12-16 DIAGNOSIS — J449 Chronic obstructive pulmonary disease, unspecified: Secondary | ICD-10-CM | POA: Diagnosis not present

## 2017-12-17 DIAGNOSIS — J449 Chronic obstructive pulmonary disease, unspecified: Secondary | ICD-10-CM | POA: Diagnosis not present

## 2017-12-17 DIAGNOSIS — J329 Chronic sinusitis, unspecified: Secondary | ICD-10-CM | POA: Diagnosis not present

## 2017-12-17 DIAGNOSIS — D649 Anemia, unspecified: Secondary | ICD-10-CM | POA: Diagnosis not present

## 2017-12-17 DIAGNOSIS — I10 Essential (primary) hypertension: Secondary | ICD-10-CM | POA: Diagnosis not present

## 2017-12-17 DIAGNOSIS — M1991 Primary osteoarthritis, unspecified site: Secondary | ICD-10-CM | POA: Diagnosis not present

## 2017-12-17 DIAGNOSIS — I2510 Atherosclerotic heart disease of native coronary artery without angina pectoris: Secondary | ICD-10-CM | POA: Diagnosis not present

## 2017-12-17 DIAGNOSIS — Z9981 Dependence on supplemental oxygen: Secondary | ICD-10-CM | POA: Diagnosis not present

## 2017-12-17 DIAGNOSIS — R69 Illness, unspecified: Secondary | ICD-10-CM | POA: Diagnosis not present

## 2017-12-17 DIAGNOSIS — S72141D Displaced intertrochanteric fracture of right femur, subsequent encounter for closed fracture with routine healing: Secondary | ICD-10-CM | POA: Diagnosis not present

## 2017-12-17 DIAGNOSIS — E119 Type 2 diabetes mellitus without complications: Secondary | ICD-10-CM | POA: Diagnosis not present

## 2017-12-18 DIAGNOSIS — D649 Anemia, unspecified: Secondary | ICD-10-CM | POA: Diagnosis not present

## 2017-12-18 DIAGNOSIS — Z9981 Dependence on supplemental oxygen: Secondary | ICD-10-CM | POA: Diagnosis not present

## 2017-12-18 DIAGNOSIS — E119 Type 2 diabetes mellitus without complications: Secondary | ICD-10-CM | POA: Diagnosis not present

## 2017-12-18 DIAGNOSIS — M1991 Primary osteoarthritis, unspecified site: Secondary | ICD-10-CM | POA: Diagnosis not present

## 2017-12-18 DIAGNOSIS — I2510 Atherosclerotic heart disease of native coronary artery without angina pectoris: Secondary | ICD-10-CM | POA: Diagnosis not present

## 2017-12-18 DIAGNOSIS — S72141D Displaced intertrochanteric fracture of right femur, subsequent encounter for closed fracture with routine healing: Secondary | ICD-10-CM | POA: Diagnosis not present

## 2017-12-18 DIAGNOSIS — J449 Chronic obstructive pulmonary disease, unspecified: Secondary | ICD-10-CM | POA: Diagnosis not present

## 2017-12-18 DIAGNOSIS — J329 Chronic sinusitis, unspecified: Secondary | ICD-10-CM | POA: Diagnosis not present

## 2017-12-18 DIAGNOSIS — I10 Essential (primary) hypertension: Secondary | ICD-10-CM | POA: Diagnosis not present

## 2017-12-18 DIAGNOSIS — R69 Illness, unspecified: Secondary | ICD-10-CM | POA: Diagnosis not present

## 2017-12-19 DIAGNOSIS — M1991 Primary osteoarthritis, unspecified site: Secondary | ICD-10-CM | POA: Diagnosis not present

## 2017-12-19 DIAGNOSIS — S72141D Displaced intertrochanteric fracture of right femur, subsequent encounter for closed fracture with routine healing: Secondary | ICD-10-CM | POA: Diagnosis not present

## 2017-12-19 DIAGNOSIS — J449 Chronic obstructive pulmonary disease, unspecified: Secondary | ICD-10-CM | POA: Diagnosis not present

## 2017-12-19 DIAGNOSIS — I2510 Atherosclerotic heart disease of native coronary artery without angina pectoris: Secondary | ICD-10-CM | POA: Diagnosis not present

## 2017-12-19 DIAGNOSIS — I10 Essential (primary) hypertension: Secondary | ICD-10-CM | POA: Diagnosis not present

## 2017-12-19 DIAGNOSIS — J329 Chronic sinusitis, unspecified: Secondary | ICD-10-CM | POA: Diagnosis not present

## 2017-12-19 DIAGNOSIS — D649 Anemia, unspecified: Secondary | ICD-10-CM | POA: Diagnosis not present

## 2017-12-19 DIAGNOSIS — Z9981 Dependence on supplemental oxygen: Secondary | ICD-10-CM | POA: Diagnosis not present

## 2017-12-19 DIAGNOSIS — E119 Type 2 diabetes mellitus without complications: Secondary | ICD-10-CM | POA: Diagnosis not present

## 2017-12-19 DIAGNOSIS — R69 Illness, unspecified: Secondary | ICD-10-CM | POA: Diagnosis not present

## 2017-12-22 DIAGNOSIS — J449 Chronic obstructive pulmonary disease, unspecified: Secondary | ICD-10-CM | POA: Diagnosis not present

## 2017-12-22 DIAGNOSIS — M1991 Primary osteoarthritis, unspecified site: Secondary | ICD-10-CM | POA: Diagnosis not present

## 2017-12-22 DIAGNOSIS — D649 Anemia, unspecified: Secondary | ICD-10-CM | POA: Diagnosis not present

## 2017-12-22 DIAGNOSIS — R69 Illness, unspecified: Secondary | ICD-10-CM | POA: Diagnosis not present

## 2017-12-22 DIAGNOSIS — I2510 Atherosclerotic heart disease of native coronary artery without angina pectoris: Secondary | ICD-10-CM | POA: Diagnosis not present

## 2017-12-22 DIAGNOSIS — S72141D Displaced intertrochanteric fracture of right femur, subsequent encounter for closed fracture with routine healing: Secondary | ICD-10-CM | POA: Diagnosis not present

## 2017-12-22 DIAGNOSIS — J329 Chronic sinusitis, unspecified: Secondary | ICD-10-CM | POA: Diagnosis not present

## 2017-12-22 DIAGNOSIS — Z9981 Dependence on supplemental oxygen: Secondary | ICD-10-CM | POA: Diagnosis not present

## 2017-12-22 DIAGNOSIS — I10 Essential (primary) hypertension: Secondary | ICD-10-CM | POA: Diagnosis not present

## 2017-12-22 DIAGNOSIS — E119 Type 2 diabetes mellitus without complications: Secondary | ICD-10-CM | POA: Diagnosis not present

## 2017-12-23 DIAGNOSIS — Z9981 Dependence on supplemental oxygen: Secondary | ICD-10-CM | POA: Diagnosis not present

## 2017-12-23 DIAGNOSIS — M1991 Primary osteoarthritis, unspecified site: Secondary | ICD-10-CM | POA: Diagnosis not present

## 2017-12-23 DIAGNOSIS — D649 Anemia, unspecified: Secondary | ICD-10-CM | POA: Diagnosis not present

## 2017-12-23 DIAGNOSIS — I10 Essential (primary) hypertension: Secondary | ICD-10-CM | POA: Diagnosis not present

## 2017-12-23 DIAGNOSIS — J329 Chronic sinusitis, unspecified: Secondary | ICD-10-CM | POA: Diagnosis not present

## 2017-12-23 DIAGNOSIS — E119 Type 2 diabetes mellitus without complications: Secondary | ICD-10-CM | POA: Diagnosis not present

## 2017-12-23 DIAGNOSIS — J449 Chronic obstructive pulmonary disease, unspecified: Secondary | ICD-10-CM | POA: Diagnosis not present

## 2017-12-23 DIAGNOSIS — I2510 Atherosclerotic heart disease of native coronary artery without angina pectoris: Secondary | ICD-10-CM | POA: Diagnosis not present

## 2017-12-23 DIAGNOSIS — R69 Illness, unspecified: Secondary | ICD-10-CM | POA: Diagnosis not present

## 2017-12-23 DIAGNOSIS — S72141D Displaced intertrochanteric fracture of right femur, subsequent encounter for closed fracture with routine healing: Secondary | ICD-10-CM | POA: Diagnosis not present

## 2017-12-24 DIAGNOSIS — S72141D Displaced intertrochanteric fracture of right femur, subsequent encounter for closed fracture with routine healing: Secondary | ICD-10-CM | POA: Diagnosis not present

## 2017-12-24 DIAGNOSIS — D649 Anemia, unspecified: Secondary | ICD-10-CM | POA: Diagnosis not present

## 2017-12-24 DIAGNOSIS — J329 Chronic sinusitis, unspecified: Secondary | ICD-10-CM | POA: Diagnosis not present

## 2017-12-24 DIAGNOSIS — M1991 Primary osteoarthritis, unspecified site: Secondary | ICD-10-CM | POA: Diagnosis not present

## 2017-12-24 DIAGNOSIS — I10 Essential (primary) hypertension: Secondary | ICD-10-CM | POA: Diagnosis not present

## 2017-12-24 DIAGNOSIS — R69 Illness, unspecified: Secondary | ICD-10-CM | POA: Diagnosis not present

## 2017-12-24 DIAGNOSIS — E119 Type 2 diabetes mellitus without complications: Secondary | ICD-10-CM | POA: Diagnosis not present

## 2017-12-24 DIAGNOSIS — I2510 Atherosclerotic heart disease of native coronary artery without angina pectoris: Secondary | ICD-10-CM | POA: Diagnosis not present

## 2017-12-24 DIAGNOSIS — J449 Chronic obstructive pulmonary disease, unspecified: Secondary | ICD-10-CM | POA: Diagnosis not present

## 2017-12-24 DIAGNOSIS — Z9981 Dependence on supplemental oxygen: Secondary | ICD-10-CM | POA: Diagnosis not present

## 2017-12-25 DIAGNOSIS — J329 Chronic sinusitis, unspecified: Secondary | ICD-10-CM | POA: Diagnosis not present

## 2017-12-25 DIAGNOSIS — R69 Illness, unspecified: Secondary | ICD-10-CM | POA: Diagnosis not present

## 2017-12-25 DIAGNOSIS — J449 Chronic obstructive pulmonary disease, unspecified: Secondary | ICD-10-CM | POA: Diagnosis not present

## 2017-12-25 DIAGNOSIS — I10 Essential (primary) hypertension: Secondary | ICD-10-CM | POA: Diagnosis not present

## 2017-12-25 DIAGNOSIS — E119 Type 2 diabetes mellitus without complications: Secondary | ICD-10-CM | POA: Diagnosis not present

## 2017-12-25 DIAGNOSIS — M1991 Primary osteoarthritis, unspecified site: Secondary | ICD-10-CM | POA: Diagnosis not present

## 2017-12-25 DIAGNOSIS — D649 Anemia, unspecified: Secondary | ICD-10-CM | POA: Diagnosis not present

## 2017-12-25 DIAGNOSIS — Z9981 Dependence on supplemental oxygen: Secondary | ICD-10-CM | POA: Diagnosis not present

## 2017-12-25 DIAGNOSIS — I2510 Atherosclerotic heart disease of native coronary artery without angina pectoris: Secondary | ICD-10-CM | POA: Diagnosis not present

## 2017-12-25 DIAGNOSIS — S72141D Displaced intertrochanteric fracture of right femur, subsequent encounter for closed fracture with routine healing: Secondary | ICD-10-CM | POA: Diagnosis not present

## 2017-12-26 DIAGNOSIS — M1991 Primary osteoarthritis, unspecified site: Secondary | ICD-10-CM | POA: Diagnosis not present

## 2017-12-26 DIAGNOSIS — J329 Chronic sinusitis, unspecified: Secondary | ICD-10-CM | POA: Diagnosis not present

## 2017-12-26 DIAGNOSIS — I10 Essential (primary) hypertension: Secondary | ICD-10-CM | POA: Diagnosis not present

## 2017-12-26 DIAGNOSIS — I2510 Atherosclerotic heart disease of native coronary artery without angina pectoris: Secondary | ICD-10-CM | POA: Diagnosis not present

## 2017-12-26 DIAGNOSIS — S72141D Displaced intertrochanteric fracture of right femur, subsequent encounter for closed fracture with routine healing: Secondary | ICD-10-CM | POA: Diagnosis not present

## 2017-12-26 DIAGNOSIS — Z9981 Dependence on supplemental oxygen: Secondary | ICD-10-CM | POA: Diagnosis not present

## 2017-12-26 DIAGNOSIS — D649 Anemia, unspecified: Secondary | ICD-10-CM | POA: Diagnosis not present

## 2017-12-26 DIAGNOSIS — R69 Illness, unspecified: Secondary | ICD-10-CM | POA: Diagnosis not present

## 2017-12-26 DIAGNOSIS — J449 Chronic obstructive pulmonary disease, unspecified: Secondary | ICD-10-CM | POA: Diagnosis not present

## 2017-12-26 DIAGNOSIS — S72001A Fracture of unspecified part of neck of right femur, initial encounter for closed fracture: Secondary | ICD-10-CM | POA: Diagnosis not present

## 2017-12-26 DIAGNOSIS — E119 Type 2 diabetes mellitus without complications: Secondary | ICD-10-CM | POA: Diagnosis not present

## 2017-12-29 DIAGNOSIS — I2510 Atherosclerotic heart disease of native coronary artery without angina pectoris: Secondary | ICD-10-CM | POA: Diagnosis not present

## 2017-12-29 DIAGNOSIS — M1991 Primary osteoarthritis, unspecified site: Secondary | ICD-10-CM | POA: Diagnosis not present

## 2017-12-29 DIAGNOSIS — Z9981 Dependence on supplemental oxygen: Secondary | ICD-10-CM | POA: Diagnosis not present

## 2017-12-29 DIAGNOSIS — D649 Anemia, unspecified: Secondary | ICD-10-CM | POA: Diagnosis not present

## 2017-12-29 DIAGNOSIS — I10 Essential (primary) hypertension: Secondary | ICD-10-CM | POA: Diagnosis not present

## 2017-12-29 DIAGNOSIS — R69 Illness, unspecified: Secondary | ICD-10-CM | POA: Diagnosis not present

## 2017-12-29 DIAGNOSIS — E119 Type 2 diabetes mellitus without complications: Secondary | ICD-10-CM | POA: Diagnosis not present

## 2017-12-29 DIAGNOSIS — J449 Chronic obstructive pulmonary disease, unspecified: Secondary | ICD-10-CM | POA: Diagnosis not present

## 2017-12-29 DIAGNOSIS — S72141D Displaced intertrochanteric fracture of right femur, subsequent encounter for closed fracture with routine healing: Secondary | ICD-10-CM | POA: Diagnosis not present

## 2017-12-29 DIAGNOSIS — J329 Chronic sinusitis, unspecified: Secondary | ICD-10-CM | POA: Diagnosis not present

## 2017-12-30 DIAGNOSIS — S72141D Displaced intertrochanteric fracture of right femur, subsequent encounter for closed fracture with routine healing: Secondary | ICD-10-CM | POA: Diagnosis not present

## 2017-12-30 DIAGNOSIS — J449 Chronic obstructive pulmonary disease, unspecified: Secondary | ICD-10-CM | POA: Diagnosis not present

## 2017-12-30 DIAGNOSIS — I10 Essential (primary) hypertension: Secondary | ICD-10-CM | POA: Diagnosis not present

## 2017-12-30 DIAGNOSIS — I2510 Atherosclerotic heart disease of native coronary artery without angina pectoris: Secondary | ICD-10-CM | POA: Diagnosis not present

## 2017-12-30 DIAGNOSIS — D649 Anemia, unspecified: Secondary | ICD-10-CM | POA: Diagnosis not present

## 2017-12-30 DIAGNOSIS — E119 Type 2 diabetes mellitus without complications: Secondary | ICD-10-CM | POA: Diagnosis not present

## 2017-12-30 DIAGNOSIS — M1991 Primary osteoarthritis, unspecified site: Secondary | ICD-10-CM | POA: Diagnosis not present

## 2017-12-30 DIAGNOSIS — Z9981 Dependence on supplemental oxygen: Secondary | ICD-10-CM | POA: Diagnosis not present

## 2017-12-30 DIAGNOSIS — R69 Illness, unspecified: Secondary | ICD-10-CM | POA: Diagnosis not present

## 2017-12-30 DIAGNOSIS — J329 Chronic sinusitis, unspecified: Secondary | ICD-10-CM | POA: Diagnosis not present

## 2017-12-31 DIAGNOSIS — D649 Anemia, unspecified: Secondary | ICD-10-CM | POA: Diagnosis not present

## 2017-12-31 DIAGNOSIS — M1991 Primary osteoarthritis, unspecified site: Secondary | ICD-10-CM | POA: Diagnosis not present

## 2017-12-31 DIAGNOSIS — E119 Type 2 diabetes mellitus without complications: Secondary | ICD-10-CM | POA: Diagnosis not present

## 2017-12-31 DIAGNOSIS — I2510 Atherosclerotic heart disease of native coronary artery without angina pectoris: Secondary | ICD-10-CM | POA: Diagnosis not present

## 2017-12-31 DIAGNOSIS — J449 Chronic obstructive pulmonary disease, unspecified: Secondary | ICD-10-CM | POA: Diagnosis not present

## 2017-12-31 DIAGNOSIS — Z9981 Dependence on supplemental oxygen: Secondary | ICD-10-CM | POA: Diagnosis not present

## 2017-12-31 DIAGNOSIS — S72141D Displaced intertrochanteric fracture of right femur, subsequent encounter for closed fracture with routine healing: Secondary | ICD-10-CM | POA: Diagnosis not present

## 2017-12-31 DIAGNOSIS — R69 Illness, unspecified: Secondary | ICD-10-CM | POA: Diagnosis not present

## 2017-12-31 DIAGNOSIS — I10 Essential (primary) hypertension: Secondary | ICD-10-CM | POA: Diagnosis not present

## 2017-12-31 DIAGNOSIS — J329 Chronic sinusitis, unspecified: Secondary | ICD-10-CM | POA: Diagnosis not present

## 2018-01-02 DIAGNOSIS — S72141D Displaced intertrochanteric fracture of right femur, subsequent encounter for closed fracture with routine healing: Secondary | ICD-10-CM | POA: Diagnosis not present

## 2018-01-02 DIAGNOSIS — M1991 Primary osteoarthritis, unspecified site: Secondary | ICD-10-CM | POA: Diagnosis not present

## 2018-01-02 DIAGNOSIS — R69 Illness, unspecified: Secondary | ICD-10-CM | POA: Diagnosis not present

## 2018-01-02 DIAGNOSIS — Z9981 Dependence on supplemental oxygen: Secondary | ICD-10-CM | POA: Diagnosis not present

## 2018-01-02 DIAGNOSIS — J449 Chronic obstructive pulmonary disease, unspecified: Secondary | ICD-10-CM | POA: Diagnosis not present

## 2018-01-02 DIAGNOSIS — J329 Chronic sinusitis, unspecified: Secondary | ICD-10-CM | POA: Diagnosis not present

## 2018-01-02 DIAGNOSIS — I10 Essential (primary) hypertension: Secondary | ICD-10-CM | POA: Diagnosis not present

## 2018-01-02 DIAGNOSIS — E119 Type 2 diabetes mellitus without complications: Secondary | ICD-10-CM | POA: Diagnosis not present

## 2018-01-02 DIAGNOSIS — I2510 Atherosclerotic heart disease of native coronary artery without angina pectoris: Secondary | ICD-10-CM | POA: Diagnosis not present

## 2018-01-02 DIAGNOSIS — D649 Anemia, unspecified: Secondary | ICD-10-CM | POA: Diagnosis not present

## 2018-01-05 DIAGNOSIS — E119 Type 2 diabetes mellitus without complications: Secondary | ICD-10-CM | POA: Diagnosis not present

## 2018-01-05 DIAGNOSIS — M1991 Primary osteoarthritis, unspecified site: Secondary | ICD-10-CM | POA: Diagnosis not present

## 2018-01-05 DIAGNOSIS — Z9981 Dependence on supplemental oxygen: Secondary | ICD-10-CM | POA: Diagnosis not present

## 2018-01-05 DIAGNOSIS — I2510 Atherosclerotic heart disease of native coronary artery without angina pectoris: Secondary | ICD-10-CM | POA: Diagnosis not present

## 2018-01-05 DIAGNOSIS — J449 Chronic obstructive pulmonary disease, unspecified: Secondary | ICD-10-CM | POA: Diagnosis not present

## 2018-01-05 DIAGNOSIS — I10 Essential (primary) hypertension: Secondary | ICD-10-CM | POA: Diagnosis not present

## 2018-01-05 DIAGNOSIS — R69 Illness, unspecified: Secondary | ICD-10-CM | POA: Diagnosis not present

## 2018-01-05 DIAGNOSIS — J329 Chronic sinusitis, unspecified: Secondary | ICD-10-CM | POA: Diagnosis not present

## 2018-01-05 DIAGNOSIS — S72141D Displaced intertrochanteric fracture of right femur, subsequent encounter for closed fracture with routine healing: Secondary | ICD-10-CM | POA: Diagnosis not present

## 2018-01-05 DIAGNOSIS — D649 Anemia, unspecified: Secondary | ICD-10-CM | POA: Diagnosis not present

## 2018-01-07 DIAGNOSIS — E119 Type 2 diabetes mellitus without complications: Secondary | ICD-10-CM | POA: Diagnosis not present

## 2018-01-07 DIAGNOSIS — S72141D Displaced intertrochanteric fracture of right femur, subsequent encounter for closed fracture with routine healing: Secondary | ICD-10-CM | POA: Diagnosis not present

## 2018-01-07 DIAGNOSIS — D649 Anemia, unspecified: Secondary | ICD-10-CM | POA: Diagnosis not present

## 2018-01-07 DIAGNOSIS — I2510 Atherosclerotic heart disease of native coronary artery without angina pectoris: Secondary | ICD-10-CM | POA: Diagnosis not present

## 2018-01-07 DIAGNOSIS — J329 Chronic sinusitis, unspecified: Secondary | ICD-10-CM | POA: Diagnosis not present

## 2018-01-07 DIAGNOSIS — Z9981 Dependence on supplemental oxygen: Secondary | ICD-10-CM | POA: Diagnosis not present

## 2018-01-07 DIAGNOSIS — R69 Illness, unspecified: Secondary | ICD-10-CM | POA: Diagnosis not present

## 2018-01-07 DIAGNOSIS — J449 Chronic obstructive pulmonary disease, unspecified: Secondary | ICD-10-CM | POA: Diagnosis not present

## 2018-01-07 DIAGNOSIS — I10 Essential (primary) hypertension: Secondary | ICD-10-CM | POA: Diagnosis not present

## 2018-01-07 DIAGNOSIS — M1991 Primary osteoarthritis, unspecified site: Secondary | ICD-10-CM | POA: Diagnosis not present

## 2018-01-12 DIAGNOSIS — J329 Chronic sinusitis, unspecified: Secondary | ICD-10-CM | POA: Diagnosis not present

## 2018-01-12 DIAGNOSIS — J449 Chronic obstructive pulmonary disease, unspecified: Secondary | ICD-10-CM | POA: Diagnosis not present

## 2018-01-12 DIAGNOSIS — R69 Illness, unspecified: Secondary | ICD-10-CM | POA: Diagnosis not present

## 2018-01-12 DIAGNOSIS — I2510 Atherosclerotic heart disease of native coronary artery without angina pectoris: Secondary | ICD-10-CM | POA: Diagnosis not present

## 2018-01-12 DIAGNOSIS — E119 Type 2 diabetes mellitus without complications: Secondary | ICD-10-CM | POA: Diagnosis not present

## 2018-01-12 DIAGNOSIS — Z9981 Dependence on supplemental oxygen: Secondary | ICD-10-CM | POA: Diagnosis not present

## 2018-01-12 DIAGNOSIS — D649 Anemia, unspecified: Secondary | ICD-10-CM | POA: Diagnosis not present

## 2018-01-12 DIAGNOSIS — S72141D Displaced intertrochanteric fracture of right femur, subsequent encounter for closed fracture with routine healing: Secondary | ICD-10-CM | POA: Diagnosis not present

## 2018-01-12 DIAGNOSIS — I10 Essential (primary) hypertension: Secondary | ICD-10-CM | POA: Diagnosis not present

## 2018-01-12 DIAGNOSIS — M1991 Primary osteoarthritis, unspecified site: Secondary | ICD-10-CM | POA: Diagnosis not present

## 2018-01-14 DIAGNOSIS — Z9981 Dependence on supplemental oxygen: Secondary | ICD-10-CM | POA: Diagnosis not present

## 2018-01-14 DIAGNOSIS — E119 Type 2 diabetes mellitus without complications: Secondary | ICD-10-CM | POA: Diagnosis not present

## 2018-01-14 DIAGNOSIS — I10 Essential (primary) hypertension: Secondary | ICD-10-CM | POA: Diagnosis not present

## 2018-01-14 DIAGNOSIS — D649 Anemia, unspecified: Secondary | ICD-10-CM | POA: Diagnosis not present

## 2018-01-14 DIAGNOSIS — I2510 Atherosclerotic heart disease of native coronary artery without angina pectoris: Secondary | ICD-10-CM | POA: Diagnosis not present

## 2018-01-14 DIAGNOSIS — S72141D Displaced intertrochanteric fracture of right femur, subsequent encounter for closed fracture with routine healing: Secondary | ICD-10-CM | POA: Diagnosis not present

## 2018-01-14 DIAGNOSIS — M1991 Primary osteoarthritis, unspecified site: Secondary | ICD-10-CM | POA: Diagnosis not present

## 2018-01-14 DIAGNOSIS — R69 Illness, unspecified: Secondary | ICD-10-CM | POA: Diagnosis not present

## 2018-01-14 DIAGNOSIS — J449 Chronic obstructive pulmonary disease, unspecified: Secondary | ICD-10-CM | POA: Diagnosis not present

## 2018-01-14 DIAGNOSIS — J329 Chronic sinusitis, unspecified: Secondary | ICD-10-CM | POA: Diagnosis not present

## 2018-01-19 DIAGNOSIS — M1991 Primary osteoarthritis, unspecified site: Secondary | ICD-10-CM | POA: Diagnosis not present

## 2018-01-19 DIAGNOSIS — J329 Chronic sinusitis, unspecified: Secondary | ICD-10-CM | POA: Diagnosis not present

## 2018-01-19 DIAGNOSIS — I2510 Atherosclerotic heart disease of native coronary artery without angina pectoris: Secondary | ICD-10-CM | POA: Diagnosis not present

## 2018-01-19 DIAGNOSIS — I10 Essential (primary) hypertension: Secondary | ICD-10-CM | POA: Diagnosis not present

## 2018-01-19 DIAGNOSIS — Z9981 Dependence on supplemental oxygen: Secondary | ICD-10-CM | POA: Diagnosis not present

## 2018-01-19 DIAGNOSIS — E119 Type 2 diabetes mellitus without complications: Secondary | ICD-10-CM | POA: Diagnosis not present

## 2018-01-19 DIAGNOSIS — S72141D Displaced intertrochanteric fracture of right femur, subsequent encounter for closed fracture with routine healing: Secondary | ICD-10-CM | POA: Diagnosis not present

## 2018-01-19 DIAGNOSIS — R69 Illness, unspecified: Secondary | ICD-10-CM | POA: Diagnosis not present

## 2018-01-19 DIAGNOSIS — J449 Chronic obstructive pulmonary disease, unspecified: Secondary | ICD-10-CM | POA: Diagnosis not present

## 2018-01-19 DIAGNOSIS — D649 Anemia, unspecified: Secondary | ICD-10-CM | POA: Diagnosis not present

## 2018-01-21 DIAGNOSIS — I10 Essential (primary) hypertension: Secondary | ICD-10-CM | POA: Diagnosis not present

## 2018-01-21 DIAGNOSIS — J449 Chronic obstructive pulmonary disease, unspecified: Secondary | ICD-10-CM | POA: Diagnosis not present

## 2018-01-21 DIAGNOSIS — I2510 Atherosclerotic heart disease of native coronary artery without angina pectoris: Secondary | ICD-10-CM | POA: Diagnosis not present

## 2018-01-21 DIAGNOSIS — E119 Type 2 diabetes mellitus without complications: Secondary | ICD-10-CM | POA: Diagnosis not present

## 2018-01-21 DIAGNOSIS — M1991 Primary osteoarthritis, unspecified site: Secondary | ICD-10-CM | POA: Diagnosis not present

## 2018-01-21 DIAGNOSIS — J329 Chronic sinusitis, unspecified: Secondary | ICD-10-CM | POA: Diagnosis not present

## 2018-01-21 DIAGNOSIS — Z9981 Dependence on supplemental oxygen: Secondary | ICD-10-CM | POA: Diagnosis not present

## 2018-01-21 DIAGNOSIS — S72141D Displaced intertrochanteric fracture of right femur, subsequent encounter for closed fracture with routine healing: Secondary | ICD-10-CM | POA: Diagnosis not present

## 2018-01-21 DIAGNOSIS — D649 Anemia, unspecified: Secondary | ICD-10-CM | POA: Diagnosis not present

## 2018-01-21 DIAGNOSIS — R69 Illness, unspecified: Secondary | ICD-10-CM | POA: Diagnosis not present

## 2018-01-26 DIAGNOSIS — D649 Anemia, unspecified: Secondary | ICD-10-CM | POA: Diagnosis not present

## 2018-01-26 DIAGNOSIS — M1991 Primary osteoarthritis, unspecified site: Secondary | ICD-10-CM | POA: Diagnosis not present

## 2018-01-26 DIAGNOSIS — J449 Chronic obstructive pulmonary disease, unspecified: Secondary | ICD-10-CM | POA: Diagnosis not present

## 2018-01-26 DIAGNOSIS — E119 Type 2 diabetes mellitus without complications: Secondary | ICD-10-CM | POA: Diagnosis not present

## 2018-01-26 DIAGNOSIS — I10 Essential (primary) hypertension: Secondary | ICD-10-CM | POA: Diagnosis not present

## 2018-01-26 DIAGNOSIS — I2510 Atherosclerotic heart disease of native coronary artery without angina pectoris: Secondary | ICD-10-CM | POA: Diagnosis not present

## 2018-01-26 DIAGNOSIS — Z9981 Dependence on supplemental oxygen: Secondary | ICD-10-CM | POA: Diagnosis not present

## 2018-01-26 DIAGNOSIS — S72141D Displaced intertrochanteric fracture of right femur, subsequent encounter for closed fracture with routine healing: Secondary | ICD-10-CM | POA: Diagnosis not present

## 2018-01-26 DIAGNOSIS — J329 Chronic sinusitis, unspecified: Secondary | ICD-10-CM | POA: Diagnosis not present

## 2018-01-26 DIAGNOSIS — R69 Illness, unspecified: Secondary | ICD-10-CM | POA: Diagnosis not present

## 2018-01-28 DIAGNOSIS — J329 Chronic sinusitis, unspecified: Secondary | ICD-10-CM | POA: Diagnosis not present

## 2018-01-28 DIAGNOSIS — Z9981 Dependence on supplemental oxygen: Secondary | ICD-10-CM | POA: Diagnosis not present

## 2018-01-28 DIAGNOSIS — I10 Essential (primary) hypertension: Secondary | ICD-10-CM | POA: Diagnosis not present

## 2018-01-28 DIAGNOSIS — D649 Anemia, unspecified: Secondary | ICD-10-CM | POA: Diagnosis not present

## 2018-01-28 DIAGNOSIS — S72141D Displaced intertrochanteric fracture of right femur, subsequent encounter for closed fracture with routine healing: Secondary | ICD-10-CM | POA: Diagnosis not present

## 2018-01-28 DIAGNOSIS — E119 Type 2 diabetes mellitus without complications: Secondary | ICD-10-CM | POA: Diagnosis not present

## 2018-01-28 DIAGNOSIS — I2510 Atherosclerotic heart disease of native coronary artery without angina pectoris: Secondary | ICD-10-CM | POA: Diagnosis not present

## 2018-01-28 DIAGNOSIS — M1991 Primary osteoarthritis, unspecified site: Secondary | ICD-10-CM | POA: Diagnosis not present

## 2018-01-28 DIAGNOSIS — J449 Chronic obstructive pulmonary disease, unspecified: Secondary | ICD-10-CM | POA: Diagnosis not present

## 2018-01-28 DIAGNOSIS — R69 Illness, unspecified: Secondary | ICD-10-CM | POA: Diagnosis not present

## 2018-02-02 DIAGNOSIS — M1991 Primary osteoarthritis, unspecified site: Secondary | ICD-10-CM | POA: Diagnosis not present

## 2018-02-02 DIAGNOSIS — R69 Illness, unspecified: Secondary | ICD-10-CM | POA: Diagnosis not present

## 2018-02-02 DIAGNOSIS — I2510 Atherosclerotic heart disease of native coronary artery without angina pectoris: Secondary | ICD-10-CM | POA: Diagnosis not present

## 2018-02-02 DIAGNOSIS — Z9981 Dependence on supplemental oxygen: Secondary | ICD-10-CM | POA: Diagnosis not present

## 2018-02-02 DIAGNOSIS — I10 Essential (primary) hypertension: Secondary | ICD-10-CM | POA: Diagnosis not present

## 2018-02-02 DIAGNOSIS — S72141D Displaced intertrochanteric fracture of right femur, subsequent encounter for closed fracture with routine healing: Secondary | ICD-10-CM | POA: Diagnosis not present

## 2018-02-02 DIAGNOSIS — J449 Chronic obstructive pulmonary disease, unspecified: Secondary | ICD-10-CM | POA: Diagnosis not present

## 2018-02-02 DIAGNOSIS — E119 Type 2 diabetes mellitus without complications: Secondary | ICD-10-CM | POA: Diagnosis not present

## 2018-02-02 DIAGNOSIS — D649 Anemia, unspecified: Secondary | ICD-10-CM | POA: Diagnosis not present

## 2018-02-02 DIAGNOSIS — J329 Chronic sinusitis, unspecified: Secondary | ICD-10-CM | POA: Diagnosis not present

## 2018-02-07 DIAGNOSIS — R69 Illness, unspecified: Secondary | ICD-10-CM | POA: Diagnosis not present

## 2018-02-07 DIAGNOSIS — I2510 Atherosclerotic heart disease of native coronary artery without angina pectoris: Secondary | ICD-10-CM | POA: Diagnosis not present

## 2018-02-07 DIAGNOSIS — S72141D Displaced intertrochanteric fracture of right femur, subsequent encounter for closed fracture with routine healing: Secondary | ICD-10-CM | POA: Diagnosis not present

## 2018-02-07 DIAGNOSIS — E119 Type 2 diabetes mellitus without complications: Secondary | ICD-10-CM | POA: Diagnosis not present

## 2018-02-07 DIAGNOSIS — J329 Chronic sinusitis, unspecified: Secondary | ICD-10-CM | POA: Diagnosis not present

## 2018-02-07 DIAGNOSIS — J449 Chronic obstructive pulmonary disease, unspecified: Secondary | ICD-10-CM | POA: Diagnosis not present

## 2018-02-07 DIAGNOSIS — D649 Anemia, unspecified: Secondary | ICD-10-CM | POA: Diagnosis not present

## 2018-02-07 DIAGNOSIS — M1991 Primary osteoarthritis, unspecified site: Secondary | ICD-10-CM | POA: Diagnosis not present

## 2018-02-07 DIAGNOSIS — I10 Essential (primary) hypertension: Secondary | ICD-10-CM | POA: Diagnosis not present

## 2018-02-07 DIAGNOSIS — Z9981 Dependence on supplemental oxygen: Secondary | ICD-10-CM | POA: Diagnosis not present

## 2018-02-09 DIAGNOSIS — J329 Chronic sinusitis, unspecified: Secondary | ICD-10-CM | POA: Diagnosis not present

## 2018-02-09 DIAGNOSIS — R69 Illness, unspecified: Secondary | ICD-10-CM | POA: Diagnosis not present

## 2018-02-09 DIAGNOSIS — J449 Chronic obstructive pulmonary disease, unspecified: Secondary | ICD-10-CM | POA: Diagnosis not present

## 2018-02-09 DIAGNOSIS — Z794 Long term (current) use of insulin: Secondary | ICD-10-CM | POA: Diagnosis not present

## 2018-02-09 DIAGNOSIS — E1165 Type 2 diabetes mellitus with hyperglycemia: Secondary | ICD-10-CM | POA: Diagnosis not present

## 2018-02-09 DIAGNOSIS — Z9981 Dependence on supplemental oxygen: Secondary | ICD-10-CM | POA: Diagnosis not present

## 2018-02-09 DIAGNOSIS — D649 Anemia, unspecified: Secondary | ICD-10-CM | POA: Diagnosis not present

## 2018-02-09 DIAGNOSIS — I10 Essential (primary) hypertension: Secondary | ICD-10-CM | POA: Diagnosis not present

## 2018-02-09 DIAGNOSIS — I2510 Atherosclerotic heart disease of native coronary artery without angina pectoris: Secondary | ICD-10-CM | POA: Diagnosis not present

## 2018-02-09 DIAGNOSIS — S72141D Displaced intertrochanteric fracture of right femur, subsequent encounter for closed fracture with routine healing: Secondary | ICD-10-CM | POA: Diagnosis not present

## 2018-02-09 DIAGNOSIS — E118 Type 2 diabetes mellitus with unspecified complications: Secondary | ICD-10-CM | POA: Diagnosis not present

## 2018-02-09 DIAGNOSIS — E119 Type 2 diabetes mellitus without complications: Secondary | ICD-10-CM | POA: Diagnosis not present

## 2018-02-09 DIAGNOSIS — M1991 Primary osteoarthritis, unspecified site: Secondary | ICD-10-CM | POA: Diagnosis not present

## 2018-02-10 LAB — COMPLETE METABOLIC PANEL WITH GFR
AG Ratio: 1.1 (calc) (ref 1.0–2.5)
ALKALINE PHOSPHATASE (APISO): 79 U/L (ref 40–115)
ALT: 10 U/L (ref 9–46)
AST: 14 U/L (ref 10–35)
Albumin: 3.5 g/dL — ABNORMAL LOW (ref 3.6–5.1)
BUN/Creatinine Ratio: 23 (calc) — ABNORMAL HIGH (ref 6–22)
BUN: 34 mg/dL — ABNORMAL HIGH (ref 7–25)
CALCIUM: 9.1 mg/dL (ref 8.6–10.3)
CO2: 25 mmol/L (ref 20–32)
CREATININE: 1.48 mg/dL — AB (ref 0.70–1.18)
Chloride: 107 mmol/L (ref 98–110)
GFR, EST NON AFRICAN AMERICAN: 44 mL/min/{1.73_m2} — AB (ref >=60)
GFR, Est African American: 51 mL/min/{1.73_m2} — ABNORMAL LOW (ref >=60)
GLUCOSE: 204 mg/dL — AB (ref 65–139)
Globulin: 3.2 g/dL (calc) (ref 1.9–3.7)
Potassium: 5.3 mmol/L (ref 3.5–5.3)
Sodium: 138 mmol/L (ref 135–146)
Total Bilirubin: 0.5 mg/dL (ref 0.2–1.2)
Total Protein: 6.7 g/dL (ref 6.1–8.1)

## 2018-02-10 LAB — T4, FREE: FREE T4: 0.9 ng/dL (ref 0.8–1.8)

## 2018-02-10 LAB — TSH: TSH: 1.07 mIU/L (ref 0.40–4.50)

## 2018-02-10 LAB — VITAMIN D 25 HYDROXY (VIT D DEFICIENCY, FRACTURES): VIT D 25 HYDROXY: 34 ng/mL (ref 30–100)

## 2018-02-10 LAB — HEMOGLOBIN A1C
HEMOGLOBIN A1C: 8 %{Hb} — AB (ref <5.7)
Mean Plasma Glucose: 183 (calc)
eAG (mmol/L): 10.1 (calc)

## 2018-02-11 DIAGNOSIS — R69 Illness, unspecified: Secondary | ICD-10-CM | POA: Diagnosis not present

## 2018-02-11 DIAGNOSIS — J329 Chronic sinusitis, unspecified: Secondary | ICD-10-CM | POA: Diagnosis not present

## 2018-02-11 DIAGNOSIS — J449 Chronic obstructive pulmonary disease, unspecified: Secondary | ICD-10-CM | POA: Diagnosis not present

## 2018-02-11 DIAGNOSIS — I10 Essential (primary) hypertension: Secondary | ICD-10-CM | POA: Diagnosis not present

## 2018-02-11 DIAGNOSIS — S72141D Displaced intertrochanteric fracture of right femur, subsequent encounter for closed fracture with routine healing: Secondary | ICD-10-CM | POA: Diagnosis not present

## 2018-02-11 DIAGNOSIS — I2510 Atherosclerotic heart disease of native coronary artery without angina pectoris: Secondary | ICD-10-CM | POA: Diagnosis not present

## 2018-02-11 DIAGNOSIS — M1991 Primary osteoarthritis, unspecified site: Secondary | ICD-10-CM | POA: Diagnosis not present

## 2018-02-11 DIAGNOSIS — Z9981 Dependence on supplemental oxygen: Secondary | ICD-10-CM | POA: Diagnosis not present

## 2018-02-11 DIAGNOSIS — E119 Type 2 diabetes mellitus without complications: Secondary | ICD-10-CM | POA: Diagnosis not present

## 2018-02-11 DIAGNOSIS — D649 Anemia, unspecified: Secondary | ICD-10-CM | POA: Diagnosis not present

## 2018-02-16 ENCOUNTER — Encounter: Payer: Self-pay | Admitting: "Endocrinology

## 2018-02-16 ENCOUNTER — Ambulatory Visit: Payer: Medicare HMO | Admitting: "Endocrinology

## 2018-02-16 VITALS — BP 128/74 | HR 69 | Ht 72.0 in | Wt 201.0 lb

## 2018-02-16 DIAGNOSIS — I2510 Atherosclerotic heart disease of native coronary artery without angina pectoris: Secondary | ICD-10-CM | POA: Diagnosis not present

## 2018-02-16 DIAGNOSIS — E782 Mixed hyperlipidemia: Secondary | ICD-10-CM | POA: Diagnosis not present

## 2018-02-16 DIAGNOSIS — E118 Type 2 diabetes mellitus with unspecified complications: Secondary | ICD-10-CM

## 2018-02-16 DIAGNOSIS — D649 Anemia, unspecified: Secondary | ICD-10-CM | POA: Diagnosis not present

## 2018-02-16 DIAGNOSIS — M1991 Primary osteoarthritis, unspecified site: Secondary | ICD-10-CM | POA: Diagnosis not present

## 2018-02-16 DIAGNOSIS — J449 Chronic obstructive pulmonary disease, unspecified: Secondary | ICD-10-CM | POA: Diagnosis not present

## 2018-02-16 DIAGNOSIS — E1165 Type 2 diabetes mellitus with hyperglycemia: Secondary | ICD-10-CM

## 2018-02-16 DIAGNOSIS — R69 Illness, unspecified: Secondary | ICD-10-CM | POA: Diagnosis not present

## 2018-02-16 DIAGNOSIS — I10 Essential (primary) hypertension: Secondary | ICD-10-CM | POA: Diagnosis not present

## 2018-02-16 DIAGNOSIS — Z794 Long term (current) use of insulin: Secondary | ICD-10-CM

## 2018-02-16 DIAGNOSIS — J329 Chronic sinusitis, unspecified: Secondary | ICD-10-CM | POA: Diagnosis not present

## 2018-02-16 DIAGNOSIS — IMO0002 Reserved for concepts with insufficient information to code with codable children: Secondary | ICD-10-CM

## 2018-02-16 DIAGNOSIS — S72141D Displaced intertrochanteric fracture of right femur, subsequent encounter for closed fracture with routine healing: Secondary | ICD-10-CM | POA: Diagnosis not present

## 2018-02-16 DIAGNOSIS — E119 Type 2 diabetes mellitus without complications: Secondary | ICD-10-CM | POA: Diagnosis not present

## 2018-02-16 DIAGNOSIS — Z9981 Dependence on supplemental oxygen: Secondary | ICD-10-CM | POA: Diagnosis not present

## 2018-02-16 MED ORDER — INSULIN DEGLUDEC 100 UNIT/ML ~~LOC~~ SOPN: 15.0000 [IU] | PEN_INJECTOR | Freq: Every day | SUBCUTANEOUS | 2 refills | Status: DC

## 2018-02-16 MED ORDER — INSULIN PEN NEEDLE 31G X 8 MM MISC: 1.0000 | 3 refills | Status: DC

## 2018-02-16 NOTE — Progress Notes (Signed)
Subjective:    Patient ID: Joshua Fletcher, male    DOB: May 30, 1936, PCP Sinda Du, MD   Past Medical History:  Diagnosis Date  . Arthritis   . Constipation   . COPD (chronic obstructive pulmonary disease) ([place])   . Coronary artery disease   . Diabetes mellitus   . Hypertension   . Skin cancer of face    Past Surgical History:  Procedure Laterality Date  . APPENDECTOMY  1958  . BACK SURGERY    . CORONARY STENT PLACEMENT    . FEMUR IM NAIL Right 08/23/2017   Procedure: INTRAMEDULLARY (IM) NAIL FEMORAL;  Surgeon: Nicholes Stairs, MD;  Location: [facility];  Service: Orthopedics;  Laterality: Right;  . SKIN CANCER EXCISION  07/2017   Social History   Socioeconomic History  . Marital status: Married    Spouse name: Not on file  . Number of children: Not on file  . Years of education: Not on file  . Highest education level: Not on file  Occupational History  . Not on file  Social Needs  . Financial resource strain: Not on file  . Food insecurity:    Worry: Not on file    Inability: Not on file  . Transportation needs:    Medical: Not on file    Non-medical: Not on file  Tobacco Use  . Smoking status: Current Every Day Smoker    Packs/day: 1.00    Years: 25.00    Pack years: 25.00    Types: Cigarettes  . Smokeless tobacco: Never Used  Substance and Sexual Activity  . Alcohol use: No  . Drug use: No  . Sexual activity: Not on file  Lifestyle  . Physical activity:    Days per week: Not on file    Minutes per session: Not on file  . Stress: Not on file  Relationships  . Social connections:    Talks on phone: Not on file    Gets together: Not on file    Attends religious service: Not on file    Active member of club or organization: Not on file    Attends meetings of clubs or organizations: Not on file    Relationship status: Not on file  Other Topics Concern  . Not on file  Social History Narrative  . Not on file   Outpatient Encounter  Medications as of 02/16/2018  Medication Sig  . acetaminophen (TYLENOL) 325 MG tablet Take 650 mg by mouth every 6 (six) hours as needed.  Marland Kitchen aspirin EC 81 MG tablet Take 81 mg by mouth daily.   . cholecalciferol (VITAMIN D) 1000 units tablet Take 5,000 Units by mouth daily.  Marland Kitchen gabapentin (NEURONTIN) 300 MG capsule Take 1 capsule (300 mg total) by mouth at bedtime. (Patient taking differently: Take 300 mg by mouth 3 (three) times daily. )  . Melatonin 3 MG TABS Take 3 mg by mouth at bedtime.  . metoprolol tartrate (LOPRESSOR) 50 MG tablet Take 50 mg by mouth 2 (two) times daily.   . Misc Natural Products (OSTEO BI-FLEX ADV TRIPLE ST PO) Take 1 tablet by mouth once a day  . Multiple Vitamin (MULTIVITAMIN WITH MINERALS) TABS tablet Take 1 tablet by mouth daily.  Marland Kitchen Phenyleph-CPM-DM-Aspirin (ALKA-SELTZER PLUS COLD & COUGH PO) Take 2 tablets by mouth daily as needed (nasal congestion).  . simvastatin (ZOCOR) 40 MG tablet Take 40 mg by mouth daily.  . tamsulosin (FLOMAX) 0.4 MG CAPS capsule Take 0.4 mg  by mouth.  . trolamine salicylate (ASPERCREME) 10 % cream Apply 1 application topically as needed for muscle pain.  . [DISCONTINUED] insulin aspart (NOVOLOG FLEXPEN) 100 UNIT/ML FlexPen Inject into the skin 2 (two) times daily. sliding scale   . [DISCONTINUED] metFORMIN (GLUCOPHAGE) 1000 MG tablet Take 1 tablet (1,000 mg total) by mouth 2 (two) times daily with a meal.  . insulin degludec (TRESIBA FLEXTOUCH) 100 UNIT/ML SOPN FlexTouch Pen Inject 0.15 mLs (15 Units total) into the skin daily at 10 pm.  . Insulin Pen Needle (B-D ULTRAFINE III SHORT PEN) 31G X 8 MM MISC 1 each by Does not apply route as directed.  . [DISCONTINUED] Balsam [place]-Castor Oil (VENELEX) OINT Apply to sacrum and bilateral buttocks q shift and prn erythema. Every shift  . [DISCONTINUED] blood glucose meter kit and supplies KIT 1 each by Does not apply route 4 (four) times daily. One Touch Ultra . E11.65 Check BG up to 4 x daily  .  [DISCONTINUED] ferrous sulfate (IRON SUPPLEMENT) 325 (65 FE) MG tablet Take 325 mg by mouth daily with breakfast.  . [DISCONTINUED] gabapentin (NEURONTIN) 100 MG capsule Take 200 mg every morning by mouth.  . [DISCONTINUED] ipratropium-albuterol (DUONEB) 0.5-2.5 (3) MG/3ML SOLN Take 3 mLs by nebulization every 6 (six) hours as needed.  . [DISCONTINUED] magnesium gluconate (MAGONATE) 500 MG tablet Take 500 mg by mouth daily.  . [DISCONTINUED] methocarbamol (ROBAXIN) 500 MG tablet Take 1 tablet (500 mg total) by mouth every 6 (six) hours as needed for muscle spasms.  . [DISCONTINUED] polyethylene glycol (MIRALAX / GLYCOLAX) packet Take 17 g by mouth daily.   No facility-administered encounter medications on file as of 02/16/2018.    ALLERGIES: No Known Allergies VACCINATION STATUS: Immunization History  Administered Date(s) Administered  . Influenza-Unspecified 08/11/2017  . Pneumococcal Conjugate-13 10/06/2017  . Tdap 10/06/2017    Diabetes  He presents for his follow-up diabetic visit. He has type 2 diabetes mellitus. Onset time: He was diagnosed at approximate age of 3 years. His disease course has been worsening. There are no hypoglycemic associated symptoms. Pertinent negatives for hypoglycemia include no confusion, headaches, pallor or seizures. Pertinent negatives for diabetes include no chest pain, no fatigue, no polydipsia, no polyphagia, no polyuria and no weakness. There are no hypoglycemic complications. Symptoms are worsening. Diabetic complications include heart disease. Risk factors for coronary artery disease include diabetes mellitus, dyslipidemia, hypertension, male sex and sedentary lifestyle. Current diabetic treatment includes oral agent (monotherapy). His weight is increasing steadily. He is following a generally unhealthy diet. When asked about meal planning, he reported none. He has not had a previous visit with a dietitian. Exercise: He is currently on physical therapy  related to his recent hip surgery. There is no change in his home blood glucose trend. (He did not bring any meter nor logs to review today.  His A1c is higher at 8%.) An ACE inhibitor/angiotensin II receptor blocker is being taken. Eye exam is current.    He has reported complications including CAD which required stent placement, CHF,  and likely retinopathy. Pt is also being treated for neuropathy, pain syndrome, HTN and HPL. he continues to smoke 1PPD and he is not ready to quit. no other interval problems reported.  Review of Systems  Constitutional: Negative for fatigue and unexpected weight change.  HENT: Negative for dental problem, mouth sores and trouble swallowing.   Eyes: Negative for visual disturbance.  Respiratory: Negative for cough, choking, chest tightness, shortness of breath and wheezing.  Cardiovascular: Negative for chest pain, palpitations and leg swelling.  Gastrointestinal: Negative for abdominal distention, abdominal pain, constipation, diarrhea, nausea and vomiting.  Endocrine: Negative for polydipsia, polyphagia and polyuria.  Genitourinary: Negative for dysuria, flank pain, hematuria and urgency.  Musculoskeletal: Negative for back pain, gait problem, myalgias and neck pain.  Skin: Negative for pallor, rash and wound.  Neurological: Negative for seizures, syncope, weakness, numbness and headaches.  Psychiatric/Behavioral: Negative.  Negative for confusion and dysphoric mood.    Objective:    BP 128/74   Pulse 69   Ht 6' (1.829 m)   Wt 201 lb (91.2 kg)   BMI 27.26 kg/m   Wt Readings from Last 3 Encounters:  02/16/18 201 lb (91.2 kg)  12/05/17 183 lb (83 kg)  11/27/17 172 lb 12.8 oz (78.4 kg)    Physical Exam  Constitutional: He is oriented to person, place, and time. He appears well-developed. He is cooperative. No distress.  Walks with a walker.  HENT:  Head: Normocephalic and atraumatic.  Eyes: EOM are normal.  Neck: Normal range of motion. Neck  supple. No tracheal deviation present. No thyromegaly present.  Cardiovascular: Normal rate, S1 normal, S2 normal and normal heart sounds. Exam reveals no gallop.  No murmur heard. Pulses:      Dorsalis pedis pulses are 1+ on the right side, and 1+ on the left side.       Posterior tibial pulses are 1+ on the right side, and 1+ on the left side.  Pulmonary/Chest: Effort normal. No respiratory distress. He has no wheezes.  Abdominal: He exhibits no distension. There is no tenderness. There is no guarding and no CVA tenderness.  Musculoskeletal: He exhibits no edema.       Right shoulder: He exhibits no swelling and no deformity.  Neurological: He is alert and oriented to person, place, and time. He has normal strength and normal reflexes. No cranial nerve deficit or sensory deficit. Gait normal.  Skin: Skin is warm and dry. No rash noted. No cyanosis. Nails show no clubbing.  Psychiatric: He has a normal mood and affect. His speech is normal. Cognition and memory are normal.    Results for orders placed or performed during the hospital encounter of 87/86/76  Basic metabolic panel  Result Value Ref Range   Sodium 137 135 - 145 mmol/L   Potassium 4.8 3.5 - 5.1 mmol/L   Chloride 103 101 - 111 mmol/L   CO2 26 22 - 32 mmol/L   Glucose, Bld 189 (H) 65 - 99 mg/dL   BUN 26 (H) 6 - 20 mg/dL   Creatinine, Ser 0.95 0.61 - 1.24 mg/dL   Calcium 8.9 8.9 - 10.3 mg/dL   GFR calc non Af Amer >60 >60 mL/min   GFR calc Af Amer >60 >60 mL/min   Anion gap 8 5 - 15  CBC with Differential/Platelet  Result Value Ref Range   WBC 5.9 4.0 - 10.5 K/uL   RBC 3.75 (L) 4.22 - 5.81 MIL/uL   Hemoglobin 11.1 (L) 13.0 - 17.0 g/dL   HCT 35.2 (L) 39.0 - 52.0 %   MCV 93.9 78.0 - 100.0 fL   MCH 29.6 26.0 - 34.0 pg   MCHC 31.5 30.0 - 36.0 g/dL   RDW 14.8 11.5 - 15.5 %   Platelets 160 150 - 400 K/uL   Neutrophils Relative % 50 %   Neutro Abs 3.0 1.7 - 7.7 K/uL   Lymphocytes Relative 33 %   Lymphs Abs 1.9 0.7 -  4.0  K/uL   Monocytes Relative 9 %   Monocytes Absolute 0.5 0.1 - 1.0 K/uL   Eosinophils Relative 7 %   Eosinophils Absolute 0.4 0.0 - 0.7 K/uL   Basophils Relative 1 %   Basophils Absolute 0.0 0.0 - 0.1 K/uL   Complete Blood Count (Most recent): Lab Results  Component Value Date   WBC 5.9 12/07/2017   HGB 11.1 (L) 12/07/2017   HCT 35.2 (L) 12/07/2017   MCV 93.9 12/07/2017   PLT 160 12/07/2017   Chemistry (most recent): Lab Results  Component Value Date   NA 138 02/09/2018   K 5.3 02/09/2018   CL 107 02/09/2018   CO2 25 02/09/2018   BUN 34 (H) 02/09/2018   CREATININE 1.48 (H) 02/09/2018    Assessment & Plan:   1. Uncontrolled type 2 diabetes mellitus with complication, with long-term current use of insulin ([place]) -His diabetes is  complicated by CAD and CKD and patient remains at a high risk for more acute and chronic complications of diabetes which include CAD, CVA, CKD, retinopathy, and neuropathy. These are all discussed in detail with the patient.  He recently had hospitalization and stay in rehab center related to hip surgery.  He was discharged home on NovoLog and metformin.  His A1c is higher at 8% compared to 6% last visit.       Recent labs reviewed, showing stage 3 renal insufficiency.  - I have re-counseled the patient on diet management  by adopting a carbohydrate restricted / protein rich  Diet.  -Suggestion is made for him to avoid simple carbohydrates  from his diet including Cakes, Sweet Desserts, Ice Cream, Soda (diet and regular), Sweet Tea, Candies, Chips, Cookies, Store Bought Juices, Alcohol in Excess of  1-2 drinks a day, Artificial Sweeteners, and "Sugar-free" Products. This will help patient to have stable blood glucose profile and potentially avoid unintended weight gain.  - Patient is advised to stick to a routine mealtimes to eat 3 meals  a day and avoid unnecessary snacks ( to snack only to correct hypoglycemia).   - I have approached patient with  the following individualized plan to manage diabetes and patient agrees.  -I advised him to start monitoring blood glucose 4 times a day-daily before meals and at bedtime. -I discussed and resumed basal insulin Tresiba 15 units nightly.  I have advised him to discontinue NovoLog for now.  -I advised him to discontinue metformin for now. - Patient specific target  for A1c; LDL, HDL, Triglycerides, and  Waist Circumference were discussed in detail.  2) BP/HTN: His blood pressure is controlled.  I have advised him to continue his current blood pressure medications including ACEI/ARB. 3) Lipids/HPL:  continue statins. 4)  Weight/Diet: Currently gaining weight, cannot exercise optimally.  CDE consult in progress, exercise, and carbohydrates information provided.  5) Chronic Care/Health Maintenance:  -Patient is on ACEI/ARB and Statin medications and encouraged to continue to follow up with Ophthalmology, Podiatrist at least yearly or according to recommendations, and advised to quit smoking. I have recommended yearly flu vaccine and pneumonia vaccination at least every 5 years;  and  sleep for at least 7 hours a day.  - I advised patient to maintain close follow up with Sinda Du, MD for primary care needs.  - Time spent with the patient: 25 min, of which >50% was spent in reviewing his  current and  previous labs, previous treatments, and medications doses and developing a plan for long-term care.  Chesky L Roger participated in the discussions, expressed understanding, and voiced agreement with the above plans.  All questions were answered to his satisfaction. he is encouraged to contact clinic should he have any questions or concerns prior to his return visit.  Follow up plan: -Return in about 10 days (around 02/26/2018) for follow up with meter and logs- no labs.  Glade Lloyd, MD Phone: [phone]  Fax: [phone]   This note was partially dictated with voice recognition software.  Similar sounding words can be transcribed inadequately or may not  be corrected upon review.  02/16/2018, 1:01 PM

## 2018-02-18 DIAGNOSIS — J329 Chronic sinusitis, unspecified: Secondary | ICD-10-CM | POA: Diagnosis not present

## 2018-02-18 DIAGNOSIS — R69 Illness, unspecified: Secondary | ICD-10-CM | POA: Diagnosis not present

## 2018-02-18 DIAGNOSIS — I2510 Atherosclerotic heart disease of native coronary artery without angina pectoris: Secondary | ICD-10-CM | POA: Diagnosis not present

## 2018-02-18 DIAGNOSIS — E119 Type 2 diabetes mellitus without complications: Secondary | ICD-10-CM | POA: Diagnosis not present

## 2018-02-18 DIAGNOSIS — Z9981 Dependence on supplemental oxygen: Secondary | ICD-10-CM | POA: Diagnosis not present

## 2018-02-18 DIAGNOSIS — J449 Chronic obstructive pulmonary disease, unspecified: Secondary | ICD-10-CM | POA: Diagnosis not present

## 2018-02-18 DIAGNOSIS — D649 Anemia, unspecified: Secondary | ICD-10-CM | POA: Diagnosis not present

## 2018-02-18 DIAGNOSIS — S72141D Displaced intertrochanteric fracture of right femur, subsequent encounter for closed fracture with routine healing: Secondary | ICD-10-CM | POA: Diagnosis not present

## 2018-02-18 DIAGNOSIS — M1991 Primary osteoarthritis, unspecified site: Secondary | ICD-10-CM | POA: Diagnosis not present

## 2018-02-18 DIAGNOSIS — I10 Essential (primary) hypertension: Secondary | ICD-10-CM | POA: Diagnosis not present

## 2018-02-23 DIAGNOSIS — R69 Illness, unspecified: Secondary | ICD-10-CM | POA: Diagnosis not present

## 2018-02-23 DIAGNOSIS — I2510 Atherosclerotic heart disease of native coronary artery without angina pectoris: Secondary | ICD-10-CM | POA: Diagnosis not present

## 2018-02-23 DIAGNOSIS — E119 Type 2 diabetes mellitus without complications: Secondary | ICD-10-CM | POA: Diagnosis not present

## 2018-02-23 DIAGNOSIS — S72141D Displaced intertrochanteric fracture of right femur, subsequent encounter for closed fracture with routine healing: Secondary | ICD-10-CM | POA: Diagnosis not present

## 2018-02-23 DIAGNOSIS — Z9981 Dependence on supplemental oxygen: Secondary | ICD-10-CM | POA: Diagnosis not present

## 2018-02-23 DIAGNOSIS — J329 Chronic sinusitis, unspecified: Secondary | ICD-10-CM | POA: Diagnosis not present

## 2018-02-23 DIAGNOSIS — J449 Chronic obstructive pulmonary disease, unspecified: Secondary | ICD-10-CM | POA: Diagnosis not present

## 2018-02-23 DIAGNOSIS — M1991 Primary osteoarthritis, unspecified site: Secondary | ICD-10-CM | POA: Diagnosis not present

## 2018-02-23 DIAGNOSIS — D649 Anemia, unspecified: Secondary | ICD-10-CM | POA: Diagnosis not present

## 2018-02-23 DIAGNOSIS — I10 Essential (primary) hypertension: Secondary | ICD-10-CM | POA: Diagnosis not present

## 2018-02-25 DIAGNOSIS — S72141D Displaced intertrochanteric fracture of right femur, subsequent encounter for closed fracture with routine healing: Secondary | ICD-10-CM | POA: Diagnosis not present

## 2018-02-25 DIAGNOSIS — J329 Chronic sinusitis, unspecified: Secondary | ICD-10-CM | POA: Diagnosis not present

## 2018-02-25 DIAGNOSIS — I10 Essential (primary) hypertension: Secondary | ICD-10-CM | POA: Diagnosis not present

## 2018-02-25 DIAGNOSIS — R69 Illness, unspecified: Secondary | ICD-10-CM | POA: Diagnosis not present

## 2018-02-25 DIAGNOSIS — D649 Anemia, unspecified: Secondary | ICD-10-CM | POA: Diagnosis not present

## 2018-02-25 DIAGNOSIS — I2510 Atherosclerotic heart disease of native coronary artery without angina pectoris: Secondary | ICD-10-CM | POA: Diagnosis not present

## 2018-02-25 DIAGNOSIS — J449 Chronic obstructive pulmonary disease, unspecified: Secondary | ICD-10-CM | POA: Diagnosis not present

## 2018-02-25 DIAGNOSIS — M1991 Primary osteoarthritis, unspecified site: Secondary | ICD-10-CM | POA: Diagnosis not present

## 2018-02-25 DIAGNOSIS — Z9981 Dependence on supplemental oxygen: Secondary | ICD-10-CM | POA: Diagnosis not present

## 2018-02-25 DIAGNOSIS — E119 Type 2 diabetes mellitus without complications: Secondary | ICD-10-CM | POA: Diagnosis not present

## 2018-02-26 DIAGNOSIS — I2510 Atherosclerotic heart disease of native coronary artery without angina pectoris: Secondary | ICD-10-CM | POA: Diagnosis not present

## 2018-02-26 DIAGNOSIS — E119 Type 2 diabetes mellitus without complications: Secondary | ICD-10-CM | POA: Diagnosis not present

## 2018-02-26 DIAGNOSIS — J449 Chronic obstructive pulmonary disease, unspecified: Secondary | ICD-10-CM | POA: Diagnosis not present

## 2018-02-26 DIAGNOSIS — M545 Low back pain: Secondary | ICD-10-CM | POA: Diagnosis not present

## 2018-03-02 DIAGNOSIS — I10 Essential (primary) hypertension: Secondary | ICD-10-CM | POA: Diagnosis not present

## 2018-03-02 DIAGNOSIS — I2510 Atherosclerotic heart disease of native coronary artery without angina pectoris: Secondary | ICD-10-CM | POA: Diagnosis not present

## 2018-03-02 DIAGNOSIS — Z9981 Dependence on supplemental oxygen: Secondary | ICD-10-CM | POA: Diagnosis not present

## 2018-03-02 DIAGNOSIS — J449 Chronic obstructive pulmonary disease, unspecified: Secondary | ICD-10-CM | POA: Diagnosis not present

## 2018-03-02 DIAGNOSIS — M1991 Primary osteoarthritis, unspecified site: Secondary | ICD-10-CM | POA: Diagnosis not present

## 2018-03-02 DIAGNOSIS — S72141D Displaced intertrochanteric fracture of right femur, subsequent encounter for closed fracture with routine healing: Secondary | ICD-10-CM | POA: Diagnosis not present

## 2018-03-02 DIAGNOSIS — E119 Type 2 diabetes mellitus without complications: Secondary | ICD-10-CM | POA: Diagnosis not present

## 2018-03-02 DIAGNOSIS — D649 Anemia, unspecified: Secondary | ICD-10-CM | POA: Diagnosis not present

## 2018-03-02 DIAGNOSIS — J329 Chronic sinusitis, unspecified: Secondary | ICD-10-CM | POA: Diagnosis not present

## 2018-03-02 DIAGNOSIS — R69 Illness, unspecified: Secondary | ICD-10-CM | POA: Diagnosis not present

## 2018-03-04 DIAGNOSIS — E1151 Type 2 diabetes mellitus with diabetic peripheral angiopathy without gangrene: Secondary | ICD-10-CM | POA: Diagnosis not present

## 2018-03-04 DIAGNOSIS — J449 Chronic obstructive pulmonary disease, unspecified: Secondary | ICD-10-CM | POA: Diagnosis not present

## 2018-03-04 DIAGNOSIS — I10 Essential (primary) hypertension: Secondary | ICD-10-CM | POA: Diagnosis not present

## 2018-03-04 DIAGNOSIS — Z9981 Dependence on supplemental oxygen: Secondary | ICD-10-CM | POA: Diagnosis not present

## 2018-03-04 DIAGNOSIS — I2510 Atherosclerotic heart disease of native coronary artery without angina pectoris: Secondary | ICD-10-CM | POA: Diagnosis not present

## 2018-03-04 DIAGNOSIS — M1991 Primary osteoarthritis, unspecified site: Secondary | ICD-10-CM | POA: Diagnosis not present

## 2018-03-04 DIAGNOSIS — E119 Type 2 diabetes mellitus without complications: Secondary | ICD-10-CM | POA: Diagnosis not present

## 2018-03-04 DIAGNOSIS — J329 Chronic sinusitis, unspecified: Secondary | ICD-10-CM | POA: Diagnosis not present

## 2018-03-04 DIAGNOSIS — D649 Anemia, unspecified: Secondary | ICD-10-CM | POA: Diagnosis not present

## 2018-03-04 DIAGNOSIS — R69 Illness, unspecified: Secondary | ICD-10-CM | POA: Diagnosis not present

## 2018-03-04 DIAGNOSIS — S72141D Displaced intertrochanteric fracture of right femur, subsequent encounter for closed fracture with routine healing: Secondary | ICD-10-CM | POA: Diagnosis not present

## 2018-03-04 DIAGNOSIS — E1140 Type 2 diabetes mellitus with diabetic neuropathy, unspecified: Secondary | ICD-10-CM | POA: Diagnosis not present

## 2018-03-05 ENCOUNTER — Ambulatory Visit: Payer: Medicare HMO | Admitting: "Endocrinology

## 2018-03-05 VITALS — BP 143/80 | HR 56 | Ht 72.0 in

## 2018-03-05 DIAGNOSIS — E1165 Type 2 diabetes mellitus with hyperglycemia: Secondary | ICD-10-CM | POA: Diagnosis not present

## 2018-03-05 DIAGNOSIS — Z794 Long term (current) use of insulin: Secondary | ICD-10-CM | POA: Diagnosis not present

## 2018-03-05 DIAGNOSIS — IMO0002 Reserved for concepts with insufficient information to code with codable children: Secondary | ICD-10-CM

## 2018-03-05 DIAGNOSIS — I10 Essential (primary) hypertension: Secondary | ICD-10-CM | POA: Diagnosis not present

## 2018-03-05 DIAGNOSIS — E782 Mixed hyperlipidemia: Secondary | ICD-10-CM

## 2018-03-05 DIAGNOSIS — E118 Type 2 diabetes mellitus with unspecified complications: Secondary | ICD-10-CM

## 2018-03-05 MED ORDER — INSULIN DEGLUDEC 100 UNIT/ML ~~LOC~~ SOPN: 30.0000 [IU] | PEN_INJECTOR | Freq: Every day | SUBCUTANEOUS | 2 refills | Status: DC

## 2018-03-05 NOTE — Progress Notes (Signed)
Subjective:    Patient ID: Joshua Fletcher, male    DOB: 07-Aug-1936, PCP Sinda Du, MD   Past Medical History:  Diagnosis Date  . Arthritis   . Constipation   . COPD (chronic obstructive pulmonary disease) ([place])   . Coronary artery disease   . Diabetes mellitus   . Hypertension   . Skin cancer of face    Past Surgical History:  Procedure Laterality Date  . APPENDECTOMY  1958  . BACK SURGERY    . CORONARY STENT PLACEMENT    . FEMUR IM NAIL Right 08/23/2017   Procedure: INTRAMEDULLARY (IM) NAIL FEMORAL;  Surgeon: Nicholes Stairs, MD;  Location: [facility];  Service: Orthopedics;  Laterality: Right;  . SKIN CANCER EXCISION  07/2017   Social History   Socioeconomic History  . Marital status: Married    Spouse name: Not on file  . Number of children: Not on file  . Years of education: Not on file  . Highest education level: Not on file  Occupational History  . Not on file  Social Needs  . Financial resource strain: Not on file  . Food insecurity:    Worry: Not on file    Inability: Not on file  . Transportation needs:    Medical: Not on file    Non-medical: Not on file  Tobacco Use  . Smoking status: Current Every Day Smoker    Packs/day: 1.00    Years: 25.00    Pack years: 25.00    Types: Cigarettes  . Smokeless tobacco: Never Used  Substance and Sexual Activity  . Alcohol use: No  . Drug use: No  . Sexual activity: Not on file  Lifestyle  . Physical activity:    Days per week: Not on file    Minutes per session: Not on file  . Stress: Not on file  Relationships  . Social connections:    Talks on phone: Not on file    Gets together: Not on file    Attends religious service: Not on file    Active member of club or organization: Not on file    Attends meetings of clubs or organizations: Not on file    Relationship status: Not on file  Other Topics Concern  . Not on file  Social History Narrative  . Not on file   Outpatient Encounter  Medications as of 03/05/2018  Medication Sig  . acetaminophen (TYLENOL) 325 MG tablet Take 650 mg by mouth every 6 (six) hours as needed.  Marland Kitchen aspirin EC 81 MG tablet Take 81 mg by mouth daily.   . cholecalciferol (VITAMIN D) 1000 units tablet Take 5,000 Units by mouth daily.  Marland Kitchen gabapentin (NEURONTIN) 300 MG capsule Take 1 capsule (300 mg total) by mouth at bedtime. (Patient taking differently: Take 300 mg by mouth 3 (three) times daily. )  . insulin degludec (TRESIBA FLEXTOUCH) 100 UNIT/ML SOPN FlexTouch Pen Inject 0.3 mLs (30 Units total) into the skin daily at 10 pm.  . Insulin Pen Needle (B-D ULTRAFINE III SHORT PEN) 31G X 8 MM MISC 1 each by Does not apply route as directed.  . Melatonin 3 MG TABS Take 3 mg by mouth at bedtime.  . metoprolol tartrate (LOPRESSOR) 50 MG tablet Take 50 mg by mouth 2 (two) times daily.   . Misc Natural Products (OSTEO BI-FLEX ADV TRIPLE ST PO) Take 1 tablet by mouth once a day  . Multiple Vitamin (MULTIVITAMIN WITH MINERALS) TABS tablet Take  1 tablet by mouth daily.  Marland Kitchen Phenyleph-CPM-DM-Aspirin (ALKA-SELTZER PLUS COLD & COUGH PO) Take 2 tablets by mouth daily as needed (nasal congestion).  . simvastatin (ZOCOR) 40 MG tablet Take 40 mg by mouth daily.  . tamsulosin (FLOMAX) 0.4 MG CAPS capsule Take 0.4 mg by mouth.  . trolamine salicylate (ASPERCREME) 10 % cream Apply 1 application topically as needed for muscle pain.  . [DISCONTINUED] insulin degludec (TRESIBA FLEXTOUCH) 100 UNIT/ML SOPN FlexTouch Pen Inject 0.15 mLs (15 Units total) into the skin daily at 10 pm.   No facility-administered encounter medications on file as of 03/05/2018.    ALLERGIES: No Known Allergies VACCINATION STATUS: Immunization History  Administered Date(s) Administered  . Influenza-Unspecified 08/11/2017  . Pneumococcal Conjugate-13 10/06/2017  . Tdap 10/06/2017    Diabetes  He presents for his follow-up diabetic visit. He has type 2 diabetes mellitus. Onset time: He was diagnosed  at approximate age of 78 years. His disease course has been worsening. There are no hypoglycemic associated symptoms. Pertinent negatives for hypoglycemia include no confusion, headaches, pallor or seizures. Pertinent negatives for diabetes include no chest pain, no fatigue, no polydipsia, no polyphagia, no polyuria and no weakness. There are no hypoglycemic complications. Symptoms are worsening. Diabetic complications include heart disease. Risk factors for coronary artery disease include diabetes mellitus, dyslipidemia, hypertension, male sex and sedentary lifestyle. Current diabetic treatment includes oral agent (monotherapy). His weight is increasing steadily. He is following a generally unhealthy diet. When asked about meal planning, he reported none. He has not had a previous visit with a dietitian. Exercise: He is currently on physical therapy related to his recent hip surgery. There is no change in his home blood glucose trend. His breakfast blood glucose range is generally >200 mg/dl. His lunch blood glucose range is generally >200 mg/dl. His dinner blood glucose range is generally >200 mg/dl. His bedtime blood glucose range is generally >200 mg/dl. His overall blood glucose range is >200 mg/dl. An ACE inhibitor/angiotensin II receptor blocker is being taken. Eye exam is current.    He has reported complications including CAD which required stent placement, CHF,  and likely retinopathy. Pt is also being treated for neuropathy, pain syndrome, HTN and HPL. he continues to smoke 1PPD and he is not ready to quit. no other interval problems reported.  Review of Systems  Constitutional: Negative for fatigue and unexpected weight change.  HENT: Negative for dental problem, mouth sores and trouble swallowing.   Eyes: Negative for visual disturbance.  Respiratory: Negative for cough, choking, chest tightness, shortness of breath and wheezing.   Cardiovascular: Negative for chest pain, palpitations and leg  swelling.  Gastrointestinal: Negative for abdominal distention, abdominal pain, constipation, diarrhea, nausea and vomiting.  Endocrine: Negative for polydipsia, polyphagia and polyuria.  Genitourinary: Negative for dysuria, flank pain, hematuria and urgency.  Musculoskeletal: Negative for back pain, gait problem, myalgias and neck pain.  Skin: Negative for pallor, rash and wound.  Neurological: Negative for seizures, syncope, weakness, numbness and headaches.  Psychiatric/Behavioral: Negative.  Negative for confusion and dysphoric mood.    Objective:    BP (!) 143/80   Pulse (!) 56   Ht 6' (1.829 m)   BMI 27.26 kg/m   Wt Readings from Last 3 Encounters:  02/16/18 201 lb (91.2 kg)  12/05/17 183 lb (83 kg)  11/27/17 172 lb 12.8 oz (78.4 kg)    Physical Exam  Constitutional: He is oriented to person, place, and time. He appears well-developed. He is cooperative. No distress.  Walks with a walker.  HENT:  Head: Normocephalic and atraumatic.  Eyes: EOM are normal.  Neck: Normal range of motion. Neck supple. No tracheal deviation present. No thyromegaly present.  Cardiovascular: Normal rate, S1 normal and S2 normal. Exam reveals no gallop.  No murmur heard. Pulses:      Dorsalis pedis pulses are 1+ on the right side, and 1+ on the left side.       Posterior tibial pulses are 1+ on the right side, and 1+ on the left side.  Pulmonary/Chest: No respiratory distress. He has no wheezes.  Abdominal: He exhibits no distension. There is no tenderness. There is no guarding and no CVA tenderness.  Musculoskeletal: He exhibits no edema.       Right shoulder: He exhibits no swelling and no deformity.  Neurological: He is alert and oriented to person, place, and time. He has normal strength and normal reflexes. No cranial nerve deficit or sensory deficit. Gait normal.  Skin: Skin is warm and dry. No rash noted. No cyanosis. Nails show no clubbing.  Psychiatric: He has a normal mood and affect.  His speech is normal. Cognition and memory are normal.    Results for orders placed or performed during the hospital encounter of 09/32/35  Basic metabolic panel  Result Value Ref Range   Sodium 137 135 - 145 mmol/L   Potassium 4.8 3.5 - 5.1 mmol/L   Chloride 103 101 - 111 mmol/L   CO2 26 22 - 32 mmol/L   Glucose, Bld 189 (H) 65 - 99 mg/dL   BUN 26 (H) 6 - 20 mg/dL   Creatinine, Ser 0.95 0.61 - 1.24 mg/dL   Calcium 8.9 8.9 - 10.3 mg/dL   GFR calc non Af Amer >60 >60 mL/min   GFR calc Af Amer >60 >60 mL/min   Anion gap 8 5 - 15  CBC with Differential/Platelet  Result Value Ref Range   WBC 5.9 4.0 - 10.5 K/uL   RBC 3.75 (L) 4.22 - 5.81 MIL/uL   Hemoglobin 11.1 (L) 13.0 - 17.0 g/dL   HCT 35.2 (L) 39.0 - 52.0 %   MCV 93.9 78.0 - 100.0 fL   MCH 29.6 26.0 - 34.0 pg   MCHC 31.5 30.0 - 36.0 g/dL   RDW 14.8 11.5 - 15.5 %   Platelets 160 150 - 400 K/uL   Neutrophils Relative % 50 %   Neutro Abs 3.0 1.7 - 7.7 K/uL   Lymphocytes Relative 33 %   Lymphs Abs 1.9 0.7 - 4.0 K/uL   Monocytes Relative 9 %   Monocytes Absolute 0.5 0.1 - 1.0 K/uL   Eosinophils Relative 7 %   Eosinophils Absolute 0.4 0.0 - 0.7 K/uL   Basophils Relative 1 %   Basophils Absolute 0.0 0.0 - 0.1 K/uL   Complete Blood Count (Most recent): Lab Results  Component Value Date   WBC 5.9 12/07/2017   HGB 11.1 (L) 12/07/2017   HCT 35.2 (L) 12/07/2017   MCV 93.9 12/07/2017   PLT 160 12/07/2017   Chemistry (most recent): Lab Results  Component Value Date   NA 138 02/09/2018   K 5.3 02/09/2018   CL 107 02/09/2018   CO2 25 02/09/2018   BUN 34 (H) 02/09/2018   CREATININE 1.48 (H) 02/09/2018   Lipid Panel     Component Value Date/Time   CHOL 110 10/09/2017 0400   TRIG 107 10/09/2017 0400   HDL 48 10/09/2017 0400   CHOLHDL 2.3 10/09/2017 0400  VLDL 21 10/09/2017 0400   LDLCALC 41 10/09/2017 0400   Assessment & Plan:   1. Uncontrolled type 2 diabetes mellitus with complication, with long-term current use  of insulin ([place]) -His diabetes is  complicated by CAD and CKD and patient remains at a high risk for more acute and chronic complications of diabetes which include CAD, CVA, CKD, retinopathy, and neuropathy. These are all discussed in detail with the patient.  He recently had hospitalization and stay in rehab center related to hip surgery.  -During his last visit, he was continued on basal insulin Tresiba 15 units nightly. -He was asked to start monitoring blood glucose 4 times a day.  He returns with persistently above target blood glucose profile averaging between 200- 250.  No reported nor documented hypoglycemia.  -During his recent visit, A1c was 8%.    Recent labs reviewed, showing stage 3 renal insufficiency.  - I have re-counseled the patient on diet management  by adopting a carbohydrate restricted / protein rich  Diet.  -  Suggestion is made for him to avoid simple carbohydrates  from his diet including Cakes, Sweet Desserts / Pastries, Ice Cream, Soda (diet and regular), Sweet Tea, Candies, Chips, Cookies, Store Bought Juices, Alcohol in Excess of  1-2 drinks a day, Artificial Sweeteners, and "Sugar-free" Products. This will help patient to have stable blood glucose profile and potentially avoid unintended weight gain.   - Patient is advised to stick to a routine mealtimes to eat 3 meals  a day and avoid unnecessary snacks ( to snack only to correct hypoglycemia).   - I have approached patient with the following individualized plan to manage diabetes and patient agrees.  -Based on his presentation with severe and persistent hyperglycemia above target, he will need higher dose of insulin.  -Given his advanced age, before adding prandial insulin, it is important to maximize his basal insulin.   -I approached him to increase his Tyler Aas to 30 units nightly, associated with monitoring blood glucose 4 times a day-daily before meals and at bedtime and return in 4 weeks for  reevaluation. -He is advised to call clinic if he registers blood glucose below 70 or above 200 x 3 . - I have advised him to hold NovoLog and metformin for now.    - Patient specific target  for A1c; LDL, HDL, Triglycerides, and  Waist Circumference were discussed in detail.  2) BP/HTN: His blood pressure is above target.  He will not need additional treatment.  I have advised him to continue his current blood pressure medications including ACEI/ARB. 3) Lipids/HPL: Controlled lipid panel with LDL of 41, continue statins. 4)  Weight/Diet: Currently gaining weight, cannot exercise optimally recovering from recent hip surgery.  CDE consult in progress, exercise, and carbohydrates information provided.  5) Chronic Care/Health Maintenance:  -Patient is on ACEI/ARB and Statin medications and encouraged to continue to follow up with Ophthalmology, Podiatrist at least yearly or according to recommendations, and advised to quit smoking. I have recommended yearly flu vaccine and pneumonia vaccination at least every 5 years;  and  sleep for at least 7 hours a day.  - I advised patient to maintain close follow up with Sinda Du, MD for primary care needs.  - Time spent with the patient: 25 min, of which >50% was spent in reviewing his blood glucose logs , discussing his hypo- and hyper-glycemic episodes, reviewing his current and  previous labs and insulin doses and developing a plan to avoid hypo- and  hyper-glycemia. Please refer to Patient Instructions for Blood Glucose Monitoring and Insulin/Medications Dosing Guide"  in media tab for additional information. Joshua Fletcher participated in the discussions, expressed understanding, and voiced agreement with the above plans.  All questions were answered to his satisfaction. he is encouraged to contact clinic should he have any questions or concerns prior to his return visit.  Follow up plan: -Return in about 1 month (around 04/02/2018) for follow up with  meter and logs- no labs.  Glade Lloyd, MD Phone: [phone]  Fax: [phone]   This note was partially dictated with voice recognition software. Similar sounding words can be transcribed inadequately or may not  be corrected upon review.  03/05/2018, 2:04 PM

## 2018-03-05 NOTE — Patient Instructions (Signed)

## 2018-03-09 DIAGNOSIS — J449 Chronic obstructive pulmonary disease, unspecified: Secondary | ICD-10-CM | POA: Diagnosis not present

## 2018-03-09 DIAGNOSIS — D649 Anemia, unspecified: Secondary | ICD-10-CM | POA: Diagnosis not present

## 2018-03-09 DIAGNOSIS — R69 Illness, unspecified: Secondary | ICD-10-CM | POA: Diagnosis not present

## 2018-03-09 DIAGNOSIS — S72141D Displaced intertrochanteric fracture of right femur, subsequent encounter for closed fracture with routine healing: Secondary | ICD-10-CM | POA: Diagnosis not present

## 2018-03-09 DIAGNOSIS — I10 Essential (primary) hypertension: Secondary | ICD-10-CM | POA: Diagnosis not present

## 2018-03-09 DIAGNOSIS — Z9981 Dependence on supplemental oxygen: Secondary | ICD-10-CM | POA: Diagnosis not present

## 2018-03-09 DIAGNOSIS — M1991 Primary osteoarthritis, unspecified site: Secondary | ICD-10-CM | POA: Diagnosis not present

## 2018-03-09 DIAGNOSIS — I2510 Atherosclerotic heart disease of native coronary artery without angina pectoris: Secondary | ICD-10-CM | POA: Diagnosis not present

## 2018-03-09 DIAGNOSIS — J329 Chronic sinusitis, unspecified: Secondary | ICD-10-CM | POA: Diagnosis not present

## 2018-03-09 DIAGNOSIS — E119 Type 2 diabetes mellitus without complications: Secondary | ICD-10-CM | POA: Diagnosis not present

## 2018-03-11 DIAGNOSIS — S72141D Displaced intertrochanteric fracture of right femur, subsequent encounter for closed fracture with routine healing: Secondary | ICD-10-CM | POA: Diagnosis not present

## 2018-03-11 DIAGNOSIS — E119 Type 2 diabetes mellitus without complications: Secondary | ICD-10-CM | POA: Diagnosis not present

## 2018-03-11 DIAGNOSIS — M1991 Primary osteoarthritis, unspecified site: Secondary | ICD-10-CM | POA: Diagnosis not present

## 2018-03-11 DIAGNOSIS — D649 Anemia, unspecified: Secondary | ICD-10-CM | POA: Diagnosis not present

## 2018-03-11 DIAGNOSIS — I10 Essential (primary) hypertension: Secondary | ICD-10-CM | POA: Diagnosis not present

## 2018-03-11 DIAGNOSIS — Z9981 Dependence on supplemental oxygen: Secondary | ICD-10-CM | POA: Diagnosis not present

## 2018-03-11 DIAGNOSIS — J329 Chronic sinusitis, unspecified: Secondary | ICD-10-CM | POA: Diagnosis not present

## 2018-03-11 DIAGNOSIS — J449 Chronic obstructive pulmonary disease, unspecified: Secondary | ICD-10-CM | POA: Diagnosis not present

## 2018-03-11 DIAGNOSIS — R69 Illness, unspecified: Secondary | ICD-10-CM | POA: Diagnosis not present

## 2018-03-11 DIAGNOSIS — I2510 Atherosclerotic heart disease of native coronary artery without angina pectoris: Secondary | ICD-10-CM | POA: Diagnosis not present

## 2018-03-16 DIAGNOSIS — I10 Essential (primary) hypertension: Secondary | ICD-10-CM | POA: Diagnosis not present

## 2018-03-16 DIAGNOSIS — E119 Type 2 diabetes mellitus without complications: Secondary | ICD-10-CM | POA: Diagnosis not present

## 2018-03-16 DIAGNOSIS — J449 Chronic obstructive pulmonary disease, unspecified: Secondary | ICD-10-CM | POA: Diagnosis not present

## 2018-03-16 DIAGNOSIS — J329 Chronic sinusitis, unspecified: Secondary | ICD-10-CM | POA: Diagnosis not present

## 2018-03-16 DIAGNOSIS — S72141D Displaced intertrochanteric fracture of right femur, subsequent encounter for closed fracture with routine healing: Secondary | ICD-10-CM | POA: Diagnosis not present

## 2018-03-16 DIAGNOSIS — R69 Illness, unspecified: Secondary | ICD-10-CM | POA: Diagnosis not present

## 2018-03-16 DIAGNOSIS — M1991 Primary osteoarthritis, unspecified site: Secondary | ICD-10-CM | POA: Diagnosis not present

## 2018-03-16 DIAGNOSIS — D649 Anemia, unspecified: Secondary | ICD-10-CM | POA: Diagnosis not present

## 2018-03-16 DIAGNOSIS — I2510 Atherosclerotic heart disease of native coronary artery without angina pectoris: Secondary | ICD-10-CM | POA: Diagnosis not present

## 2018-03-16 DIAGNOSIS — Z9981 Dependence on supplemental oxygen: Secondary | ICD-10-CM | POA: Diagnosis not present

## 2018-03-18 DIAGNOSIS — J329 Chronic sinusitis, unspecified: Secondary | ICD-10-CM | POA: Diagnosis not present

## 2018-03-18 DIAGNOSIS — S72141D Displaced intertrochanteric fracture of right femur, subsequent encounter for closed fracture with routine healing: Secondary | ICD-10-CM | POA: Diagnosis not present

## 2018-03-18 DIAGNOSIS — Z9981 Dependence on supplemental oxygen: Secondary | ICD-10-CM | POA: Diagnosis not present

## 2018-03-18 DIAGNOSIS — I10 Essential (primary) hypertension: Secondary | ICD-10-CM | POA: Diagnosis not present

## 2018-03-18 DIAGNOSIS — R69 Illness, unspecified: Secondary | ICD-10-CM | POA: Diagnosis not present

## 2018-03-18 DIAGNOSIS — E119 Type 2 diabetes mellitus without complications: Secondary | ICD-10-CM | POA: Diagnosis not present

## 2018-03-18 DIAGNOSIS — I2510 Atherosclerotic heart disease of native coronary artery without angina pectoris: Secondary | ICD-10-CM | POA: Diagnosis not present

## 2018-03-18 DIAGNOSIS — J449 Chronic obstructive pulmonary disease, unspecified: Secondary | ICD-10-CM | POA: Diagnosis not present

## 2018-03-18 DIAGNOSIS — M1991 Primary osteoarthritis, unspecified site: Secondary | ICD-10-CM | POA: Diagnosis not present

## 2018-03-18 DIAGNOSIS — D649 Anemia, unspecified: Secondary | ICD-10-CM | POA: Diagnosis not present

## 2018-03-23 DIAGNOSIS — I2510 Atherosclerotic heart disease of native coronary artery without angina pectoris: Secondary | ICD-10-CM | POA: Diagnosis not present

## 2018-03-23 DIAGNOSIS — Z9981 Dependence on supplemental oxygen: Secondary | ICD-10-CM | POA: Diagnosis not present

## 2018-03-23 DIAGNOSIS — J329 Chronic sinusitis, unspecified: Secondary | ICD-10-CM | POA: Diagnosis not present

## 2018-03-23 DIAGNOSIS — D649 Anemia, unspecified: Secondary | ICD-10-CM | POA: Diagnosis not present

## 2018-03-23 DIAGNOSIS — M1991 Primary osteoarthritis, unspecified site: Secondary | ICD-10-CM | POA: Diagnosis not present

## 2018-03-23 DIAGNOSIS — E119 Type 2 diabetes mellitus without complications: Secondary | ICD-10-CM | POA: Diagnosis not present

## 2018-03-23 DIAGNOSIS — R69 Illness, unspecified: Secondary | ICD-10-CM | POA: Diagnosis not present

## 2018-03-23 DIAGNOSIS — J449 Chronic obstructive pulmonary disease, unspecified: Secondary | ICD-10-CM | POA: Diagnosis not present

## 2018-03-23 DIAGNOSIS — I10 Essential (primary) hypertension: Secondary | ICD-10-CM | POA: Diagnosis not present

## 2018-03-23 DIAGNOSIS — S72141D Displaced intertrochanteric fracture of right femur, subsequent encounter for closed fracture with routine healing: Secondary | ICD-10-CM | POA: Diagnosis not present

## 2018-03-25 DIAGNOSIS — M1991 Primary osteoarthritis, unspecified site: Secondary | ICD-10-CM | POA: Diagnosis not present

## 2018-03-25 DIAGNOSIS — I2510 Atherosclerotic heart disease of native coronary artery without angina pectoris: Secondary | ICD-10-CM | POA: Diagnosis not present

## 2018-03-25 DIAGNOSIS — E119 Type 2 diabetes mellitus without complications: Secondary | ICD-10-CM | POA: Diagnosis not present

## 2018-03-25 DIAGNOSIS — Z9981 Dependence on supplemental oxygen: Secondary | ICD-10-CM | POA: Diagnosis not present

## 2018-03-25 DIAGNOSIS — J449 Chronic obstructive pulmonary disease, unspecified: Secondary | ICD-10-CM | POA: Diagnosis not present

## 2018-03-25 DIAGNOSIS — R69 Illness, unspecified: Secondary | ICD-10-CM | POA: Diagnosis not present

## 2018-03-25 DIAGNOSIS — S72141D Displaced intertrochanteric fracture of right femur, subsequent encounter for closed fracture with routine healing: Secondary | ICD-10-CM | POA: Diagnosis not present

## 2018-03-25 DIAGNOSIS — J329 Chronic sinusitis, unspecified: Secondary | ICD-10-CM | POA: Diagnosis not present

## 2018-03-25 DIAGNOSIS — D649 Anemia, unspecified: Secondary | ICD-10-CM | POA: Diagnosis not present

## 2018-03-25 DIAGNOSIS — I10 Essential (primary) hypertension: Secondary | ICD-10-CM | POA: Diagnosis not present

## 2018-03-30 DIAGNOSIS — I10 Essential (primary) hypertension: Secondary | ICD-10-CM | POA: Diagnosis not present

## 2018-03-30 DIAGNOSIS — I2510 Atherosclerotic heart disease of native coronary artery without angina pectoris: Secondary | ICD-10-CM | POA: Diagnosis not present

## 2018-03-30 DIAGNOSIS — R69 Illness, unspecified: Secondary | ICD-10-CM | POA: Diagnosis not present

## 2018-03-30 DIAGNOSIS — J449 Chronic obstructive pulmonary disease, unspecified: Secondary | ICD-10-CM | POA: Diagnosis not present

## 2018-03-30 DIAGNOSIS — M1991 Primary osteoarthritis, unspecified site: Secondary | ICD-10-CM | POA: Diagnosis not present

## 2018-03-30 DIAGNOSIS — S72141D Displaced intertrochanteric fracture of right femur, subsequent encounter for closed fracture with routine healing: Secondary | ICD-10-CM | POA: Diagnosis not present

## 2018-03-30 DIAGNOSIS — D649 Anemia, unspecified: Secondary | ICD-10-CM | POA: Diagnosis not present

## 2018-03-30 DIAGNOSIS — J329 Chronic sinusitis, unspecified: Secondary | ICD-10-CM | POA: Diagnosis not present

## 2018-03-30 DIAGNOSIS — E119 Type 2 diabetes mellitus without complications: Secondary | ICD-10-CM | POA: Diagnosis not present

## 2018-03-30 DIAGNOSIS — Z9981 Dependence on supplemental oxygen: Secondary | ICD-10-CM | POA: Diagnosis not present

## 2018-04-01 DIAGNOSIS — D649 Anemia, unspecified: Secondary | ICD-10-CM | POA: Diagnosis not present

## 2018-04-01 DIAGNOSIS — J329 Chronic sinusitis, unspecified: Secondary | ICD-10-CM | POA: Diagnosis not present

## 2018-04-01 DIAGNOSIS — R69 Illness, unspecified: Secondary | ICD-10-CM | POA: Diagnosis not present

## 2018-04-01 DIAGNOSIS — S72141D Displaced intertrochanteric fracture of right femur, subsequent encounter for closed fracture with routine healing: Secondary | ICD-10-CM | POA: Diagnosis not present

## 2018-04-01 DIAGNOSIS — J449 Chronic obstructive pulmonary disease, unspecified: Secondary | ICD-10-CM | POA: Diagnosis not present

## 2018-04-01 DIAGNOSIS — I10 Essential (primary) hypertension: Secondary | ICD-10-CM | POA: Diagnosis not present

## 2018-04-01 DIAGNOSIS — I2510 Atherosclerotic heart disease of native coronary artery without angina pectoris: Secondary | ICD-10-CM | POA: Diagnosis not present

## 2018-04-01 DIAGNOSIS — E119 Type 2 diabetes mellitus without complications: Secondary | ICD-10-CM | POA: Diagnosis not present

## 2018-04-01 DIAGNOSIS — M1991 Primary osteoarthritis, unspecified site: Secondary | ICD-10-CM | POA: Diagnosis not present

## 2018-04-01 DIAGNOSIS — Z9981 Dependence on supplemental oxygen: Secondary | ICD-10-CM | POA: Diagnosis not present

## 2018-04-03 ENCOUNTER — Ambulatory Visit: Payer: Medicare HMO | Admitting: "Endocrinology

## 2018-04-03 ENCOUNTER — Encounter: Payer: Self-pay | Admitting: "Endocrinology

## 2018-04-03 VITALS — BP 140/88 | HR 58 | Ht 72.0 in

## 2018-04-03 DIAGNOSIS — E782 Mixed hyperlipidemia: Secondary | ICD-10-CM | POA: Diagnosis not present

## 2018-04-03 DIAGNOSIS — E1165 Type 2 diabetes mellitus with hyperglycemia: Secondary | ICD-10-CM | POA: Diagnosis not present

## 2018-04-03 DIAGNOSIS — Z794 Long term (current) use of insulin: Secondary | ICD-10-CM

## 2018-04-03 DIAGNOSIS — E118 Type 2 diabetes mellitus with unspecified complications: Secondary | ICD-10-CM | POA: Diagnosis not present

## 2018-04-03 DIAGNOSIS — I10 Essential (primary) hypertension: Secondary | ICD-10-CM | POA: Diagnosis not present

## 2018-04-03 DIAGNOSIS — IMO0002 Reserved for concepts with insufficient information to code with codable children: Secondary | ICD-10-CM

## 2018-04-03 NOTE — Progress Notes (Signed)
Subjective:    Patient ID: Joshua Fletcher, male    DOB: 07/22/1936, PCP Sinda Du, MD   Past Medical History:  Diagnosis Date  . Arthritis   . Constipation   . COPD (chronic obstructive pulmonary disease) ([place])   . Coronary artery disease   . Diabetes mellitus   . Hypertension   . Skin cancer of face    Past Surgical History:  Procedure Laterality Date  . APPENDECTOMY  1958  . BACK SURGERY    . CORONARY STENT PLACEMENT    . FEMUR IM NAIL Right 08/23/2017   Procedure: INTRAMEDULLARY (IM) NAIL FEMORAL;  Surgeon: Nicholes Stairs, MD;  Location: [facility];  Service: Orthopedics;  Laterality: Right;  . SKIN CANCER EXCISION  07/2017   Social History   Socioeconomic History  . Marital status: Married    Spouse name: Not on file  . Number of children: Not on file  . Years of education: Not on file  . Highest education level: Not on file  Occupational History  . Not on file  Social Needs  . Financial resource strain: Not on file  . Food insecurity:    Worry: Not on file    Inability: Not on file  . Transportation needs:    Medical: Not on file    Non-medical: Not on file  Tobacco Use  . Smoking status: Current Every Day Smoker    Packs/day: 1.00    Years: 25.00    Pack years: 25.00    Types: Cigarettes  . Smokeless tobacco: Never Used  Substance and Sexual Activity  . Alcohol use: No  . Drug use: No  . Sexual activity: Not on file  Lifestyle  . Physical activity:    Days per week: Not on file    Minutes per session: Not on file  . Stress: Not on file  Relationships  . Social connections:    Talks on phone: Not on file    Gets together: Not on file    Attends religious service: Not on file    Active member of club or organization: Not on file    Attends meetings of clubs or organizations: Not on file    Relationship status: Not on file  Other Topics Concern  . Not on file  Social History Narrative  . Not on file   Outpatient Encounter  Medications as of 04/03/2018  Medication Sig  . acetaminophen (TYLENOL) 325 MG tablet Take 650 mg by mouth every 6 (six) hours as needed.  Marland Kitchen aspirin EC 81 MG tablet Take 81 mg by mouth daily.   . cholecalciferol (VITAMIN D) 1000 units tablet Take 5,000 Units by mouth daily.  Marland Kitchen gabapentin (NEURONTIN) 300 MG capsule Take 1 capsule (300 mg total) by mouth at bedtime. (Patient taking differently: Take 300 mg by mouth 3 (three) times daily. )  . insulin degludec (TRESIBA FLEXTOUCH) 100 UNIT/ML SOPN FlexTouch Pen Inject 0.3 mLs (30 Units total) into the skin daily at 10 pm.  . Insulin Pen Needle (B-D ULTRAFINE III SHORT PEN) 31G X 8 MM MISC 1 each by Does not apply route as directed.  . Melatonin 3 MG TABS Take 3 mg by mouth at bedtime.  . metoprolol tartrate (LOPRESSOR) 50 MG tablet Take 50 mg by mouth 2 (two) times daily.   . Misc Natural Products (OSTEO BI-FLEX ADV TRIPLE ST PO) Take 1 tablet by mouth once a day  . Multiple Vitamin (MULTIVITAMIN WITH MINERALS) TABS tablet Take  1 tablet by mouth daily.  Marland Kitchen Phenyleph-CPM-DM-Aspirin (ALKA-SELTZER PLUS COLD & COUGH PO) Take 2 tablets by mouth daily as needed (nasal congestion).  . simvastatin (ZOCOR) 40 MG tablet Take 40 mg by mouth daily.  . tamsulosin (FLOMAX) 0.4 MG CAPS capsule Take 0.4 mg by mouth.  . trolamine salicylate (ASPERCREME) 10 % cream Apply 1 application topically as needed for muscle pain.   No facility-administered encounter medications on file as of 04/03/2018.    ALLERGIES: No Known Allergies VACCINATION STATUS: Immunization History  Administered Date(s) Administered  . Influenza-Unspecified 08/11/2017  . Pneumococcal Conjugate-13 10/06/2017  . Tdap 10/06/2017    Diabetes  He presents for his follow-up diabetic visit. He has type 2 diabetes mellitus. Onset time: He was diagnosed at approximate age of 75 years. His disease course has been worsening. There are no hypoglycemic associated symptoms. Pertinent negatives for  hypoglycemia include no confusion, headaches, pallor or seizures. Pertinent negatives for diabetes include no chest pain, no fatigue, no polydipsia, no polyphagia, no polyuria and no weakness. There are no hypoglycemic complications. Symptoms are worsening. Diabetic complications include heart disease. Risk factors for coronary artery disease include diabetes mellitus, dyslipidemia, hypertension, male sex and sedentary lifestyle. Current diabetic treatment includes oral agent (monotherapy). He is following a generally unhealthy diet. When asked about meal planning, he reported none. He has not had a previous visit with a dietitian. Exercise: He is currently on physical therapy related to his recent hip surgery. Blood glucose monitoring compliance is inadequate. There is no change in his home blood glucose trend. His overall blood glucose range is >200 mg/dl. An ACE inhibitor/angiotensin II receptor blocker is being taken. Eye exam is current.    He has reported complications including CAD which required stent placement, CHF,  and likely retinopathy. Pt is also being treated for neuropathy, pain syndrome, HTN and HPL. he continues to smoke 1PPD and he is not ready to quit. no other interval problems reported.  Review of Systems  Constitutional: Negative for fatigue and unexpected weight change.  HENT: Negative for dental problem, mouth sores and trouble swallowing.   Eyes: Negative for visual disturbance.  Respiratory: Negative for cough, choking, chest tightness, shortness of breath and wheezing.   Cardiovascular: Negative for chest pain, palpitations and leg swelling.  Gastrointestinal: Negative for abdominal distention, abdominal pain, constipation, diarrhea, nausea and vomiting.  Endocrine: Negative for polydipsia, polyphagia and polyuria.  Genitourinary: Negative for dysuria, flank pain, hematuria and urgency.  Musculoskeletal: Negative for back pain, gait problem, myalgias and neck pain.  Skin:  Negative for pallor, rash and wound.  Neurological: Negative for seizures, syncope, weakness, numbness and headaches.  Psychiatric/Behavioral: Negative.  Negative for confusion and dysphoric mood.    Objective:    BP 140/88   Pulse (!) 58   Wt Readings from Last 3 Encounters:  02/16/18 201 lb (91.2 kg)  12/05/17 183 lb (83 kg)  11/27/17 172 lb 12.8 oz (78.4 kg)    Physical Exam  Constitutional: He is oriented to person, place, and time. He appears well-developed. He is cooperative. No distress.  Walks with a walker.  HENT:  Head: Normocephalic and atraumatic.  Eyes: EOM are normal.  Neck: Normal range of motion. Neck supple. No tracheal deviation present. No thyromegaly present.  Cardiovascular: Normal rate, S1 normal and S2 normal. Exam reveals no gallop.  No murmur heard. Pulses:      Dorsalis pedis pulses are 1+ on the right side, and 1+ on the left side.  Posterior tibial pulses are 1+ on the right side, and 1+ on the left side.  Pulmonary/Chest: No respiratory distress. He has no wheezes.  Abdominal: He exhibits no distension. There is no tenderness. There is no guarding and no CVA tenderness.  Musculoskeletal: He exhibits no edema.       Right shoulder: He exhibits no swelling and no deformity.  Neurological: He is alert and oriented to person, place, and time. He has normal strength and normal reflexes. No cranial nerve deficit or sensory deficit. Gait normal.  Skin: Skin is warm and dry. No rash noted. No cyanosis. Nails show no clubbing.  Psychiatric: He has a normal mood and affect. His speech is normal. Cognition and memory are normal.  Patient seems to be declining cognitively.    Results for orders placed or performed during the hospital encounter of 41/32/44  Basic metabolic panel  Result Value Ref Range   Sodium 137 135 - 145 mmol/L   Potassium 4.8 3.5 - 5.1 mmol/L   Chloride 103 101 - 111 mmol/L   CO2 26 22 - 32 mmol/L   Glucose, Bld 189 (H) 65 - 99  mg/dL   BUN 26 (H) 6 - 20 mg/dL   Creatinine, Ser 0.95 0.61 - 1.24 mg/dL   Calcium 8.9 8.9 - 10.3 mg/dL   GFR calc non Af Amer >60 >60 mL/min   GFR calc Af Amer >60 >60 mL/min   Anion gap 8 5 - 15  CBC with Differential/Platelet  Result Value Ref Range   WBC 5.9 4.0 - 10.5 K/uL   RBC 3.75 (L) 4.22 - 5.81 MIL/uL   Hemoglobin 11.1 (L) 13.0 - 17.0 g/dL   HCT 35.2 (L) 39.0 - 52.0 %   MCV 93.9 78.0 - 100.0 fL   MCH 29.6 26.0 - 34.0 pg   MCHC 31.5 30.0 - 36.0 g/dL   RDW 14.8 11.5 - 15.5 %   Platelets 160 150 - 400 K/uL   Neutrophils Relative % 50 %   Neutro Abs 3.0 1.7 - 7.7 K/uL   Lymphocytes Relative 33 %   Lymphs Abs 1.9 0.7 - 4.0 K/uL   Monocytes Relative 9 %   Monocytes Absolute 0.5 0.1 - 1.0 K/uL   Eosinophils Relative 7 %   Eosinophils Absolute 0.4 0.0 - 0.7 K/uL   Basophils Relative 1 %   Basophils Absolute 0.0 0.0 - 0.1 K/uL   Complete Blood Count (Most recent): Lab Results  Component Value Date   WBC 5.9 12/07/2017   HGB 11.1 (L) 12/07/2017   HCT 35.2 (L) 12/07/2017   MCV 93.9 12/07/2017   PLT 160 12/07/2017   Chemistry (most recent): Lab Results  Component Value Date   NA 138 02/09/2018   K 5.3 02/09/2018   CL 107 02/09/2018   CO2 25 02/09/2018   BUN 34 (H) 02/09/2018   CREATININE 1.48 (H) 02/09/2018   Lipid Panel     Component Value Date/Time   CHOL 110 10/09/2017 0400   TRIG 107 10/09/2017 0400   HDL 48 10/09/2017 0400   CHOLHDL 2.3 10/09/2017 0400   VLDL 21 10/09/2017 0400   LDLCALC 41 10/09/2017 0400   Assessment & Plan:   1. Uncontrolled type 2 diabetes mellitus with complication, with long-term current use of insulin ([place]) -His diabetes is  complicated by CAD and CKD and patient remains at a high risk for more acute and chronic complications of diabetes which include CAD, CVA, CKD, retinopathy, and neuropathy. These are all discussed  in detail with the patient.  He recently had hospitalization and stay in rehab center related to hip surgery.   -During his last visit, he was put on readjusted dose of Tresiba at 30 units nightly.  Unfortunately he did not monitor blood glucose even 1 time a day.  He monitors randomly and rarely, average still above 200 mg/dL.    -There is one documented hypoglycemia of 50. -During his recent visit, A1c was 8%.    Recent labs reviewed, showing stage 3 renal insufficiency.  - I have re-counseled the patient on diet management  by adopting a carbohydrate restricted / protein rich  Diet.  -  Suggestion is made for him to avoid simple carbohydrates  from his diet including Cakes, Sweet Desserts / Pastries, Ice Cream, Soda (diet and regular), Sweet Tea, Candies, Chips, Cookies, Store Bought Juices, Alcohol in Excess of  1-2 drinks a day, Artificial Sweeteners, and "Sugar-free" Products. This will help patient to have stable blood glucose profile and potentially avoid unintended weight gain.  - Patient is advised to stick to a routine mealtimes to eat 3 meals  a day and avoid unnecessary snacks ( to snack only to correct hypoglycemia).   - I have approached patient with the following individualized plan to manage diabetes and patient agrees.  -Based on his presentation with severe hyperglycemia above target, he will need to stay on insulin treatment.   -However, he is disencouraged from proper monitoring of blood glucose.  He only monitors rarely and randomly. -#1 goal in treating his diabetes is to avoid hypoglycemia. -I asked him to return with his daughter next visit, continue Tresiba 30 units nightly, and resume monitoring at least one time a day before breakfast.    -He is advised to call clinic if he registers blood glucose below 70 or above 200 x 3 . - I have advised him to hold NovoLog and metformin for now.    - Patient specific target  for A1c; LDL, HDL, Triglycerides, and  Waist Circumference were discussed in detail.  2) BP/HTN: His blood pressure is above target.  He will not need  additional treatment.  I have advised him to continue his current blood pressure medications including ACEI/ARB. 3) Lipids/HPL: Controlled lipid panel with LDL of 41, continue statins. 4)  Weight/Diet: Currently gaining weight, cannot exercise optimally recovering from recent hip surgery.  CDE consult in progress, exercise, and carbohydrates information provided.  5) Chronic Care/Health Maintenance:  -Patient is on ACEI/ARB and Statin medications and encouraged to continue to follow up with Ophthalmology, Podiatrist at least yearly or according to recommendations, and advised to quit smoking. I have recommended yearly flu vaccine and pneumonia vaccination at least every 5 years;  and  sleep for at least 7 hours a day.  - I advised patient to maintain close follow up with Sinda Du, MD for primary care needs.  - Time spent with the patient: 25 min, of which >50% was spent in reviewing his blood glucose logs , discussing his hypo- and hyper-glycemic episodes, reviewing his current and  previous labs and insulin doses and developing a plan to avoid hypo- and hyper-glycemia. Please refer to Patient Instructions for Blood Glucose Monitoring and Insulin/Medications Dosing Guide"  in media tab for additional information. Keyvin L Cleaver participated in the discussions, expressed understanding, and voiced agreement with the above plans.  All questions were answered to his satisfaction. he is encouraged to contact clinic should he have any questions or concerns prior to  his return visit.   Follow up plan: -Return in about 2 months (around 06/12/2018) for meter, and logs.  Glade Lloyd, MD Phone: [phone]  Fax: [phone]   This note was partially dictated with voice recognition software. Similar sounding words can be transcribed inadequately or may not  be corrected upon review.  04/03/2018, 11:15 AM

## 2018-04-07 ENCOUNTER — Other Ambulatory Visit: Payer: Self-pay | Admitting: "Endocrinology

## 2018-04-08 ENCOUNTER — Other Ambulatory Visit: Payer: Self-pay

## 2018-04-08 MED ORDER — BLOOD GLUCOSE MONITOR KIT: PACK | 3 refills | Status: AC

## 2018-04-15 DIAGNOSIS — R69 Illness, unspecified: Secondary | ICD-10-CM | POA: Diagnosis not present

## 2018-04-16 ENCOUNTER — Other Ambulatory Visit: Payer: Self-pay

## 2018-04-16 MED ORDER — GLUCOSE BLOOD VI STRP: ORAL_STRIP | 5 refills | Status: AC

## 2018-05-20 DIAGNOSIS — E1151 Type 2 diabetes mellitus with diabetic peripheral angiopathy without gangrene: Secondary | ICD-10-CM | POA: Diagnosis not present

## 2018-05-20 DIAGNOSIS — E1140 Type 2 diabetes mellitus with diabetic neuropathy, unspecified: Secondary | ICD-10-CM | POA: Diagnosis not present

## 2018-05-27 DIAGNOSIS — I2510 Atherosclerotic heart disease of native coronary artery without angina pectoris: Secondary | ICD-10-CM | POA: Diagnosis not present

## 2018-05-27 DIAGNOSIS — E1169 Type 2 diabetes mellitus with other specified complication: Secondary | ICD-10-CM | POA: Diagnosis not present

## 2018-05-27 DIAGNOSIS — I10 Essential (primary) hypertension: Secondary | ICD-10-CM | POA: Diagnosis not present

## 2018-05-27 DIAGNOSIS — J449 Chronic obstructive pulmonary disease, unspecified: Secondary | ICD-10-CM | POA: Diagnosis not present

## 2018-06-02 DIAGNOSIS — E118 Type 2 diabetes mellitus with unspecified complications: Secondary | ICD-10-CM | POA: Diagnosis not present

## 2018-06-02 DIAGNOSIS — Z794 Long term (current) use of insulin: Secondary | ICD-10-CM | POA: Diagnosis not present

## 2018-06-02 DIAGNOSIS — E1165 Type 2 diabetes mellitus with hyperglycemia: Secondary | ICD-10-CM | POA: Diagnosis not present

## 2018-06-03 LAB — COMPLETE METABOLIC PANEL WITH GFR
AG RATIO: 1.2 (calc) (ref 1.0–2.5)
ALT: 10 U/L (ref 9–46)
AST: 12 U/L (ref 10–35)
Albumin: 3.6 g/dL (ref 3.6–5.1)
Alkaline phosphatase (APISO): 81 U/L (ref 40–115)
BUN/Creatinine Ratio: 26 (calc) — ABNORMAL HIGH (ref 6–22)
BUN: 37 mg/dL — ABNORMAL HIGH (ref 7–25)
CALCIUM: 9.1 mg/dL (ref 8.6–10.3)
CO2: 26 mmol/L (ref 20–32)
CREATININE: 1.45 mg/dL — AB (ref 0.70–1.18)
Chloride: 104 mmol/L (ref 98–110)
GFR, EST AFRICAN AMERICAN: 53 mL/min/{1.73_m2} — AB (ref >=60)
GFR, EST NON AFRICAN AMERICAN: 45 mL/min/{1.73_m2} — AB (ref >=60)
GLOBULIN: 3 g/dL (ref 1.9–3.7)
Glucose, Bld: 253 mg/dL — ABNORMAL HIGH (ref 65–99)
POTASSIUM: 5 mmol/L (ref 3.5–5.3)
Sodium: 137 mmol/L (ref 135–146)
TOTAL PROTEIN: 6.6 g/dL (ref 6.1–8.1)
Total Bilirubin: 0.7 mg/dL (ref 0.2–1.2)

## 2018-06-03 LAB — HEMOGLOBIN A1C
EAG (MMOL/L): 13 (calc)
Hgb A1c MFr Bld: 9.8 % of total Hgb — ABNORMAL HIGH (ref <5.7)
MEAN PLASMA GLUCOSE: 235 (calc)

## 2018-06-11 ENCOUNTER — Encounter: Payer: Self-pay | Admitting: "Endocrinology

## 2018-06-11 ENCOUNTER — Ambulatory Visit (INDEPENDENT_AMBULATORY_CARE_PROVIDER_SITE_OTHER): Payer: Medicare HMO | Admitting: "Endocrinology

## 2018-06-11 VITALS — BP 136/82 | HR 64 | Ht 72.0 in | Wt 218.0 lb

## 2018-06-11 DIAGNOSIS — E782 Mixed hyperlipidemia: Secondary | ICD-10-CM

## 2018-06-11 DIAGNOSIS — E1165 Type 2 diabetes mellitus with hyperglycemia: Secondary | ICD-10-CM | POA: Diagnosis not present

## 2018-06-11 DIAGNOSIS — Z794 Long term (current) use of insulin: Secondary | ICD-10-CM

## 2018-06-11 DIAGNOSIS — I10 Essential (primary) hypertension: Secondary | ICD-10-CM

## 2018-06-11 DIAGNOSIS — E118 Type 2 diabetes mellitus with unspecified complications: Secondary | ICD-10-CM

## 2018-06-11 DIAGNOSIS — IMO0002 Reserved for concepts with insufficient information to code with codable children: Secondary | ICD-10-CM

## 2018-06-11 MED ORDER — INSULIN DEGLUDEC 100 UNIT/ML ~~LOC~~ SOPN: 40.0000 [IU] | PEN_INJECTOR | Freq: Every day | SUBCUTANEOUS | 2 refills | Status: DC

## 2018-06-11 MED ORDER — INSULIN DEGLUDEC 100 UNIT/ML ~~LOC~~ SOPN: 30.0000 [IU] | PEN_INJECTOR | Freq: Every day | SUBCUTANEOUS | 2 refills | Status: DC

## 2018-06-11 NOTE — Progress Notes (Signed)
Subjective:    Patient ID: Joshua Fletcher, male    DOB: December 10, 1935, PCP Sinda Du, MD   Past Medical History:  Diagnosis Date  . Arthritis   . Constipation   . COPD (chronic obstructive pulmonary disease) ([place])   . Coronary artery disease   . Diabetes mellitus   . Hypertension   . Skin cancer of face    Past Surgical History:  Procedure Laterality Date  . APPENDECTOMY  1958  . BACK SURGERY    . CORONARY STENT PLACEMENT    . FEMUR IM NAIL Right 08/23/2017   Procedure: INTRAMEDULLARY (IM) NAIL FEMORAL;  Surgeon: Nicholes Stairs, MD;  Location: [facility];  Service: Orthopedics;  Laterality: Right;  . SKIN CANCER EXCISION  07/2017   Social History   Socioeconomic History  . Marital status: Married    Spouse name: Not on file  . Number of children: Not on file  . Years of education: Not on file  . Highest education level: Not on file  Occupational History  . Not on file  Social Needs  . Financial resource strain: Not on file  . Food insecurity:    Worry: Not on file    Inability: Not on file  . Transportation needs:    Medical: Not on file    Non-medical: Not on file  Tobacco Use  . Smoking status: Current Every Day Smoker    Packs/day: 1.00    Years: 25.00    Pack years: 25.00    Types: Cigarettes  . Smokeless tobacco: Never Used  Substance and Sexual Activity  . Alcohol use: No  . Drug use: No  . Sexual activity: Not on file  Lifestyle  . Physical activity:    Days per week: Not on file    Minutes per session: Not on file  . Stress: Not on file  Relationships  . Social connections:    Talks on phone: Not on file    Gets together: Not on file    Attends religious service: Not on file    Active member of club or organization: Not on file    Attends meetings of clubs or organizations: Not on file    Relationship status: Not on file  Other Topics Concern  . Not on file  Social History Narrative  . Not on file   Outpatient Encounter  Medications as of 06/11/2018  Medication Sig  . acetaminophen (TYLENOL) 325 MG tablet Take 650 mg by mouth every 6 (six) hours as needed.  Marland Kitchen aspirin EC 81 MG tablet Take 81 mg by mouth daily.   . blood glucose meter kit and supplies KIT Dispense based on patient and insurance preference. Use up to four times daily as directed. (FOR ICD-10 E11.65)  . cholecalciferol (VITAMIN D) 1000 units tablet Take 5,000 Units by mouth daily.  Marland Kitchen gabapentin (NEURONTIN) 300 MG capsule Take 1 capsule (300 mg total) by mouth at bedtime. (Patient taking differently: Take 300 mg by mouth 3 (three) times daily. )  . glucose blood (ONE TOUCH ULTRA TEST) test strip TEST 3 TIMES DAILY.  Marland Kitchen insulin degludec (TRESIBA FLEXTOUCH) 100 UNIT/ML SOPN FlexTouch Pen Inject 0.4 mLs (40 Units total) into the skin daily at 10 pm.  . Insulin Pen Needle (B-D ULTRAFINE III SHORT PEN) 31G X 8 MM MISC 1 each by Does not apply route as directed.  . Melatonin 3 MG TABS Take 3 mg by mouth at bedtime.  . metoprolol tartrate (LOPRESSOR) 50  MG tablet Take 50 mg by mouth 2 (two) times daily.   . Misc Natural Products (OSTEO BI-FLEX ADV TRIPLE ST PO) Take 1 tablet by mouth once a day  . Multiple Vitamin (MULTIVITAMIN WITH MINERALS) TABS tablet Take 1 tablet by mouth daily.  Marland Kitchen Phenyleph-CPM-DM-Aspirin (ALKA-SELTZER PLUS COLD & COUGH PO) Take 2 tablets by mouth daily as needed (nasal congestion).  . simvastatin (ZOCOR) 40 MG tablet Take 40 mg by mouth daily.  . tamsulosin (FLOMAX) 0.4 MG CAPS capsule Take 0.4 mg by mouth.  . trolamine salicylate (ASPERCREME) 10 % cream Apply 1 application topically as needed for muscle pain.  . [DISCONTINUED] insulin degludec (TRESIBA FLEXTOUCH) 100 UNIT/ML SOPN FlexTouch Pen Inject 0.3 mLs (30 Units total) into the skin daily at 10 pm.  . [DISCONTINUED] insulin degludec (TRESIBA FLEXTOUCH) 100 UNIT/ML SOPN FlexTouch Pen Inject 0.3 mLs (30 Units total) into the skin daily at 10 pm.   No facility-administered  encounter medications on file as of 06/11/2018.    ALLERGIES: No Known Allergies VACCINATION STATUS: Immunization History  Administered Date(s) Administered  . Influenza-Unspecified 08/11/2017  . Pneumococcal Conjugate-13 10/06/2017  . Tdap 10/06/2017    Diabetes  He presents for his follow-up diabetic visit. He has type 2 diabetes mellitus. Onset time: He was diagnosed at approximate age of 89 years. His disease course has been worsening. There are no hypoglycemic associated symptoms. Pertinent negatives for hypoglycemia include no confusion, headaches, pallor or seizures. Pertinent negatives for diabetes include no chest pain, no fatigue, no polydipsia, no polyphagia, no polyuria and no weakness. There are no hypoglycemic complications. Symptoms are worsening. Diabetic complications include heart disease, peripheral neuropathy and retinopathy. (He reports complications including coronary artery disease which required stent placement, congestive heart failure.) Risk factors for coronary artery disease include diabetes mellitus, dyslipidemia, hypertension, male sex, sedentary lifestyle and tobacco exposure. Current diabetic treatment includes insulin injections. His weight is increasing steadily. He is following a generally unhealthy diet. When asked about meal planning, he reported none. He has not had a previous visit with a dietitian. Blood glucose monitoring compliance is inadequate. His home blood glucose trend is increasing steadily. His overall blood glucose range is >200 mg/dl. (He returns with higher A1c of 9.8% indicating loss of control of his diabetes.) An ACE inhibitor/angiotensin II receptor blocker is being taken. Eye exam is current.     Review of Systems  Constitutional: Negative for fatigue and unexpected weight change.  HENT: Negative for dental problem, mouth sores and trouble swallowing.   Eyes: Negative for visual disturbance.  Respiratory: Negative for cough, choking, chest  tightness, shortness of breath and wheezing.   Cardiovascular: Negative for chest pain, palpitations and leg swelling.  Gastrointestinal: Negative for abdominal distention, abdominal pain, constipation, diarrhea, nausea and vomiting.  Endocrine: Negative for polydipsia, polyphagia and polyuria.  Genitourinary: Negative for dysuria, flank pain, hematuria and urgency.  Musculoskeletal: Positive for gait problem. Negative for back pain, myalgias and neck pain.  Skin: Negative for pallor, rash and wound.  Neurological: Negative for seizures, syncope, weakness, numbness and headaches.  Psychiatric/Behavioral: Negative for confusion and dysphoric mood.    Objective:    BP 136/82   Pulse 64   Ht 6' (1.829 m)   Wt 218 lb (98.9 kg)   BMI 29.57 kg/m   Wt Readings from Last 3 Encounters:  06/11/18 218 lb (98.9 kg)  02/16/18 201 lb (91.2 kg)  12/05/17 183 lb (83 kg)    Physical Exam  Constitutional: He is oriented  to person, place, and time. He appears well-developed. He is cooperative. No distress.  Walks with a cane.  HENT:  Head: Normocephalic and atraumatic.  Eyes: EOM are normal.  Neck: Normal range of motion. Neck supple. No tracheal deviation present. No thyromegaly present.  Cardiovascular: Normal rate, S1 normal and S2 normal. Exam reveals no gallop.  No murmur heard. Pulses:      Dorsalis pedis pulses are 1+ on the right side, and 1+ on the left side.       Posterior tibial pulses are 1+ on the right side, and 1+ on the left side.  Pulmonary/Chest: Effort normal. No respiratory distress. He has no wheezes.  Abdominal: He exhibits no distension. There is no tenderness. There is no guarding and no CVA tenderness.  Musculoskeletal: He exhibits no edema.       Right shoulder: He exhibits no swelling and no deformity.  Neurological: He is alert and oriented to person, place, and time. He has normal strength and normal reflexes. No cranial nerve deficit or sensory deficit. Gait  normal.  Skin: Skin is warm and dry. No rash noted. No cyanosis. Nails show no clubbing.  Psychiatric: He has a normal mood and affect. His speech is normal. Cognition and memory are normal.  He has a better cognitive function today compared to his last visit.    Results for orders placed or performed in visit on 04/03/18  COMPLETE METABOLIC PANEL WITH GFR  Result Value Ref Range   Glucose, Bld 253 (H) 65 - 99 mg/dL   BUN 37 (H) 7 - 25 mg/dL   Creat 1.45 (H) 0.70 - 1.18 mg/dL   GFR, Est Non African American 45 (L) > OR = 60 mL/min/1.29m   GFR, Est African American 53 (L) > OR = 60 mL/min/1.726m  BUN/Creatinine Ratio 26 (H) 6 - 22 (calc)   Sodium 137 135 - 146 mmol/L   Potassium 5.0 3.5 - 5.3 mmol/L   Chloride 104 98 - 110 mmol/L   CO2 26 20 - 32 mmol/L   Calcium 9.1 8.6 - 10.3 mg/dL   Total Protein 6.6 6.1 - 8.1 g/dL   Albumin 3.6 3.6 - 5.1 g/dL   Globulin 3.0 1.9 - 3.7 g/dL (calc)   AG Ratio 1.2 1.0 - 2.5 (calc)   Total Bilirubin 0.7 0.2 - 1.2 mg/dL   Alkaline phosphatase (APISO) 81 40 - 115 U/L   AST 12 10 - 35 U/L   ALT 10 9 - 46 U/L  Hemoglobin A1c  Result Value Ref Range   Hgb A1c MFr Bld 9.8 (H) <5.7 % of total Hgb   Mean Plasma Glucose 235 (calc)   eAG (mmol/L) 13.0 (calc)   Complete Blood Count (Most recent): Lab Results  Component Value Date   WBC 5.9 12/07/2017   HGB 11.1 (L) 12/07/2017   HCT 35.2 (L) 12/07/2017   MCV 93.9 12/07/2017   PLT 160 12/07/2017   Lipid Panel     Component Value Date/Time   CHOL 110 10/09/2017 0400   TRIG 107 10/09/2017 0400   HDL 48 10/09/2017 0400   CHOLHDL 2.3 10/09/2017 0400   VLDL 21 10/09/2017 0400   LDLCALC 41 10/09/2017 0400   Assessment & Plan:   1. Uncontrolled type 2 diabetes mellitus with complication, with long-term current use of insulin ([place] -His diabetes is  complicated by CAD and CKD and patient remains at a high risk for more acute and chronic complications of diabetes which include CAD,  CVA, CKD,  retinopathy, and neuropathy. These are all discussed in detail with the patient.   -He presents with higher A1c of 9.8%, inadequate monitoring of blood glucose.  Denies any hypoglycemia.      Recent labs reviewed, showing stage 3 renal insufficiency.  - I have re-counseled the patient on diet management  by adopting a carbohydrate restricted / protein rich  Diet.  -He admits to dietary discretion causing weight gain. -  Suggestion is made for him to avoid simple carbohydrates  from his diet including Cakes, Sweet Desserts / Pastries, Ice Cream, Soda (diet and regular), Sweet Tea, Candies, Chips, Cookies, Store Bought Juices, Alcohol in Excess of  1-2 drinks a day, Artificial Sweeteners, and "Sugar-free" Products. This will help patient to have stable blood glucose profile and potentially avoid unintended weight gain.  - Patient is advised to stick to a routine mealtimes to eat 3 meals  a day and avoid unnecessary snacks ( to snack only to correct hypoglycemia).  - I have approached patient with the following individualized plan to manage diabetes and patient agrees.  -#1 goal in treating his diabetes is to avoid hypoglycemia. -Based on his presentation with severe hyperglycemia above target, he will need more insulin.  He would have required intensive treatment with basal/bolus insulin.  However patient has nonexistent social support and he is unable to execute complex insulin program.   -I will proceed to maximize his basal insulin . -I asked him to increase his [place] to 40 units nightly, start monitoring blood glucose at least 2 times a day-daily before breakfast and at bedtime and return in 4 weeks with his meter and logs for evaluation.    -I asked him to return with his daughter next visit, continue Tresiba 30 units nightly, and resume monitoring at least one time a day before breakfast.    -He is advised to call clinic if he registers blood glucose below 70 or above 200 x 3 . - I have  advised him to hold NovoLog and metformin for now.    - Patient specific target  for A1c; LDL, HDL, Triglycerides, and  Waist Circumference were discussed in detail.  2) BP/HTN: His blood pressure is controlled to target.  He is advised to continue his current blood pressure medications including metoprolol 50 mg p.o. twice daily.  He will be considered for low-dose lisinopril if he is requiring more blood pressure medications on subsequent visits.   3) Lipids/HPL: Controlled lipid panel with LDL of 41.  He is advised to continue simvastatin 40 mg p.o. nightly.    4)  Weight/Diet: Currently gaining weight, cannot exercise optimally recovering from recent hip surgery.  CDE consult in progress, exercise, and carbohydrates information provided.  5) Chronic Care/Health Maintenance:  -Patient is on ACEI/ARB and Statin medications and encouraged to continue to follow up with Ophthalmology, Podiatrist at least yearly or according to recommendations, and he is advised to quit smoking.  I have recommended yearly flu vaccine and pneumonia vaccination at least every 5 years;  and  sleep for at least 7 hours a day.  - I advised patient to maintain close follow up with Sinda Du, MD for primary care needs.  - Time spent with the patient: 25 min, of which >50% was spent in reviewing his blood glucose logs , discussing his hypo- and hyper-glycemic episodes, reviewing his current and  previous labs and insulin doses and developing a plan to avoid hypo- and hyper-glycemia. Please refer to Patient Instructions  for Blood Glucose Monitoring and Insulin/Medications Dosing Guide"  in media tab for additional information. Kanton L Harroun participated in the discussions, expressed understanding, and voiced agreement with the above plans.  All questions were answered to his satisfaction. he is encouraged to contact clinic should he have any questions or concerns prior to his return visit.   Follow up plan: -Return  in about 1 month (around 07/09/2018) for follow up with meter and logs- no labs.  Glade Lloyd, MD Phone: [phone]  Fax: [phone]   This note was partially dictated with voice recognition software. Similar sounding words can be transcribed inadequately or may not  be corrected upon review.  06/11/2018, 5:24 PM

## 2018-06-11 NOTE — Patient Instructions (Signed)
                                                      Advice for Weight Management  -For most of [place] the best way to lose weight is by diet management. Generally speaking, diet management means consuming less calories intentionally which over time brings about progressive weight loss.  This can be achieved more effectively by restricting carbohydrate consumption to the minimum possible.  More importantly, our carbohydrates sources should be unprocessed or minimally processed complex starch food items.   Sometimes, it is important to balance nutrition by increasing protein intake (animal or plant source), fruits, and vegetables.  -Sticking to a routine mealtime to eat 3 meals a day and avoiding unnecessary snacks is shown to have a big role in weight control.  -It is better to avoid simple carbohydrates including: Cakes, Sweet Desserts, Ice Cream, Soda (diet and regular), Sweet Tea, Candies, Chips, Cookies, Store Bought Juices, Alcohol in Excess of  1-2 drinks a day, Artificial Sweeteners, Doughnuts, Coffee Creamers, "Sugar-free" Products, etc, etc.  This is not a complete list...Marland Kitchen.   -Consulting with certified diabetes educators is proven to provide you with the most accurate and current information on diet.  Also, you may be  interested in discussing diet options/exchanges , we can schedule a visit with Jearld Fenton, RDN, CDE for individualized nutrition education.                                             Additional Care Considerations for Diabetes   -Diabetes he is a chronic disease.  The most important care consideration is regular follow-up with your diabetes care provider with the goal being avoiding or delaying its complications and to take advantage of advances in medications and technology.    -Type 2 diabetes is known to coexist with other important comorbidities such as high blood pressure and high cholesterol.  It is critical to control not only  the diabetes but also the high blood pressure and high cholesterol to minimize and delay the risk of complications including coronary artery disease, stroke, amputations, blindness, etc.    - Studies showed that people with diabetes will benefit from a class of medications known as ACE inhibitors and statins.  Unless there are specific reasons not to be on these medications, the standard of care is to consider getting one from these groups of medications at a minimum dose.  These medications are generally considered safe and proven to help protect the heart and the kidneys.    - People with diabetes are encouraged to initiate and maintain regular follow-up with eye doctors, foot doctors, dentists , and if necessary heart and kidney doctors.     - It is highly recommended that people with diabetes quit smoking or stay away from smoking, and get yearly  flu vaccine and pneumonia vaccine at least every 5 years.  One other important lifestyle recommendation is to ensure adequate sleep - at least 7 hours of uninterrupted sleep at night.

## 2018-07-03 ENCOUNTER — Other Ambulatory Visit: Payer: Self-pay

## 2018-07-03 ENCOUNTER — Emergency Department (HOSPITAL_COMMUNITY): Payer: Medicare HMO

## 2018-07-03 ENCOUNTER — Emergency Department (HOSPITAL_COMMUNITY)
Admission: EM | Admit: 2018-07-03 | Discharge: 2018-07-03 | Disposition: A | Discharge status: Home / Self Care | Payer: Medicare HMO | Attending: Emergency Medicine | Admitting: Emergency Medicine

## 2018-07-03 ENCOUNTER — Encounter (HOSPITAL_COMMUNITY): Payer: Self-pay | Admitting: Emergency Medicine

## 2018-07-03 DIAGNOSIS — Z87891 Personal history of nicotine dependence: Secondary | ICD-10-CM | POA: Diagnosis not present

## 2018-07-03 DIAGNOSIS — Z85828 Personal history of other malignant neoplasm of skin: Secondary | ICD-10-CM | POA: Diagnosis not present

## 2018-07-03 DIAGNOSIS — E119 Type 2 diabetes mellitus without complications: Secondary | ICD-10-CM | POA: Insufficient documentation

## 2018-07-03 DIAGNOSIS — R0602 Shortness of breath: Secondary | ICD-10-CM | POA: Diagnosis not present

## 2018-07-03 DIAGNOSIS — Z7982 Long term (current) use of aspirin: Secondary | ICD-10-CM | POA: Diagnosis not present

## 2018-07-03 DIAGNOSIS — Z794 Long term (current) use of insulin: Secondary | ICD-10-CM | POA: Diagnosis not present

## 2018-07-03 DIAGNOSIS — R5383 Other fatigue: Secondary | ICD-10-CM | POA: Insufficient documentation

## 2018-07-03 DIAGNOSIS — Z955 Presence of coronary angioplasty implant and graft: Secondary | ICD-10-CM | POA: Diagnosis not present

## 2018-07-03 DIAGNOSIS — I10 Essential (primary) hypertension: Secondary | ICD-10-CM | POA: Diagnosis not present

## 2018-07-03 DIAGNOSIS — S72141D Displaced intertrochanteric fracture of right femur, subsequent encounter for closed fracture with routine healing: Secondary | ICD-10-CM | POA: Diagnosis not present

## 2018-07-03 DIAGNOSIS — I2510 Atherosclerotic heart disease of native coronary artery without angina pectoris: Secondary | ICD-10-CM | POA: Insufficient documentation

## 2018-07-03 DIAGNOSIS — J189 Pneumonia, unspecified organism: Secondary | ICD-10-CM | POA: Diagnosis not present

## 2018-07-03 DIAGNOSIS — Z79899 Other long term (current) drug therapy: Secondary | ICD-10-CM | POA: Diagnosis not present

## 2018-07-03 DIAGNOSIS — R918 Other nonspecific abnormal finding of lung field: Secondary | ICD-10-CM | POA: Diagnosis not present

## 2018-07-03 DIAGNOSIS — R05 Cough: Secondary | ICD-10-CM | POA: Diagnosis present

## 2018-07-03 DIAGNOSIS — R0902 Hypoxemia: Secondary | ICD-10-CM | POA: Diagnosis not present

## 2018-07-03 DIAGNOSIS — J449 Chronic obstructive pulmonary disease, unspecified: Secondary | ICD-10-CM | POA: Diagnosis not present

## 2018-07-03 LAB — CBC WITH DIFFERENTIAL/PLATELET
BASOS PCT: 1 %
Basophils Absolute: 0.1 10*3/uL (ref 0.0–0.1)
EOS ABS: 0.6 10*3/uL (ref 0.0–0.7)
Eosinophils Relative: 6 %
HEMATOCRIT: 39.2 % (ref 39.0–52.0)
HEMOGLOBIN: 13.4 g/dL (ref 13.0–17.0)
LYMPHS ABS: 1.8 10*3/uL (ref 0.7–4.0)
Lymphocytes Relative: 20 %
MCH: 31.9 pg (ref 26.0–34.0)
MCHC: 34.2 g/dL (ref 30.0–36.0)
MCV: 93.3 fL (ref 78.0–100.0)
MONOS PCT: 9 %
Monocytes Absolute: 0.9 10*3/uL (ref 0.1–1.0)
NEUTROS ABS: 5.7 10*3/uL (ref 1.7–7.7)
NEUTROS PCT: 64 %
Platelets: 212 10*3/uL (ref 150–400)
RBC: 4.2 MIL/uL — AB (ref 4.22–5.81)
RDW: 15.6 % — ABNORMAL HIGH (ref 11.5–15.5)
WBC: 9 10*3/uL (ref 4.0–10.5)

## 2018-07-03 LAB — TROPONIN I: Troponin I: 0.03 ng/mL (ref <0.03)

## 2018-07-03 LAB — HEPATIC FUNCTION PANEL
ALBUMIN: 2.6 g/dL — AB (ref 3.5–5.0)
ALT: 11 U/L (ref 0–44)
AST: 14 U/L — AB (ref 15–41)
Alkaline Phosphatase: 75 U/L (ref 38–126)
Bilirubin, Direct: 0.1 mg/dL (ref 0.0–0.2)
Indirect Bilirubin: 1 mg/dL — ABNORMAL HIGH (ref 0.3–0.9)
Total Bilirubin: 1.1 mg/dL (ref 0.3–1.2)
Total Protein: 7 g/dL (ref 6.5–8.1)

## 2018-07-03 LAB — URINALYSIS, ROUTINE W REFLEX MICROSCOPIC
BILIRUBIN URINE: NEGATIVE
Ketones, ur: NEGATIVE mg/dL
LEUKOCYTES UA: NEGATIVE
NITRITE: NEGATIVE
PH: 5 (ref 5.0–8.0)
Protein, ur: >=300 mg/dL — AB
Specific Gravity, Urine: 1.015 (ref 1.005–1.030)

## 2018-07-03 LAB — BASIC METABOLIC PANEL
Anion gap: 10 (ref 5–15)
BUN: 61 mg/dL — ABNORMAL HIGH (ref 8–23)
CHLORIDE: 105 mmol/L (ref 98–111)
CO2: 21 mmol/L — AB (ref 22–32)
Calcium: 8.8 mg/dL — ABNORMAL LOW (ref 8.9–10.3)
Creatinine, Ser: 1.94 mg/dL — ABNORMAL HIGH (ref 0.61–1.24)
GFR calc non Af Amer: 31 mL/min — ABNORMAL LOW (ref >60)
GFR, EST AFRICAN AMERICAN: 36 mL/min — AB (ref >60)
Glucose, Bld: 259 mg/dL — ABNORMAL HIGH (ref 70–99)
POTASSIUM: 5.3 mmol/L — AB (ref 3.5–5.1)
Sodium: 136 mmol/L (ref 135–145)

## 2018-07-03 LAB — LACTIC ACID, PLASMA
LACTIC ACID, VENOUS: 1.7 mmol/L (ref 0.5–1.9)
Lactic Acid, Venous: 1.6 mmol/L (ref 0.5–1.9)

## 2018-07-03 LAB — BRAIN NATRIURETIC PEPTIDE: B Natriuretic Peptide: 277 pg/mL — ABNORMAL HIGH (ref 0.0–100.0)

## 2018-07-03 MED ORDER — IPRATROPIUM-ALBUTEROL 0.5-2.5 (3) MG/3ML IN SOLN
3.0000 mL | Freq: Once | RESPIRATORY_TRACT | Status: AC
  Administered 2018-07-03: 3 mL via RESPIRATORY_TRACT
  Filled 2018-07-03: qty 3

## 2018-07-03 MED ORDER — LEVOFLOXACIN 500 MG PO TABS: 500.0000 mg | ORAL_TABLET | Freq: Every day | ORAL | 0 refills | Status: AC

## 2018-07-03 MED ORDER — ALBUTEROL SULFATE (2.5 MG/3ML) 0.083% IN NEBU
2.5000 mg | INHALATION_SOLUTION | Freq: Once | RESPIRATORY_TRACT | Status: AC
  Administered 2018-07-03: 2.5 mg via RESPIRATORY_TRACT
  Filled 2018-07-03: qty 3

## 2018-07-03 MED ORDER — ALBUTEROL SULFATE HFA 108 (90 BASE) MCG/ACT IN AERS
2.0000 | INHALATION_SPRAY | Freq: Once | RESPIRATORY_TRACT | Status: AC
  Administered 2018-07-03: 2 via RESPIRATORY_TRACT
  Filled 2018-07-03: qty 6.7

## 2018-07-03 MED ORDER — AZITHROMYCIN 250 MG PO TABS
500.0000 mg | ORAL_TABLET | Freq: Once | ORAL | Status: AC
  Administered 2018-07-03: 500 mg via ORAL
  Filled 2018-07-03: qty 2

## 2018-07-03 MED ORDER — SODIUM CHLORIDE 0.9 % IV SOLN
1.0000 g | Freq: Once | INTRAVENOUS | Status: AC
  Administered 2018-07-03: 1 g via INTRAVENOUS
  Filled 2018-07-03 (×2): qty 10

## 2018-07-03 NOTE — ED Provider Notes (Signed)
[facility] EMERGENCY DEPARTMENT Provider Note   CSN: [id number] Arrival date & time: 07/03/18  1002     History   Chief Complaint Chief Complaint  Patient presents with  . Shortness of Breath    HPI Joshua Fletcher is a 82 y.o. male.  HPI  Joshua Fletcher is a 82 y.o. male with past medical history of COPD, (not on supplemental oxygen) type 2 diabetes, coronary artery disease,  presents to the Emergency Department complaining of cough and worsening shortness of breath.  Symptoms have been present for 1 week.  He states that shortness of breath is associated with exertion.  States he is having difficulty walking from one end of his home to the other without becoming extremely fatigued and short of breath.  He also describes a productive cough of brown sputum.  Reports history of pneumonia.  He denies fever, chills, chest pain, swelling of his hands or ankles.  He takes one '81mg'$  aspirin daily, but no other anticoagulants.   Past Medical History:  Diagnosis Date  . Arthritis   . Constipation   . COPD (chronic obstructive pulmonary disease) ([place])   . Coronary artery disease   . Diabetes mellitus   . Hypertension   . Skin cancer of face     Patient Active Problem List   Diagnosis Date Noted  . Rash due to allergy 10/23/2017  . Infectious systemic inflammatory response syndrome ([place]) 10/18/2017  . HCAP (healthcare-associated pneumonia) 10/17/2017  . S/P Intramedullary implant right femur 08/28/2017  . Acute blood loss anemia   . Acute kidney injury ([place])   . Displaced subtrochanteric fracture of right femur, initial encounter for closed fracture ([place]) 08/22/2017  . COPD (chronic obstructive pulmonary disease) ([place]) 08/22/2017  . Smoker 08/22/2017  . Type 2 diabetes mellitus ([place]) 08/22/2017  . Coronary artery disease 08/22/2017  . Thrombocytopenia ([place]) 08/22/2017  . Hyponatremia 08/22/2017  . Leukocytosis 08/22/2017  . Mass of oropharynx 08/22/2017  . Cervical spondylosis  08/22/2017  . Melanoma of skin ([place]) 02/13/2017  . Uncontrolled type 2 diabetes mellitus with complication, with long-term current use of insulin ([place]) 10/24/2015  . Mixed hyperlipidemia 10/24/2015  . Essential hypertension, benign 10/24/2015    Past Surgical History:  Procedure Laterality Date  . APPENDECTOMY  1958  . BACK SURGERY    . CORONARY STENT PLACEMENT    . FEMUR IM NAIL Right 08/23/2017   Procedure: INTRAMEDULLARY (IM) NAIL FEMORAL;  Surgeon: Nicholes Stairs, MD;  Location: [place];  Service: Orthopedics;  Laterality: Right;  . SKIN CANCER EXCISION  07/2017        Home Medications    Prior to Admission medications   Medication Sig Start Date End Date Taking? Authorizing Provider  acetaminophen (TYLENOL) 325 MG tablet Take 650 mg by mouth every 6 (six) hours as needed.    [provider]  aspirin EC 81 MG tablet Take 81 mg by mouth daily.     [provider]  blood glucose meter kit and supplies KIT Dispense based on patient and insurance preference. Use up to four times daily as directed. (FOR ICD-10 E11.65) 04/08/18   Cassandria Anger, MD  cholecalciferol (VITAMIN D) 1000 units tablet Take 5,000 Units by mouth daily.    [provider]  gabapentin (NEURONTIN) 300 MG capsule Take 1 capsule (300 mg total) by mouth at bedtime. Patient taking differently: Take 300 mg by mouth 3 (three) times daily.  08/26/17   Rosita Fire, MD  glucose blood (ONE TOUCH ULTRA TEST) test strip TEST 3 TIMES DAILY. 04/16/18   Cassandria Anger, MD  insulin degludec (TRESIBA FLEXTOUCH) 100 UNIT/ML SOPN FlexTouch Pen Inject 0.4 mLs (40 Units total) into the skin daily at 10 pm. 06/11/18   Nida, Marella Chimes, MD  Insulin Pen Needle (B-D ULTRAFINE III SHORT PEN) 31G X 8 MM MISC 1 each by Does not apply route as directed. 02/16/18   Cassandria Anger, MD  Melatonin 3 MG TABS Take 3 mg by mouth at bedtime.    [provider]  metoprolol  tartrate (LOPRESSOR) 50 MG tablet Take 50 mg by mouth 2 (two) times daily.     [provider]  Misc Natural Products (OSTEO BI-FLEX ADV TRIPLE ST PO) Take 1 tablet by mouth once a day    [provider]  Multiple Vitamin (MULTIVITAMIN WITH MINERALS) TABS tablet Take 1 tablet by mouth daily.    [provider]  Phenyleph-CPM-DM-Aspirin (ALKA-SELTZER PLUS COLD & COUGH PO) Take 2 tablets by mouth daily as needed (nasal congestion).    [provider]  simvastatin (ZOCOR) 40 MG tablet Take 40 mg by mouth daily.    [provider]  tamsulosin (FLOMAX) 0.4 MG CAPS capsule Take 0.4 mg by mouth.    [provider]  trolamine salicylate (ASPERCREME) 10 % cream Apply 1 application topically as needed for muscle pain.    [provider]    Family History Family History  Problem Relation Age of Onset  . Diabetes Mother     Social History Social History   Tobacco Use  . Smoking status: Former Smoker    Packs/day: 1.00    Years: 25.00    Pack years: 25.00    Types: Cigarettes    Last attempt to quit: 08/03/2017    Years since quitting: 0.9  . Smokeless tobacco: Never Used  Substance Use Topics  . Alcohol use: No  . Drug use: No     Allergies   Patient has no known allergies.   Review of Systems Review of Systems  Constitutional: Positive for fatigue. Negative for appetite change, chills and fever.  HENT: Negative for congestion, sore throat and trouble swallowing.   Respiratory: Positive for cough and shortness of breath. Negative for chest tightness and wheezing.   Cardiovascular: Negative for chest pain and leg swelling.  Gastrointestinal: Negative for abdominal pain, nausea and vomiting.  Genitourinary: Negative for dysuria.  Musculoskeletal: Negative for arthralgias.  Skin: Negative for rash.  Neurological: Negative for dizziness, weakness and numbness.  Hematological: Negative for adenopathy.    Psychiatric/Behavioral: Negative for confusion.  All other systems reviewed and are negative.    Physical Exam Updated Vital Signs BP (!) 150/74 (BP Location: Left Arm)   Pulse 76   Temp 98.6 F (37 C) (Oral)   Resp (!) 23   Ht 6' (1.829 m)   Wt 98.9 kg   SpO2 92%   BMI 29.57 kg/m   Physical Exam  Constitutional: He is oriented to person, place, and time. He appears well-nourished. He does not appear ill.  HENT:  Head: Atraumatic.  Mouth/Throat: Oropharynx is clear and moist.  Neck: Normal range of motion. No JVD present.  Cardiovascular: Normal rate, regular rhythm and intact distal pulses.  No murmur heard. Pulmonary/Chest: Effort normal. He has no wheezes. He has no rales.  Scattered rhonchi bilaterally with crackles, no wheezing. Patient able to speak in full sentences w/o respiratory distress  Abdominal: Soft. He  exhibits no distension. There is no tenderness.  Musculoskeletal: Normal range of motion. He exhibits no edema.  Neurological: He is alert and oriented to person, place, and time. No sensory deficit.  Skin: Skin is warm. Capillary refill takes less than 2 seconds. No rash noted.  Nursing note and vitals reviewed.    ED Treatments / Results  Labs (all labs ordered are listed, but only abnormal results are displayed) Labs Reviewed  CBC WITH DIFFERENTIAL/PLATELET - Abnormal; Notable for the following components:      Result Value   RBC 4.20 (*)    RDW 15.6 (*)    All other components within normal limits  BASIC METABOLIC PANEL - Abnormal; Notable for the following components:   Potassium 5.3 (*)    CO2 21 (*)    Glucose, Bld 259 (*)    BUN 61 (*)    Creatinine, Ser 1.94 (*)    Calcium 8.8 (*)    GFR calc non Af Amer 31 (*)    GFR calc Af Amer 36 (*)    All other components within normal limits  BRAIN NATRIURETIC PEPTIDE - Abnormal; Notable for the following components:   B Natriuretic Peptide 277.0 (*)    All other components within normal limits   TROPONIN I - Abnormal; Notable for the following components:   Troponin I 0.03 (*)    All other components within normal limits  HEPATIC FUNCTION PANEL - Abnormal; Notable for the following components:   Albumin 2.6 (*)    AST 14 (*)    Indirect Bilirubin 1.0 (*)    All other components within normal limits  URINALYSIS, ROUTINE W REFLEX MICROSCOPIC - Abnormal; Notable for the following components:   Glucose, UA >=500 (*)    Hgb urine dipstick MODERATE (*)    Protein, ur >=300 (*)    Bacteria, UA RARE (*)    Non Squamous Epithelial 0-5 (*)    All other components within normal limits  LACTIC ACID, PLASMA  LACTIC ACID, PLASMA    EKG EKG Interpretation  Date/Time:  Friday July 03 2018 10:14:54 EDT Ventricular Rate:  76 PR Interval:    QRS Duration: 100 QT Interval:  377 QTC Calculation: 424 R Axis:   58 Text Interpretation:  Sinus rhythm Borderline repolarization abnormality Baseline wander in lead(s) V5 Confirmed by Nat Christen [phone]) on 07/03/2018 10:29:20 AM   Radiology Dg Chest 2 View  Result Date: 07/03/2018 CLINICAL DATA:  82 year old male with a history of dyspnea on exertion EXAM: CHEST - 2 VIEW COMPARISON:  10/17/2017, CT 12/08/2017 FINDINGS: Cardiomediastinal silhouette unchanged in size and contour. Calcifications of the aortic arch. Low lung volumes. Reticular opacity of the bilateral lungs, with superimposed patchy airspace opacities, worsened interstitial opacities, and blunting of the costophrenic angles. No pneumothorax. IMPRESSION: Worsened interstitial and airspace opacities, compatible with infection and/or edema superimposed on the patient's known interstitial fibrosis. Small pleural effusions not excluded. Electronically Signed   By: Corrie Mckusick D.O.   On: 07/03/2018 11:37    Procedures Procedures (including critical care time)  Medications Ordered in ED Medications  ipratropium-albuterol (DUONEB) 0.5-2.5 (3) MG/3ML nebulizer solution 3 mL (3 mLs  Nebulization Given 07/03/18 1242)  albuterol (PROVENTIL) (2.5 MG/3ML) 0.083% nebulizer solution 2.5 mg (2.5 mg Nebulization Given 07/03/18 1242)  cefTRIAXone (ROCEPHIN) 1 g in sodium chloride 0.9 % 100 mL IVPB (0 g Intravenous Stopped 07/03/18 1310)  azithromycin (ZITHROMAX) tablet 500 mg (500 mg Oral Given 07/03/18 1236)  albuterol (PROVENTIL HFA;VENTOLIN HFA) 108 (  90 Base) MCG/ACT inhaler 2 puff (2 puffs Inhalation Given 07/03/18 1552)     Initial Impression / Assessment and Plan / ED Course  I have reviewed the triage vital signs and the nursing notes.  Pertinent labs & imaging results that were available during my care of the patient were reviewed by me and considered in my medical decision making (see chart for details).      Pt with hx of COPD and PNA.  Former smoker.  Not currently on home oxygen. Exam concerning for recurrent PNA vs CHF  Pt also seen by Dr. Lacinda Axon and care plan discussed.  patient's sats remain in low 90's on 3L O2 [place].  Drops in 80's if placed on 2L.    XR shows bilateral opacities and this is felt to be related to CAP, elevated trop is likely chronic, trop 0.05 in 11/18.  No chest pain.  BNP mildly elevated.  Lactic acid wnml.    I have discussed these findings with the patient and strongly recommended admission due to his continued hypoxia.  He refuses admission.  I have discussed at length the risks involved if he goes home,  including deteriorating condition and even possible death, but he still refuses, family was present for this discussion. Pt verbalized understanding.   Will consult home health to see if home oxygen can be established tonight.  I have spoken with case manager, arrangements are being made with [facility] for discharge with home O2.  Patient agrees to this plan.  Prescription written for Levaquin.  Dispensed albuterol MDI.  patient agrees to ER return if he changes his mind.  Plan also discussed with Dr. Lacinda Axon.    Final Clinical Impressions(s) / ED  Diagnoses   Final diagnoses:  Community acquired pneumonia, bilateral  Hypoxia    ED Discharge Orders    None       Kem Parkinson, PA-C 07/03/18 1746    Nat Christen, MD 07/04/18 1424

## 2018-07-03 NOTE — Discharge Instructions (Addendum)
As discussed, It was my recommendation for you to be admitted.  It is important that you return to the emergency room immediately for any worsening symptoms such as chest pain, increasing shortness of breath, fever, or vomiting.  2 puffs of the albuterol inhaler 4 times a day as needed.  Follow-up with Dr. Luan Pulling in his office on Monday for recheck.

## 2018-07-03 NOTE — ED Notes (Signed)
CRITICAL VALUE ALERT  Critical Value: troponin 0.03  Date & Time Notied: 07/03/2018 @1118   Provider Notified: cook  Orders Received/Actions taken: na

## 2018-07-03 NOTE — ED Notes (Signed)
Pts O2 initially at 69% on room air, placed on 2L, up to 88%

## 2018-07-03 NOTE — ED Notes (Signed)
Maryella Shivers RN SW advised to come see pt for home 02. Tammy PA aware

## 2018-07-03 NOTE — ED Triage Notes (Signed)
Patient complaining of coughing up brown sputum and shortness of breath x 1 week.

## 2018-07-03 NOTE — ED Notes (Signed)
CRITICAL VALUE ALERT  Critical Value:  Troponin 0.03  Date & Time Notied:  07/03/18 1120  Provider Notified: Kem Parkinson, PA  Orders Received/Actions taken: EDP notified, no further orders given

## 2018-07-03 NOTE — Care Management (Signed)
CM received referral for [facility] and home oxygen to get set up. CM in to see pt who is refusing to stay. He has COPD and is desaturating. CM explained medicare will not pay for oxygen out of the ED and his options would be to accept admission/observation, pay OOP or go home accepting the risk. Pt is agreeable to pay OOP until he can get into his PCP next week. Pt has chosen [facility] from list of DME/HH Providers. Pt agreeable to [facility] nursing for f/u. Brad, [facility] rep, given referral. Port device will be delivered to pt ED room prior to DC.

## 2018-07-07 DIAGNOSIS — R69 Illness, unspecified: Secondary | ICD-10-CM | POA: Diagnosis not present

## 2018-07-07 DIAGNOSIS — E782 Mixed hyperlipidemia: Secondary | ICD-10-CM | POA: Diagnosis not present

## 2018-07-07 DIAGNOSIS — J441 Chronic obstructive pulmonary disease with (acute) exacerbation: Secondary | ICD-10-CM | POA: Diagnosis not present

## 2018-07-07 DIAGNOSIS — I10 Essential (primary) hypertension: Secondary | ICD-10-CM | POA: Diagnosis not present

## 2018-07-07 DIAGNOSIS — M1991 Primary osteoarthritis, unspecified site: Secondary | ICD-10-CM | POA: Diagnosis not present

## 2018-07-07 DIAGNOSIS — E119 Type 2 diabetes mellitus without complications: Secondary | ICD-10-CM | POA: Diagnosis not present

## 2018-07-07 DIAGNOSIS — J9611 Chronic respiratory failure with hypoxia: Secondary | ICD-10-CM | POA: Diagnosis not present

## 2018-07-07 DIAGNOSIS — Z85828 Personal history of other malignant neoplasm of skin: Secondary | ICD-10-CM | POA: Diagnosis not present

## 2018-07-07 DIAGNOSIS — Z7982 Long term (current) use of aspirin: Secondary | ICD-10-CM | POA: Diagnosis not present

## 2018-07-07 DIAGNOSIS — J189 Pneumonia, unspecified organism: Secondary | ICD-10-CM | POA: Diagnosis not present

## 2018-07-07 DIAGNOSIS — J449 Chronic obstructive pulmonary disease, unspecified: Secondary | ICD-10-CM | POA: Diagnosis not present

## 2018-07-07 DIAGNOSIS — I2510 Atherosclerotic heart disease of native coronary artery without angina pectoris: Secondary | ICD-10-CM | POA: Diagnosis not present

## 2018-07-07 DIAGNOSIS — K5900 Constipation, unspecified: Secondary | ICD-10-CM | POA: Diagnosis not present

## 2018-07-09 ENCOUNTER — Encounter: Payer: Self-pay | Admitting: "Endocrinology

## 2018-07-09 ENCOUNTER — Ambulatory Visit: Payer: Medicare HMO | Admitting: "Endocrinology

## 2018-07-09 VITALS — BP 123/74 | HR 69

## 2018-07-09 DIAGNOSIS — E119 Type 2 diabetes mellitus without complications: Secondary | ICD-10-CM | POA: Diagnosis not present

## 2018-07-09 DIAGNOSIS — I10 Essential (primary) hypertension: Secondary | ICD-10-CM | POA: Diagnosis not present

## 2018-07-09 DIAGNOSIS — R69 Illness, unspecified: Secondary | ICD-10-CM | POA: Diagnosis not present

## 2018-07-09 DIAGNOSIS — E1165 Type 2 diabetes mellitus with hyperglycemia: Secondary | ICD-10-CM | POA: Diagnosis not present

## 2018-07-09 DIAGNOSIS — E782 Mixed hyperlipidemia: Secondary | ICD-10-CM

## 2018-07-09 DIAGNOSIS — M1991 Primary osteoarthritis, unspecified site: Secondary | ICD-10-CM | POA: Diagnosis not present

## 2018-07-09 DIAGNOSIS — I2510 Atherosclerotic heart disease of native coronary artery without angina pectoris: Secondary | ICD-10-CM | POA: Diagnosis not present

## 2018-07-09 DIAGNOSIS — Z794 Long term (current) use of insulin: Secondary | ICD-10-CM

## 2018-07-09 DIAGNOSIS — E118 Type 2 diabetes mellitus with unspecified complications: Secondary | ICD-10-CM | POA: Diagnosis not present

## 2018-07-09 DIAGNOSIS — K5900 Constipation, unspecified: Secondary | ICD-10-CM | POA: Diagnosis not present

## 2018-07-09 DIAGNOSIS — IMO0002 Reserved for concepts with insufficient information to code with codable children: Secondary | ICD-10-CM

## 2018-07-09 DIAGNOSIS — J449 Chronic obstructive pulmonary disease, unspecified: Secondary | ICD-10-CM | POA: Diagnosis not present

## 2018-07-09 DIAGNOSIS — Z85828 Personal history of other malignant neoplasm of skin: Secondary | ICD-10-CM | POA: Diagnosis not present

## 2018-07-09 DIAGNOSIS — Z7982 Long term (current) use of aspirin: Secondary | ICD-10-CM | POA: Diagnosis not present

## 2018-07-09 MED ORDER — INSULIN DEGLUDEC 100 UNIT/ML ~~LOC~~ SOPN: 50.0000 [IU] | PEN_INJECTOR | Freq: Every day | SUBCUTANEOUS | 2 refills | Status: DC

## 2018-07-09 NOTE — Patient Instructions (Signed)

## 2018-07-09 NOTE — Progress Notes (Signed)
Endocrinology follow-up note   Subjective:    Patient ID: Joshua Fletcher, male    DOB: Apr 04, 1936, PCP Sinda Du, MD   Past Medical History:  Diagnosis Date  . Arthritis   . Constipation   . COPD (chronic obstructive pulmonary disease) ([place])   . Coronary artery disease   . Diabetes mellitus   . Hypertension   . Skin cancer of face    Past Surgical History:  Procedure Laterality Date  . APPENDECTOMY  1958  . BACK SURGERY    . CORONARY STENT PLACEMENT    . FEMUR IM NAIL Right 08/23/2017   Procedure: INTRAMEDULLARY (IM) NAIL FEMORAL;  Surgeon: Nicholes Stairs, MD;  Location: [facility];  Service: Orthopedics;  Laterality: Right;  . SKIN CANCER EXCISION  07/2017   Social History   Socioeconomic History  . Marital status: Married    Spouse name: Not on file  . Number of children: Not on file  . Years of education: Not on file  . Highest education level: Not on file  Occupational History  . Not on file  Social Needs  . Financial resource strain: Not on file  . Food insecurity:    Worry: Not on file    Inability: Not on file  . Transportation needs:    Medical: Not on file    Non-medical: Not on file  Tobacco Use  . Smoking status: Former Smoker    Packs/day: 1.00    Years: 25.00    Pack years: 25.00    Types: Cigarettes    Last attempt to quit: 08/03/2017    Years since quitting: 0.9  . Smokeless tobacco: Never Used  Substance and Sexual Activity  . Alcohol use: No  . Drug use: No  . Sexual activity: Not on file  Lifestyle  . Physical activity:    Days per week: Not on file    Minutes per session: Not on file  . Stress: Not on file  Relationships  . Social connections:    Talks on phone: Not on file    Gets together: Not on file    Attends religious service: Not on file    Active member of club or organization: Not on file    Attends meetings of clubs or organizations: Not on file    Relationship status: Not on file  Other Topics Concern  . Not  on file  Social History Narrative  . Not on file   Outpatient Encounter Medications as of 07/09/2018  Medication Sig  . acetaminophen (TYLENOL) 325 MG tablet Take 650 mg by mouth every 6 (six) hours as needed.  Marland Kitchen aspirin EC 81 MG tablet Take 81 mg by mouth daily.   . blood glucose meter kit and supplies KIT Dispense based on patient and insurance preference. Use up to four times daily as directed. (FOR ICD-10 E11.65)  . cholecalciferol (VITAMIN D) 1000 units tablet Take 5,000 Units by mouth daily.  Marland Kitchen gabapentin (NEURONTIN) 300 MG capsule Take 1 capsule (300 mg total) by mouth at bedtime. (Patient taking differently: Take 300 mg by mouth daily. 2 in the morning and 3 in the evening)  . gabapentin (NEURONTIN) 300 MG capsule Take 2-3 capsules by mouth daily. 2 capsule in the morning and 3 capsule in the evening  . glucose blood (ONE TOUCH ULTRA TEST) test strip TEST 3 TIMES DAILY.  Marland Kitchen insulin degludec (TRESIBA FLEXTOUCH) 100 UNIT/ML SOPN FlexTouch Pen Inject 0.5 mLs (50 Units total) into the skin daily  at 10 pm.  . Insulin Pen Needle (B-D ULTRAFINE III SHORT PEN) 31G X 8 MM MISC 1 each by Does not apply route as directed.  Marland Kitchen levofloxacin (LEVAQUIN) 500 MG tablet Take 1 tablet (500 mg total) by mouth daily.  . Melatonin 3 MG TABS Take 3 mg by mouth at bedtime.  . metoprolol tartrate (LOPRESSOR) 50 MG tablet Take 50 mg by mouth 2 (two) times daily.   . Misc Natural Products (OSTEO BI-FLEX ADV TRIPLE ST PO) Take 1 tablet by mouth once a day  . Multiple Vitamin (MULTIVITAMIN WITH MINERALS) TABS tablet Take 1 tablet by mouth daily.  . simvastatin (ZOCOR) 40 MG tablet Take 40 mg by mouth daily.  . tamsulosin (FLOMAX) 0.4 MG CAPS capsule Take 0.4 mg by mouth.  . trolamine salicylate (ASPERCREME) 10 % cream Apply 1 application topically as needed for muscle pain.  . [DISCONTINUED] insulin degludec (TRESIBA FLEXTOUCH) 100 UNIT/ML SOPN FlexTouch Pen Inject 0.4 mLs (40 Units total) into the skin daily at 10  pm.   No facility-administered encounter medications on file as of 07/09/2018.    ALLERGIES: No Known Allergies VACCINATION STATUS: Immunization History  Administered Date(s) Administered  . Influenza-Unspecified 08/11/2017  . Pneumococcal Conjugate-13 10/06/2017  . Tdap 10/06/2017    Diabetes  He presents for his follow-up diabetic visit. He has type 2 diabetes mellitus. Onset time: He was diagnosed at approximate age of 23 years. His disease course has been worsening. There are no hypoglycemic associated symptoms. Pertinent negatives for hypoglycemia include no confusion, headaches, pallor or seizures. Pertinent negatives for diabetes include no chest pain, no fatigue, no polydipsia, no polyphagia, no polyuria and no weakness. There are no hypoglycemic complications. Symptoms are worsening. Diabetic complications include heart disease, peripheral neuropathy and retinopathy. (He reports complications including coronary artery disease which required stent placement, congestive heart failure.) Risk factors for coronary artery disease include diabetes mellitus, dyslipidemia, hypertension, male sex, sedentary lifestyle and tobacco exposure. Current diabetic treatment includes insulin injections. His weight is stable. He is following a generally unhealthy diet. When asked about meal planning, he reported none. He has not had a previous visit with a dietitian. There is no compliance with monitoring of blood glucose. His overall blood glucose range is >200 mg/dl. (He returns with a meter showing only 3 readings in the last 10 days averaging greater than 200.  He was diagnosed with pneumonia and COPD exacerbation in the interim.  His recent A1c was 9.8% consistent with loss of control of diabetes.   ) An ACE inhibitor/angiotensin II receptor blocker is being taken. Eye exam is current.     Review of Systems  Constitutional: Negative for fatigue and unexpected weight change.  HENT: Negative for dental  problem, mouth sores and trouble swallowing.   Eyes: Negative for visual disturbance.  Respiratory: Negative for cough, choking, chest tightness, shortness of breath and wheezing.   Cardiovascular: Negative for chest pain, palpitations and leg swelling.  Gastrointestinal: Negative for abdominal distention, abdominal pain, constipation, diarrhea, nausea and vomiting.  Endocrine: Negative for polydipsia, polyphagia and polyuria.  Genitourinary: Negative for dysuria, flank pain, hematuria and urgency.  Musculoskeletal: Negative for back pain, gait problem, myalgias and neck pain.       Wheelchair-bound, uses portable oxygen supplement related to his recent diagnosis of pneumonia/COPD.  Skin: Negative for pallor, rash and wound.  Neurological: Negative for seizures, syncope, weakness, numbness and headaches.  Psychiatric/Behavioral: Negative for confusion and dysphoric mood.    Objective:  BP 123/74   Pulse 69   Wt Readings from Last 3 Encounters:  07/03/18 218 lb (98.9 kg)  06/11/18 218 lb (98.9 kg)  02/16/18 201 lb (91.2 kg)    Physical Exam  Constitutional: He is oriented to person, place, and time. He appears well-developed. He is cooperative. No distress.  Walks with a cane.  HENT:  Head: Normocephalic and atraumatic.  Eyes: EOM are normal.  Neck: Normal range of motion. Neck supple. No tracheal deviation present. No thyromegaly present.  Cardiovascular: Normal rate, S1 normal and S2 normal. Exam reveals no gallop.  No murmur heard. Pulses:      Dorsalis pedis pulses are 1+ on the right side, and 1+ on the left side.       Posterior tibial pulses are 1+ on the right side, and 1+ on the left side.  Pulmonary/Chest: No respiratory distress. He has no wheezes.  On portable oxygen supplement, wheelchair-bound.  Not in acute distress.  Abdominal: He exhibits no distension. There is no tenderness. There is no guarding and no CVA tenderness.  Musculoskeletal: He exhibits no edema.        Right shoulder: He exhibits no swelling and no deformity.  Neurological: He is alert and oriented to person, place, and time. He has normal strength and normal reflexes. No cranial nerve deficit or sensory deficit. Gait normal.  Skin: Skin is warm and dry. No rash noted. No cyanosis. Nails show no clubbing.  Psychiatric: He has a normal mood and affect. His speech is normal. Cognition and memory are normal.  He has a better cognitive function today compared to his last visit.    Results for orders placed or performed during the hospital encounter of 07/03/18  CBC with Differential  Result Value Ref Range   WBC 9.0 4.0 - 10.5 K/uL   RBC 4.20 (L) 4.22 - 5.81 MIL/uL   Hemoglobin 13.4 13.0 - 17.0 g/dL   HCT 39.2 39.0 - 52.0 %   MCV 93.3 78.0 - 100.0 fL   MCH 31.9 26.0 - 34.0 pg   MCHC 34.2 30.0 - 36.0 g/dL   RDW 15.6 (H) 11.5 - 15.5 %   Platelets 212 150 - 400 K/uL   Neutrophils Relative % 64 %   Neutro Abs 5.7 1.7 - 7.7 K/uL   Lymphocytes Relative 20 %   Lymphs Abs 1.8 0.7 - 4.0 K/uL   Monocytes Relative 9 %   Monocytes Absolute 0.9 0.1 - 1.0 K/uL   Eosinophils Relative 6 %   Eosinophils Absolute 0.6 0.0 - 0.7 K/uL   Basophils Relative 1 %   Basophils Absolute 0.1 0.0 - 0.1 K/uL  Basic metabolic panel  Result Value Ref Range   Sodium 136 135 - 145 mmol/L   Potassium 5.3 (H) 3.5 - 5.1 mmol/L   Chloride 105 98 - 111 mmol/L   CO2 21 (L) 22 - 32 mmol/L   Glucose, Bld 259 (H) 70 - 99 mg/dL   BUN 61 (H) 8 - 23 mg/dL   Creatinine, Ser 1.94 (H) 0.61 - 1.24 mg/dL   Calcium 8.8 (L) 8.9 - 10.3 mg/dL   GFR calc non Af Amer 31 (L) >60 mL/min   GFR calc Af Amer 36 (L) >60 mL/min   Anion gap 10 5 - 15  Brain natriuretic peptide  Result Value Ref Range   B Natriuretic Peptide 277.0 (H) 0.0 - 100.0 pg/mL  Troponin I  Result Value Ref Range   Troponin I 0.03 (HH) <0.03  ng/mL  Hepatic function panel  Result Value Ref Range   Total Protein 7.0 6.5 - 8.1 g/dL   Albumin 2.6 (L) 3.5 -  5.0 g/dL   AST 14 (L) 15 - 41 U/L   ALT 11 0 - 44 U/L   Alkaline Phosphatase 75 38 - 126 U/L   Total Bilirubin 1.1 0.3 - 1.2 mg/dL   Bilirubin, Direct 0.1 0.0 - 0.2 mg/dL   Indirect Bilirubin 1.0 (H) 0.3 - 0.9 mg/dL  Lactic acid, plasma  Result Value Ref Range   Lactic Acid, Venous 1.6 0.5 - 1.9 mmol/L  Lactic acid, plasma  Result Value Ref Range   Lactic Acid, Venous 1.7 0.5 - 1.9 mmol/L  Urinalysis, Routine w reflex microscopic  Result Value Ref Range   Color, Urine YELLOW YELLOW   APPearance CLEAR CLEAR   Specific Gravity, Urine 1.015 1.005 - 1.030   pH 5.0 5.0 - 8.0   Glucose, UA >=500 (A) NEGATIVE mg/dL   Hgb urine dipstick MODERATE (A) NEGATIVE   Bilirubin Urine NEGATIVE NEGATIVE   Ketones, ur NEGATIVE NEGATIVE mg/dL   Protein, ur >=300 (A) NEGATIVE mg/dL   Nitrite NEGATIVE NEGATIVE   Leukocytes, UA NEGATIVE NEGATIVE   RBC / HPF 11-20 0 - 5 RBC/hpf   WBC, UA 0-5 0 - 5 WBC/hpf   Bacteria, UA RARE (A) NONE SEEN   Granular Casts, UA PRESENT    Non Squamous Epithelial 0-5 (A) NONE SEEN   Complete Blood Count (Most recent): Lab Results  Component Value Date   WBC 9.0 07/03/2018   HGB 13.4 07/03/2018   HCT 39.2 07/03/2018   MCV 93.3 07/03/2018   PLT 212 07/03/2018   Lipid Panel     Component Value Date/Time   CHOL 110 10/09/2017 0400   TRIG 107 10/09/2017 0400   HDL 48 10/09/2017 0400   CHOLHDL 2.3 10/09/2017 0400   VLDL 21 10/09/2017 0400   LDLCALC 41 10/09/2017 0400   Assessment & Plan:   1. Uncontrolled type 2 diabetes mellitus with complication, with long-term current use of insulin ([place])  -His diabetes is  complicated by poor social support, significant physical disability and deconditioning, CAD and CKD and patient remains at extremely  high risk for more acute and chronic complications of diabetes which include CAD, CVA, CKD, retinopathy, and neuropathy. These are all discussed in detail with the patient.   -He presents with inadequate monitoring of  blood glucose while taking insulin.  His recent A1c was 9.8%.   No documented or reported hypoglycemia.    Recent labs reviewed, showing stage 3 renal insufficiency.  - I have re-counseled the patient on diet management  by adopting a carbohydrate restricted / protein rich  Diet.  -He admits to dietary discretion causing weight gain. -  Suggestion is made for him to avoid simple carbohydrates  from his diet including Cakes, Sweet Desserts / Pastries, Ice Cream, Soda (diet and regular), Sweet Tea, Candies, Chips, Cookies, Store Bought Juices, Alcohol in Excess of  1-2 drinks a day, Artificial Sweeteners, and "Sugar-free" Products. This will help patient to have stable blood glucose profile and potentially avoid unintended weight gain.  - Patient is advised to stick to a routine mealtimes to eat 3 meals  a day and avoid unnecessary snacks ( to snack only to correct hypoglycemia).  - I have approached patient with the following individualized plan to manage diabetes and patient agrees.  -Patient is declining fast.   His mealtimes are random and eats  whatever he can get.   #1 goal in treating his diabetes is to avoid hypoglycemia. -Based on his presentation with severe hyperglycemia above target, he will need more insulin.   - He would have been treated with intensive treatment with basal/bolus insulin, if he had enough social support.  He cannot execute this complex insulin program.  -In the meantime, I will proceed to maximize his basal insulin . -I asked him to increase his [place] to 50 units nightly, start monitoring blood glucose at least 2 times a day-daily before breakfast and at bedtime and return in 2 weeks with his meter and logs for evaluation.   -He may need placement in nursing home or group home for skilled care opportunities. -He is advised to call clinic if he registers blood glucose below 70 or above 200 x 3 . - I have advised him to hold NovoLog and metformin for now.    -  Patient specific target  for A1c; LDL, HDL, Triglycerides, and  Waist Circumference were discussed in detail.  2) BP/HTN: His blood pressure is controlled to target.  He is advised to continue his current blood pressure medications including metoprolol 50 mg p.o. twice daily.    3) Lipids/HPL: Controlled lipid panel with LDL of 41.  He is advised to continue simvastatin 40 mg p.o. nightly.    4)  Weight/Diet: Did not weigh today,  cannot exercise optimally recovering from recent hip surgery.  CDE consult in progress, exercise, and carbohydrates information provided.  5) Chronic Care/Health Maintenance:  -Patient is on ACEI/ARB and Statin medications and encouraged to continue to follow up with Ophthalmology, Podiatrist at least yearly or according to recommendations, and he has recently quit smoking. I  have recommended yearly flu vaccine and pneumonia vaccination at least every 5 years;  and  sleep for at least 7 hours a day.  - I advised patient to maintain close follow up with Sinda Du, MD for primary care needs.  - Time spent with the patient: 25 min, of which >50% was spent in reviewing his blood glucose logs , discussing his hypo- and hyper-glycemic episodes, reviewing his current and  previous labs and insulin doses and developing a plan to avoid hypo- and hyper-glycemia. Please refer to Patient Instructions for Blood Glucose Monitoring and Insulin/Medications Dosing Guide"  in media tab for additional information. Obediah L Greiner participated in the discussions, expressed understanding, and voiced agreement with the above plans.  All questions were answered to his satisfaction. he is encouraged to contact clinic should he have any questions or concerns prior to his return visit.  Follow up plan: -Return in about 2 weeks (around 07/23/2018) for Follow up with Meter and Logs Only - no Labs.  Glade Lloyd, MD Phone: [phone]  Fax: [phone]   This note was partially dictated with  voice recognition software. Similar sounding words can be transcribed inadequately or may not  be corrected upon review.  07/09/2018, 5:04 PM

## 2018-07-11 DIAGNOSIS — R69 Illness, unspecified: Secondary | ICD-10-CM | POA: Diagnosis not present

## 2018-07-14 DIAGNOSIS — Z85828 Personal history of other malignant neoplasm of skin: Secondary | ICD-10-CM | POA: Diagnosis not present

## 2018-07-14 DIAGNOSIS — E782 Mixed hyperlipidemia: Secondary | ICD-10-CM | POA: Diagnosis not present

## 2018-07-14 DIAGNOSIS — I2510 Atherosclerotic heart disease of native coronary artery without angina pectoris: Secondary | ICD-10-CM | POA: Diagnosis not present

## 2018-07-14 DIAGNOSIS — R69 Illness, unspecified: Secondary | ICD-10-CM | POA: Diagnosis not present

## 2018-07-14 DIAGNOSIS — K5900 Constipation, unspecified: Secondary | ICD-10-CM | POA: Diagnosis not present

## 2018-07-14 DIAGNOSIS — I10 Essential (primary) hypertension: Secondary | ICD-10-CM | POA: Diagnosis not present

## 2018-07-14 DIAGNOSIS — M1991 Primary osteoarthritis, unspecified site: Secondary | ICD-10-CM | POA: Diagnosis not present

## 2018-07-14 DIAGNOSIS — E119 Type 2 diabetes mellitus without complications: Secondary | ICD-10-CM | POA: Diagnosis not present

## 2018-07-14 DIAGNOSIS — J449 Chronic obstructive pulmonary disease, unspecified: Secondary | ICD-10-CM | POA: Diagnosis not present

## 2018-07-14 DIAGNOSIS — Z7982 Long term (current) use of aspirin: Secondary | ICD-10-CM | POA: Diagnosis not present

## 2018-07-23 ENCOUNTER — Ambulatory Visit (INDEPENDENT_AMBULATORY_CARE_PROVIDER_SITE_OTHER): Payer: Medicare HMO | Admitting: "Endocrinology

## 2018-07-23 ENCOUNTER — Encounter: Payer: Self-pay | Admitting: "Endocrinology

## 2018-07-23 DIAGNOSIS — Z794 Long term (current) use of insulin: Secondary | ICD-10-CM

## 2018-07-23 DIAGNOSIS — E118 Type 2 diabetes mellitus with unspecified complications: Secondary | ICD-10-CM | POA: Diagnosis not present

## 2018-07-23 DIAGNOSIS — IMO0002 Reserved for concepts with insufficient information to code with codable children: Secondary | ICD-10-CM

## 2018-07-23 DIAGNOSIS — E119 Type 2 diabetes mellitus without complications: Secondary | ICD-10-CM | POA: Diagnosis not present

## 2018-07-23 DIAGNOSIS — E782 Mixed hyperlipidemia: Secondary | ICD-10-CM | POA: Diagnosis not present

## 2018-07-23 DIAGNOSIS — I10 Essential (primary) hypertension: Secondary | ICD-10-CM | POA: Diagnosis not present

## 2018-07-23 DIAGNOSIS — H52223 Regular astigmatism, bilateral: Secondary | ICD-10-CM | POA: Diagnosis not present

## 2018-07-23 DIAGNOSIS — E1165 Type 2 diabetes mellitus with hyperglycemia: Secondary | ICD-10-CM

## 2018-07-23 DIAGNOSIS — H5203 Hypermetropia, bilateral: Secondary | ICD-10-CM | POA: Diagnosis not present

## 2018-07-23 DIAGNOSIS — H524 Presbyopia: Secondary | ICD-10-CM | POA: Diagnosis not present

## 2018-07-23 MED ORDER — INSULIN DEGLUDEC 100 UNIT/ML ~~LOC~~ SOPN: 40.0000 [IU] | PEN_INJECTOR | Freq: Every day | SUBCUTANEOUS | 2 refills | Status: DC

## 2018-07-23 NOTE — Patient Instructions (Signed)
                                       Advice for Weight Management  -For most of [place] the best way to lose weight is by diet management. Generally speaking, diet management means consuming less calories intentionally which over time brings about progressive weight loss.  This can be achieved more effectively by restricting carbohydrate consumption to the minimum possible.  More importantly, our carbohydrates sources should be unprocessed or minimally processed complex starch food items.   Sometimes, it is important to balance nutrition by increasing protein intake (animal or plant source), fruits, and vegetables.  -Sticking to a routine mealtime to eat 3 meals a day and avoiding unnecessary snacks is shown to have a big role in weight control.  -It is better to avoid simple carbohydrates including: Cakes, Sweet Desserts, Ice Cream, Soda (diet and regular), Sweet Tea, Candies, Chips, Cookies, Store Bought Juices, Alcohol in Excess of  1-2 drinks a day, Artificial Sweeteners, Doughnuts, Coffee Creamers, "Sugar-free" Products, etc, etc.  This is not a complete list...Marland Kitchen.   -Consulting with certified diabetes educators is proven to provide you with the most accurate and current information on diet.  Also, you may be  interested in discussing diet options/exchanges , we can schedule a visit with Jearld Fenton, RDN, CDE for individualized nutrition education.                                 Additional Care Considerations for Diabetes   -Diabetes he is a chronic disease.  The most important care consideration is regular follow-up with your diabetes care provider with the goal being avoiding or delaying its complications and to take advantage of advances in medications and technology.    -Type 2 diabetes is known to coexist with other important comorbidities such as high blood pressure and high cholesterol.  It is critical to control not only the diabetes but also the  high blood pressure and high cholesterol to minimize and delay the risk of complications including coronary artery disease, stroke, amputations, blindness, etc.    - Studies showed that people with diabetes will benefit from a class of medications known as ACE inhibitors and statins.  Unless there are specific reasons not to be on these medications, the standard of care is to consider getting one from these groups of medications at a minimum dose.  These medications are generally considered safe and proven to help protect the heart and the kidneys.    - People with diabetes are encouraged to initiate and maintain regular follow-up with eye doctors, foot doctors, dentists , and if necessary heart and kidney doctors.     - It is highly recommended that people with diabetes quit smoking or stay away from smoking, and get yearly  flu vaccine and pneumonia vaccine at least every 5 years.  One other important lifestyle recommendation is to ensure adequate sleep - at least 7 hours of uninterrupted sleep at night.

## 2018-07-23 NOTE — Progress Notes (Signed)
Endocrinology follow-up note   Subjective:    Patient ID: Joshua Fletcher, male    DOB: July 28, 1936, PCP Sinda Du, MD   Past Medical History:  Diagnosis Date  . Arthritis   . Constipation   . COPD (chronic obstructive pulmonary disease) ([place])   . Coronary artery disease   . Diabetes mellitus   . Hypertension   . Skin cancer of face    Past Surgical History:  Procedure Laterality Date  . APPENDECTOMY  1958  . BACK SURGERY    . CORONARY STENT PLACEMENT    . FEMUR IM NAIL Right 08/23/2017   Procedure: INTRAMEDULLARY (IM) NAIL FEMORAL;  Surgeon: Nicholes Stairs, MD;  Location: [place];  Service: Orthopedics;  Laterality: Right;  . SKIN CANCER EXCISION  07/2017   Social History   Socioeconomic History  . Marital status: Married    Spouse name: Not on file  . Number of children: Not on file  . Years of education: Not on file  . Highest education level: Not on file  Occupational History  . Not on file  Social Needs  . Financial resource strain: Not on file  . Food insecurity:    Worry: Not on file    Inability: Not on file  . Transportation needs:    Medical: Not on file    Non-medical: Not on file  Tobacco Use  . Smoking status: Former Smoker    Packs/day: 1.00    Years: 25.00    Pack years: 25.00    Types: Cigarettes    Last attempt to quit: 08/03/2017    Years since quitting: 0.9  . Smokeless tobacco: Never Used  Substance and Sexual Activity  . Alcohol use: No  . Drug use: No  . Sexual activity: Not on file  Lifestyle  . Physical activity:    Days per week: Not on file    Minutes per session: Not on file  . Stress: Not on file  Relationships  . Social connections:    Talks on phone: Not on file    Gets together: Not on file    Attends religious service: Not on file    Active member of club or organization: Not on file    Attends meetings of clubs or organizations: Not on file    Relationship status: Not on file  Other Topics Concern  . Not  on file  Social History Narrative  . Not on file   Outpatient Encounter Medications as of 07/23/2018  Medication Sig  . acetaminophen (TYLENOL) 325 MG tablet Take 650 mg by mouth every 6 (six) hours as needed.  Marland Kitchen aspirin EC 81 MG tablet Take 81 mg by mouth daily.   . blood glucose meter kit and supplies KIT Dispense based on patient and insurance preference. Use up to four times daily as directed. (FOR ICD-10 E11.65)  . cholecalciferol (VITAMIN D) 1000 units tablet Take 5,000 Units by mouth daily.  Marland Kitchen gabapentin (NEURONTIN) 300 MG capsule Take 1 capsule (300 mg total) by mouth at bedtime. (Patient taking differently: Take 300 mg by mouth daily. 2 in the morning and 3 in the evening)  . gabapentin (NEURONTIN) 300 MG capsule Take 2-3 capsules by mouth daily. 2 capsule in the morning and 3 capsule in the evening  . glucose blood (ONE TOUCH ULTRA TEST) test strip TEST 3 TIMES DAILY.  Marland Kitchen insulin degludec (TRESIBA FLEXTOUCH) 100 UNIT/ML SOPN FlexTouch Pen Inject 0.4 mLs (40 Units total) into the skin daily  at 10 pm.  . Insulin Pen Needle (B-D ULTRAFINE III SHORT PEN) 31G X 8 MM MISC 1 each by Does not apply route as directed.  Marland Kitchen levofloxacin (LEVAQUIN) 500 MG tablet Take 1 tablet (500 mg total) by mouth daily.  . Melatonin 3 MG TABS Take 3 mg by mouth at bedtime.  . metoprolol tartrate (LOPRESSOR) 50 MG tablet Take 50 mg by mouth 2 (two) times daily.   . Misc Natural Products (OSTEO BI-FLEX ADV TRIPLE ST PO) Take 1 tablet by mouth once a day  . Multiple Vitamin (MULTIVITAMIN WITH MINERALS) TABS tablet Take 1 tablet by mouth daily.  . simvastatin (ZOCOR) 40 MG tablet Take 40 mg by mouth daily.  . tamsulosin (FLOMAX) 0.4 MG CAPS capsule Take 0.4 mg by mouth.  . trolamine salicylate (ASPERCREME) 10 % cream Apply 1 application topically as needed for muscle pain.  . [DISCONTINUED] insulin degludec (TRESIBA FLEXTOUCH) 100 UNIT/ML SOPN FlexTouch Pen Inject 0.5 mLs (50 Units total) into the skin daily at 10  pm.   No facility-administered encounter medications on file as of 07/23/2018.    ALLERGIES: No Known Allergies VACCINATION STATUS: Immunization History  Administered Date(s) Administered  . Influenza-Unspecified 08/11/2017  . Pneumococcal Conjugate-13 10/06/2017  . Tdap 10/06/2017    Diabetes  He presents for his follow-up diabetic visit. He has type 2 diabetes mellitus. Onset time: He was diagnosed at approximate age of 26 years. His disease course has been improving. There are no hypoglycemic associated symptoms. Pertinent negatives for hypoglycemia include no confusion, headaches, pallor or seizures. Pertinent negatives for diabetes include no chest pain, no fatigue, no polydipsia, no polyphagia, no polyuria and no weakness. There are no hypoglycemic complications. Symptoms are improving. Diabetic complications include heart disease, peripheral neuropathy and retinopathy. (He reports complications including coronary artery disease which required stent placement, congestive heart failure.) Risk factors for coronary artery disease include diabetes mellitus, dyslipidemia, hypertension, male sex, sedentary lifestyle and tobacco exposure. Current diabetic treatment includes insulin injections. His weight is stable. He is following a generally unhealthy diet. When asked about meal planning, he reported none. He has not had a previous visit with a dietitian. There is no compliance with monitoring of blood glucose. His home blood glucose trend is fluctuating minimally. His breakfast blood glucose range is generally 180-200 mg/dl. His overall blood glucose range is 180-200 mg/dl. (He returns with a meter showing 0-2 monitoring daily, average 187.    His recent A1c was 9.8% consistent with loss of control of diabetes.   ) An ACE inhibitor/angiotensin II receptor blocker is being taken. Eye exam is current.     Review of Systems  Constitutional: Negative for fatigue and unexpected weight change.   HENT: Negative for dental problem, mouth sores and trouble swallowing.   Eyes: Negative for visual disturbance.  Respiratory: Negative for cough, choking, chest tightness, shortness of breath and wheezing.   Cardiovascular: Negative for chest pain, palpitations and leg swelling.  Gastrointestinal: Negative for abdominal distention, abdominal pain, constipation, diarrhea, nausea and vomiting.  Endocrine: Negative for polydipsia, polyphagia and polyuria.  Genitourinary: Negative for dysuria, flank pain, hematuria and urgency.  Musculoskeletal: Negative for back pain, gait problem, myalgias and neck pain.       Wheelchair-bound, uses portable oxygen supplement related to his recent diagnosis of pneumonia/COPD.  Skin: Negative for pallor, rash and wound.  Neurological: Negative for seizures, syncope, weakness, numbness and headaches.  Psychiatric/Behavioral: Negative for confusion and dysphoric mood.    Objective:  There were no vitals taken for this visit.  Wt Readings from Last 3 Encounters:  07/03/18 218 lb (98.9 kg)  06/11/18 218 lb (98.9 kg)  02/16/18 201 lb (91.2 kg)    Physical Exam  Constitutional: He is oriented to person, place, and time. He appears well-developed. He is cooperative. No distress.  Walks with a cane.  HENT:  Head: Normocephalic and atraumatic.  Eyes: EOM are normal.  Neck: Normal range of motion. Neck supple. No tracheal deviation present. No thyromegaly present.  Cardiovascular: Normal rate, S1 normal and S2 normal. Exam reveals no gallop.  No murmur heard. Pulses:      Dorsalis pedis pulses are 1+ on the right side, and 1+ on the left side.       Posterior tibial pulses are 1+ on the right side, and 1+ on the left side.  Pulmonary/Chest: No respiratory distress. He has no wheezes.  On portable oxygen supplement, wheelchair-bound.  Not in acute distress.  Abdominal: He exhibits no distension. There is no tenderness. There is no guarding and no CVA  tenderness.  Musculoskeletal: He exhibits no edema.       Right shoulder: He exhibits no swelling and no deformity.  Walking with a cane.  Neurological: He is alert and oriented to person, place, and time. He has normal strength. No cranial nerve deficit or sensory deficit. Gait normal.  He has significant disequilibrium.  Skin: Skin is warm and dry. No rash noted. No cyanosis. Nails show no clubbing.  Psychiatric: He has a normal mood and affect. His speech is normal. Cognition and memory are normal.  He has a better cognitive function today compared to his last visit.    Results for orders placed or performed during the hospital encounter of 07/03/18  CBC with Differential  Result Value Ref Range   WBC 9.0 4.0 - 10.5 K/uL   RBC 4.20 (L) 4.22 - 5.81 MIL/uL   Hemoglobin 13.4 13.0 - 17.0 g/dL   HCT 39.2 39.0 - 52.0 %   MCV 93.3 78.0 - 100.0 fL   MCH 31.9 26.0 - 34.0 pg   MCHC 34.2 30.0 - 36.0 g/dL   RDW 15.6 (H) 11.5 - 15.5 %   Platelets 212 150 - 400 K/uL   Neutrophils Relative % 64 %   Neutro Abs 5.7 1.7 - 7.7 K/uL   Lymphocytes Relative 20 %   Lymphs Abs 1.8 0.7 - 4.0 K/uL   Monocytes Relative 9 %   Monocytes Absolute 0.9 0.1 - 1.0 K/uL   Eosinophils Relative 6 %   Eosinophils Absolute 0.6 0.0 - 0.7 K/uL   Basophils Relative 1 %   Basophils Absolute 0.1 0.0 - 0.1 K/uL  Basic metabolic panel  Result Value Ref Range   Sodium 136 135 - 145 mmol/L   Potassium 5.3 (H) 3.5 - 5.1 mmol/L   Chloride 105 98 - 111 mmol/L   CO2 21 (L) 22 - 32 mmol/L   Glucose, Bld 259 (H) 70 - 99 mg/dL   BUN 61 (H) 8 - 23 mg/dL   Creatinine, Ser 1.94 (H) 0.61 - 1.24 mg/dL   Calcium 8.8 (L) 8.9 - 10.3 mg/dL   GFR calc non Af Amer 31 (L) >60 mL/min   GFR calc Af Amer 36 (L) >60 mL/min   Anion gap 10 5 - 15  Brain natriuretic peptide  Result Value Ref Range   B Natriuretic Peptide 277.0 (H) 0.0 - 100.0 pg/mL  Troponin I  Result Value Ref  Range   Troponin I 0.03 (HH) <0.03 ng/mL  Hepatic  function panel  Result Value Ref Range   Total Protein 7.0 6.5 - 8.1 g/dL   Albumin 2.6 (L) 3.5 - 5.0 g/dL   AST 14 (L) 15 - 41 U/L   ALT 11 0 - 44 U/L   Alkaline Phosphatase 75 38 - 126 U/L   Total Bilirubin 1.1 0.3 - 1.2 mg/dL   Bilirubin, Direct 0.1 0.0 - 0.2 mg/dL   Indirect Bilirubin 1.0 (H) 0.3 - 0.9 mg/dL  Lactic acid, plasma  Result Value Ref Range   Lactic Acid, Venous 1.6 0.5 - 1.9 mmol/L  Lactic acid, plasma  Result Value Ref Range   Lactic Acid, Venous 1.7 0.5 - 1.9 mmol/L  Urinalysis, Routine w reflex microscopic  Result Value Ref Range   Color, Urine YELLOW YELLOW   APPearance CLEAR CLEAR   Specific Gravity, Urine 1.015 1.005 - 1.030   pH 5.0 5.0 - 8.0   Glucose, UA >=500 (A) NEGATIVE mg/dL   Hgb urine dipstick MODERATE (A) NEGATIVE   Bilirubin Urine NEGATIVE NEGATIVE   Ketones, ur NEGATIVE NEGATIVE mg/dL   Protein, ur >=300 (A) NEGATIVE mg/dL   Nitrite NEGATIVE NEGATIVE   Leukocytes, UA NEGATIVE NEGATIVE   RBC / HPF 11-20 0 - 5 RBC/hpf   WBC, UA 0-5 0 - 5 WBC/hpf   Bacteria, UA RARE (A) NONE SEEN   Granular Casts, UA PRESENT    Non Squamous Epithelial 0-5 (A) NONE SEEN   Complete Blood Count (Most recent): Lab Results  Component Value Date   WBC 9.0 07/03/2018   HGB 13.4 07/03/2018   HCT 39.2 07/03/2018   MCV 93.3 07/03/2018   PLT 212 07/03/2018   Lipid Panel     Component Value Date/Time   CHOL 110 10/09/2017 0400   TRIG 107 10/09/2017 0400   HDL 48 10/09/2017 0400   CHOLHDL 2.3 10/09/2017 0400   VLDL 21 10/09/2017 0400   LDLCALC 41 10/09/2017 0400   Assessment & Plan:   1. Uncontrolled type 2 diabetes mellitus with complication, with long-term current use of insulin ([place])  -His diabetes is  complicated by poor social support, significant physical disability and deconditioning, CAD and CKD and patient remains at extremely  high risk for more acute and chronic complications of diabetes which include CAD, CVA, CKD, retinopathy, and neuropathy.  These are all discussed in detail with the patient.   -He presents with inadequate monitoring of blood glucose while taking insulin.  His recent A1c was 9.8%.   No documented or reported hypoglycemia.    Recent labs reviewed, showing stage 3 renal insufficiency.  - I have re-counseled the patient on diet management  by adopting a carbohydrate restricted / protein rich  Diet.  -He admits to dietary discretion causing weight gain. -  Suggestion is made for him to avoid simple carbohydrates  from his diet including Cakes, Sweet Desserts / Pastries, Ice Cream, Soda (diet and regular), Sweet Tea, Candies, Chips, Cookies, Store Bought Juices, Alcohol in Excess of  1-2 drinks a day, Artificial Sweeteners, and "Sugar-free" Products. This will help patient to have stable blood glucose profile and potentially avoid unintended weight gain.   - Patient is advised to stick to a routine mealtimes to eat 3 meals  a day and avoid unnecessary snacks ( to snack only to correct hypoglycemia).  - I have approached patient with the following individualized plan to manage diabetes and patient agrees.  -Patient is declining  fast.   His mealtimes are random and eats whatever he can get.   #1 goal in treating his diabetes is to avoid hypoglycemia. -Based on his presentation with twitching glycemic profile, he will continue to need insulin treatment, however difficult to give him optimal doses.   .   - He would have been treated with intensive treatment with basal/bolus insulin, if he had enough social support.  He cannot execute this complex insulin program.  -In the meantime, I will proceed to decrease his Tresiba to 40  units nightly, start monitoring blood glucose at least 2 times a day-daily before breakfast and at bedtime and return in 2 weeks with his meter and logs for evaluation.   -He may need placement in nursing home or group home for skilled care opportunities. -He is advised to call clinic if he  registers blood glucose below 70 or above 200 x 3 . - I have advised him to hold NovoLog and metformin for now.    - Patient specific target  for A1c; LDL, HDL, Triglycerides, and  Waist Circumference were discussed in detail.  2) BP/HTN: His blood pressure is controlled to target.  He is advised to continue his current blood pressure medications including metoprolol 50 mg p.o. twice daily.    3) Lipids/HPL: Controlled lipid panel with LDL of 41.  He is advised to continue simvastatin 40 mg p.o. nightly.    4)  Weight/Diet: Did not weigh today,  cannot exercise optimally recovering from recent hip surgery.  CDE consult in progress, exercise, and carbohydrates information provided.  5) Chronic Care/Health Maintenance:  -Patient is on ACEI/ARB and Statin medications and encouraged to continue to follow up with Ophthalmology, Podiatrist at least yearly or according to recommendations, and he has recently quit smoking. I  have recommended yearly flu vaccine and pneumonia vaccination at least every 5 years;  and  sleep for at least 7 hours a day.  - I advised patient to maintain close follow up with Sinda Du, MD for primary care needs.  - Time spent with the patient: 25 min, of which >50% was spent in reviewing his blood glucose logs , discussing his hypo- and hyper-glycemic episodes, reviewing his current and  previous labs and insulin doses and developing a plan to avoid hypo- and hyper-glycemia. Please refer to Patient Instructions for Blood Glucose Monitoring and Insulin/Medications Dosing Guide"  in media tab for additional information. Rahsaan L Ciliberto participated in the discussions, expressed understanding, and voiced agreement with the above plans.  All questions were answered to his satisfaction. he is encouraged to contact clinic should he have any questions or concerns prior to his return visit.  Follow up plan: -Return in about 3 months (around 10/23/2018) for Follow up with  Pre-visit Labs, Meter, and Logs.  Glade Lloyd, MD Phone: [phone]  Fax: [phone]   This note was partially dictated with voice recognition software. Similar sounding words can be transcribed inadequately or may not  be corrected upon review.  07/23/2018, 5:03 PM

## 2018-07-24 DIAGNOSIS — I2510 Atherosclerotic heart disease of native coronary artery without angina pectoris: Secondary | ICD-10-CM | POA: Diagnosis not present

## 2018-07-24 DIAGNOSIS — I10 Essential (primary) hypertension: Secondary | ICD-10-CM | POA: Diagnosis not present

## 2018-07-24 DIAGNOSIS — J189 Pneumonia, unspecified organism: Secondary | ICD-10-CM | POA: Diagnosis not present

## 2018-07-24 DIAGNOSIS — J441 Chronic obstructive pulmonary disease with (acute) exacerbation: Secondary | ICD-10-CM | POA: Diagnosis not present

## 2018-07-28 DIAGNOSIS — Z7982 Long term (current) use of aspirin: Secondary | ICD-10-CM | POA: Diagnosis not present

## 2018-07-28 DIAGNOSIS — E119 Type 2 diabetes mellitus without complications: Secondary | ICD-10-CM | POA: Diagnosis not present

## 2018-07-28 DIAGNOSIS — I2510 Atherosclerotic heart disease of native coronary artery without angina pectoris: Secondary | ICD-10-CM | POA: Diagnosis not present

## 2018-07-28 DIAGNOSIS — E782 Mixed hyperlipidemia: Secondary | ICD-10-CM | POA: Diagnosis not present

## 2018-07-28 DIAGNOSIS — R69 Illness, unspecified: Secondary | ICD-10-CM | POA: Diagnosis not present

## 2018-07-28 DIAGNOSIS — K5900 Constipation, unspecified: Secondary | ICD-10-CM | POA: Diagnosis not present

## 2018-07-28 DIAGNOSIS — M1991 Primary osteoarthritis, unspecified site: Secondary | ICD-10-CM | POA: Diagnosis not present

## 2018-07-28 DIAGNOSIS — I10 Essential (primary) hypertension: Secondary | ICD-10-CM | POA: Diagnosis not present

## 2018-07-28 DIAGNOSIS — Z85828 Personal history of other malignant neoplasm of skin: Secondary | ICD-10-CM | POA: Diagnosis not present

## 2018-07-28 DIAGNOSIS — J449 Chronic obstructive pulmonary disease, unspecified: Secondary | ICD-10-CM | POA: Diagnosis not present

## 2018-08-03 DIAGNOSIS — K5900 Constipation, unspecified: Secondary | ICD-10-CM | POA: Diagnosis not present

## 2018-08-03 DIAGNOSIS — M1991 Primary osteoarthritis, unspecified site: Secondary | ICD-10-CM | POA: Diagnosis not present

## 2018-08-03 DIAGNOSIS — Z7982 Long term (current) use of aspirin: Secondary | ICD-10-CM | POA: Diagnosis not present

## 2018-08-03 DIAGNOSIS — S72141D Displaced intertrochanteric fracture of right femur, subsequent encounter for closed fracture with routine healing: Secondary | ICD-10-CM | POA: Diagnosis not present

## 2018-08-03 DIAGNOSIS — I10 Essential (primary) hypertension: Secondary | ICD-10-CM | POA: Diagnosis not present

## 2018-08-03 DIAGNOSIS — E782 Mixed hyperlipidemia: Secondary | ICD-10-CM | POA: Diagnosis not present

## 2018-08-03 DIAGNOSIS — J189 Pneumonia, unspecified organism: Secondary | ICD-10-CM | POA: Diagnosis not present

## 2018-08-03 DIAGNOSIS — I2510 Atherosclerotic heart disease of native coronary artery without angina pectoris: Secondary | ICD-10-CM | POA: Diagnosis not present

## 2018-08-03 DIAGNOSIS — J449 Chronic obstructive pulmonary disease, unspecified: Secondary | ICD-10-CM | POA: Diagnosis not present

## 2018-08-03 DIAGNOSIS — R69 Illness, unspecified: Secondary | ICD-10-CM | POA: Diagnosis not present

## 2018-08-03 DIAGNOSIS — Z85828 Personal history of other malignant neoplasm of skin: Secondary | ICD-10-CM | POA: Diagnosis not present

## 2018-08-03 DIAGNOSIS — E119 Type 2 diabetes mellitus without complications: Secondary | ICD-10-CM | POA: Diagnosis not present

## 2018-08-05 DIAGNOSIS — E1151 Type 2 diabetes mellitus with diabetic peripheral angiopathy without gangrene: Secondary | ICD-10-CM | POA: Diagnosis not present

## 2018-08-05 DIAGNOSIS — E1140 Type 2 diabetes mellitus with diabetic neuropathy, unspecified: Secondary | ICD-10-CM | POA: Diagnosis not present

## 2018-08-10 DIAGNOSIS — J449 Chronic obstructive pulmonary disease, unspecified: Secondary | ICD-10-CM | POA: Diagnosis not present

## 2018-08-10 DIAGNOSIS — M1991 Primary osteoarthritis, unspecified site: Secondary | ICD-10-CM | POA: Diagnosis not present

## 2018-08-10 DIAGNOSIS — I2510 Atherosclerotic heart disease of native coronary artery without angina pectoris: Secondary | ICD-10-CM | POA: Diagnosis not present

## 2018-08-10 DIAGNOSIS — E119 Type 2 diabetes mellitus without complications: Secondary | ICD-10-CM | POA: Diagnosis not present

## 2018-08-10 DIAGNOSIS — I10 Essential (primary) hypertension: Secondary | ICD-10-CM | POA: Diagnosis not present

## 2018-08-10 DIAGNOSIS — K5900 Constipation, unspecified: Secondary | ICD-10-CM | POA: Diagnosis not present

## 2018-08-10 DIAGNOSIS — Z7982 Long term (current) use of aspirin: Secondary | ICD-10-CM | POA: Diagnosis not present

## 2018-08-10 DIAGNOSIS — E782 Mixed hyperlipidemia: Secondary | ICD-10-CM | POA: Diagnosis not present

## 2018-08-10 DIAGNOSIS — R69 Illness, unspecified: Secondary | ICD-10-CM | POA: Diagnosis not present

## 2018-08-10 DIAGNOSIS — Z85828 Personal history of other malignant neoplasm of skin: Secondary | ICD-10-CM | POA: Diagnosis not present

## 2018-08-12 DIAGNOSIS — M1991 Primary osteoarthritis, unspecified site: Secondary | ICD-10-CM | POA: Diagnosis not present

## 2018-08-12 DIAGNOSIS — K5900 Constipation, unspecified: Secondary | ICD-10-CM | POA: Diagnosis not present

## 2018-08-12 DIAGNOSIS — I2510 Atherosclerotic heart disease of native coronary artery without angina pectoris: Secondary | ICD-10-CM | POA: Diagnosis not present

## 2018-08-12 DIAGNOSIS — R69 Illness, unspecified: Secondary | ICD-10-CM | POA: Diagnosis not present

## 2018-08-12 DIAGNOSIS — J449 Chronic obstructive pulmonary disease, unspecified: Secondary | ICD-10-CM | POA: Diagnosis not present

## 2018-08-12 DIAGNOSIS — Z85828 Personal history of other malignant neoplasm of skin: Secondary | ICD-10-CM | POA: Diagnosis not present

## 2018-08-12 DIAGNOSIS — I10 Essential (primary) hypertension: Secondary | ICD-10-CM | POA: Diagnosis not present

## 2018-08-12 DIAGNOSIS — E119 Type 2 diabetes mellitus without complications: Secondary | ICD-10-CM | POA: Diagnosis not present

## 2018-08-12 DIAGNOSIS — Z7982 Long term (current) use of aspirin: Secondary | ICD-10-CM | POA: Diagnosis not present

## 2018-08-12 DIAGNOSIS — E782 Mixed hyperlipidemia: Secondary | ICD-10-CM | POA: Diagnosis not present

## 2018-08-28 DIAGNOSIS — K5900 Constipation, unspecified: Secondary | ICD-10-CM | POA: Diagnosis not present

## 2018-08-28 DIAGNOSIS — J449 Chronic obstructive pulmonary disease, unspecified: Secondary | ICD-10-CM | POA: Diagnosis not present

## 2018-08-28 DIAGNOSIS — I10 Essential (primary) hypertension: Secondary | ICD-10-CM | POA: Diagnosis not present

## 2018-08-28 DIAGNOSIS — R69 Illness, unspecified: Secondary | ICD-10-CM | POA: Diagnosis not present

## 2018-08-28 DIAGNOSIS — Z85828 Personal history of other malignant neoplasm of skin: Secondary | ICD-10-CM | POA: Diagnosis not present

## 2018-08-28 DIAGNOSIS — Z7982 Long term (current) use of aspirin: Secondary | ICD-10-CM | POA: Diagnosis not present

## 2018-08-28 DIAGNOSIS — I2510 Atherosclerotic heart disease of native coronary artery without angina pectoris: Secondary | ICD-10-CM | POA: Diagnosis not present

## 2018-08-28 DIAGNOSIS — M1991 Primary osteoarthritis, unspecified site: Secondary | ICD-10-CM | POA: Diagnosis not present

## 2018-08-28 DIAGNOSIS — E782 Mixed hyperlipidemia: Secondary | ICD-10-CM | POA: Diagnosis not present

## 2018-08-28 DIAGNOSIS — E119 Type 2 diabetes mellitus without complications: Secondary | ICD-10-CM | POA: Diagnosis not present

## 2018-09-02 DIAGNOSIS — J189 Pneumonia, unspecified organism: Secondary | ICD-10-CM | POA: Diagnosis not present

## 2018-09-02 DIAGNOSIS — S72141D Displaced intertrochanteric fracture of right femur, subsequent encounter for closed fracture with routine healing: Secondary | ICD-10-CM | POA: Diagnosis not present

## 2018-09-02 DIAGNOSIS — J449 Chronic obstructive pulmonary disease, unspecified: Secondary | ICD-10-CM | POA: Diagnosis not present

## 2018-09-15 DIAGNOSIS — Z125 Encounter for screening for malignant neoplasm of prostate: Secondary | ICD-10-CM | POA: Diagnosis not present

## 2018-09-15 DIAGNOSIS — E785 Hyperlipidemia, unspecified: Secondary | ICD-10-CM | POA: Diagnosis not present

## 2018-09-15 DIAGNOSIS — M545 Low back pain: Secondary | ICD-10-CM | POA: Diagnosis not present

## 2018-09-15 DIAGNOSIS — J449 Chronic obstructive pulmonary disease, unspecified: Secondary | ICD-10-CM | POA: Diagnosis not present

## 2018-09-15 DIAGNOSIS — I2510 Atherosclerotic heart disease of native coronary artery without angina pectoris: Secondary | ICD-10-CM | POA: Diagnosis not present

## 2018-09-15 DIAGNOSIS — Z0000 Encounter for general adult medical examination without abnormal findings: Secondary | ICD-10-CM | POA: Diagnosis not present

## 2018-09-15 DIAGNOSIS — I10 Essential (primary) hypertension: Secondary | ICD-10-CM | POA: Diagnosis not present

## 2018-09-15 DIAGNOSIS — M1990 Unspecified osteoarthritis, unspecified site: Secondary | ICD-10-CM | POA: Diagnosis not present

## 2018-09-15 DIAGNOSIS — E119 Type 2 diabetes mellitus without complications: Secondary | ICD-10-CM | POA: Diagnosis not present

## 2018-09-15 LAB — LIPID PANEL
Cholesterol: 103 (ref 0–200)
HDL: 37 (ref 35–70)
LDL CALC: 40
Triglycerides: 180 — AB (ref 40–160)

## 2018-09-15 LAB — PSA: PSA: 0.5

## 2018-09-15 LAB — HEMOGLOBIN A1C: Hgb A1c MFr Bld: 9.4 — AB (ref 4.0–6.0)

## 2018-09-15 LAB — BASIC METABOLIC PANEL
BUN: 34 — AB (ref 4–21)
Creatinine: 1.9 — AB (ref <=1.3)

## 2018-09-25 ENCOUNTER — Other Ambulatory Visit: Payer: Self-pay | Admitting: "Endocrinology

## 2018-10-03 DIAGNOSIS — S72141D Displaced intertrochanteric fracture of right femur, subsequent encounter for closed fracture with routine healing: Secondary | ICD-10-CM | POA: Diagnosis not present

## 2018-10-03 DIAGNOSIS — J449 Chronic obstructive pulmonary disease, unspecified: Secondary | ICD-10-CM | POA: Diagnosis not present

## 2018-10-03 DIAGNOSIS — J189 Pneumonia, unspecified organism: Secondary | ICD-10-CM | POA: Diagnosis not present

## 2018-10-14 DIAGNOSIS — Z23 Encounter for immunization: Secondary | ICD-10-CM | POA: Diagnosis not present

## 2018-10-18 DIAGNOSIS — G588 Other specified mononeuropathies: Secondary | ICD-10-CM | POA: Diagnosis not present

## 2018-10-18 DIAGNOSIS — G589 Mononeuropathy, unspecified: Secondary | ICD-10-CM | POA: Diagnosis not present

## 2018-10-18 DIAGNOSIS — M545 Low back pain: Secondary | ICD-10-CM | POA: Diagnosis not present

## 2018-10-18 DIAGNOSIS — G4730 Sleep apnea, unspecified: Secondary | ICD-10-CM | POA: Diagnosis not present

## 2018-10-18 DIAGNOSIS — I10 Essential (primary) hypertension: Secondary | ICD-10-CM | POA: Diagnosis not present

## 2018-10-18 DIAGNOSIS — Z794 Long term (current) use of insulin: Secondary | ICD-10-CM | POA: Diagnosis not present

## 2018-10-18 DIAGNOSIS — I2510 Atherosclerotic heart disease of native coronary artery without angina pectoris: Secondary | ICD-10-CM | POA: Diagnosis not present

## 2018-10-18 DIAGNOSIS — E785 Hyperlipidemia, unspecified: Secondary | ICD-10-CM | POA: Diagnosis not present

## 2018-10-18 DIAGNOSIS — M1990 Unspecified osteoarthritis, unspecified site: Secondary | ICD-10-CM | POA: Diagnosis not present

## 2018-10-18 DIAGNOSIS — J449 Chronic obstructive pulmonary disease, unspecified: Secondary | ICD-10-CM | POA: Diagnosis not present

## 2018-10-18 DIAGNOSIS — E119 Type 2 diabetes mellitus without complications: Secondary | ICD-10-CM | POA: Diagnosis not present

## 2018-10-20 DIAGNOSIS — G4730 Sleep apnea, unspecified: Secondary | ICD-10-CM | POA: Diagnosis not present

## 2018-10-20 DIAGNOSIS — I2510 Atherosclerotic heart disease of native coronary artery without angina pectoris: Secondary | ICD-10-CM | POA: Diagnosis not present

## 2018-10-20 DIAGNOSIS — M1990 Unspecified osteoarthritis, unspecified site: Secondary | ICD-10-CM | POA: Diagnosis not present

## 2018-10-20 DIAGNOSIS — G589 Mononeuropathy, unspecified: Secondary | ICD-10-CM | POA: Diagnosis not present

## 2018-10-20 DIAGNOSIS — I10 Essential (primary) hypertension: Secondary | ICD-10-CM | POA: Diagnosis not present

## 2018-10-20 DIAGNOSIS — J449 Chronic obstructive pulmonary disease, unspecified: Secondary | ICD-10-CM | POA: Diagnosis not present

## 2018-10-20 DIAGNOSIS — E119 Type 2 diabetes mellitus without complications: Secondary | ICD-10-CM | POA: Diagnosis not present

## 2018-10-20 DIAGNOSIS — M545 Low back pain: Secondary | ICD-10-CM | POA: Diagnosis not present

## 2018-10-20 DIAGNOSIS — Z794 Long term (current) use of insulin: Secondary | ICD-10-CM | POA: Diagnosis not present

## 2018-10-20 DIAGNOSIS — E785 Hyperlipidemia, unspecified: Secondary | ICD-10-CM | POA: Diagnosis not present

## 2018-10-21 DIAGNOSIS — G4730 Sleep apnea, unspecified: Secondary | ICD-10-CM | POA: Diagnosis not present

## 2018-10-21 DIAGNOSIS — I2510 Atherosclerotic heart disease of native coronary artery without angina pectoris: Secondary | ICD-10-CM | POA: Diagnosis not present

## 2018-10-21 DIAGNOSIS — Z794 Long term (current) use of insulin: Secondary | ICD-10-CM | POA: Diagnosis not present

## 2018-10-21 DIAGNOSIS — M1990 Unspecified osteoarthritis, unspecified site: Secondary | ICD-10-CM | POA: Diagnosis not present

## 2018-10-21 DIAGNOSIS — M545 Low back pain: Secondary | ICD-10-CM | POA: Diagnosis not present

## 2018-10-21 DIAGNOSIS — J449 Chronic obstructive pulmonary disease, unspecified: Secondary | ICD-10-CM | POA: Diagnosis not present

## 2018-10-21 DIAGNOSIS — E785 Hyperlipidemia, unspecified: Secondary | ICD-10-CM | POA: Diagnosis not present

## 2018-10-21 DIAGNOSIS — I10 Essential (primary) hypertension: Secondary | ICD-10-CM | POA: Diagnosis not present

## 2018-10-21 DIAGNOSIS — G589 Mononeuropathy, unspecified: Secondary | ICD-10-CM | POA: Diagnosis not present

## 2018-10-21 DIAGNOSIS — E119 Type 2 diabetes mellitus without complications: Secondary | ICD-10-CM | POA: Diagnosis not present

## 2018-10-23 DIAGNOSIS — M545 Low back pain: Secondary | ICD-10-CM | POA: Diagnosis not present

## 2018-10-23 DIAGNOSIS — Z794 Long term (current) use of insulin: Secondary | ICD-10-CM | POA: Diagnosis not present

## 2018-10-23 DIAGNOSIS — I10 Essential (primary) hypertension: Secondary | ICD-10-CM | POA: Diagnosis not present

## 2018-10-23 DIAGNOSIS — E785 Hyperlipidemia, unspecified: Secondary | ICD-10-CM | POA: Diagnosis not present

## 2018-10-23 DIAGNOSIS — I2510 Atherosclerotic heart disease of native coronary artery without angina pectoris: Secondary | ICD-10-CM | POA: Diagnosis not present

## 2018-10-23 DIAGNOSIS — E119 Type 2 diabetes mellitus without complications: Secondary | ICD-10-CM | POA: Diagnosis not present

## 2018-10-23 DIAGNOSIS — J449 Chronic obstructive pulmonary disease, unspecified: Secondary | ICD-10-CM | POA: Diagnosis not present

## 2018-10-23 DIAGNOSIS — G4730 Sleep apnea, unspecified: Secondary | ICD-10-CM | POA: Diagnosis not present

## 2018-10-23 DIAGNOSIS — M1990 Unspecified osteoarthritis, unspecified site: Secondary | ICD-10-CM | POA: Diagnosis not present

## 2018-10-23 DIAGNOSIS — G589 Mononeuropathy, unspecified: Secondary | ICD-10-CM | POA: Diagnosis not present

## 2018-10-26 DIAGNOSIS — J449 Chronic obstructive pulmonary disease, unspecified: Secondary | ICD-10-CM | POA: Diagnosis not present

## 2018-10-26 DIAGNOSIS — E119 Type 2 diabetes mellitus without complications: Secondary | ICD-10-CM | POA: Diagnosis not present

## 2018-10-26 DIAGNOSIS — Z794 Long term (current) use of insulin: Secondary | ICD-10-CM | POA: Diagnosis not present

## 2018-10-26 DIAGNOSIS — M545 Low back pain: Secondary | ICD-10-CM | POA: Diagnosis not present

## 2018-10-26 DIAGNOSIS — I10 Essential (primary) hypertension: Secondary | ICD-10-CM | POA: Diagnosis not present

## 2018-10-26 DIAGNOSIS — M1990 Unspecified osteoarthritis, unspecified site: Secondary | ICD-10-CM | POA: Diagnosis not present

## 2018-10-26 DIAGNOSIS — E785 Hyperlipidemia, unspecified: Secondary | ICD-10-CM | POA: Diagnosis not present

## 2018-10-26 DIAGNOSIS — G589 Mononeuropathy, unspecified: Secondary | ICD-10-CM | POA: Diagnosis not present

## 2018-10-26 DIAGNOSIS — I2510 Atherosclerotic heart disease of native coronary artery without angina pectoris: Secondary | ICD-10-CM | POA: Diagnosis not present

## 2018-10-26 DIAGNOSIS — G4730 Sleep apnea, unspecified: Secondary | ICD-10-CM | POA: Diagnosis not present

## 2018-10-27 ENCOUNTER — Ambulatory Visit: Payer: Medicare HMO | Admitting: "Endocrinology

## 2018-10-27 ENCOUNTER — Encounter: Payer: Self-pay | Admitting: "Endocrinology

## 2018-10-27 VITALS — BP 150/71 | HR 90 | Ht 72.0 in

## 2018-10-27 DIAGNOSIS — E118 Type 2 diabetes mellitus with unspecified complications: Secondary | ICD-10-CM

## 2018-10-27 DIAGNOSIS — E782 Mixed hyperlipidemia: Secondary | ICD-10-CM | POA: Diagnosis not present

## 2018-10-27 DIAGNOSIS — Z794 Long term (current) use of insulin: Secondary | ICD-10-CM

## 2018-10-27 DIAGNOSIS — IMO0002 Reserved for concepts with insufficient information to code with codable children: Secondary | ICD-10-CM

## 2018-10-27 DIAGNOSIS — I10 Essential (primary) hypertension: Secondary | ICD-10-CM | POA: Diagnosis not present

## 2018-10-27 DIAGNOSIS — E1165 Type 2 diabetes mellitus with hyperglycemia: Secondary | ICD-10-CM | POA: Diagnosis not present

## 2018-10-27 MED ORDER — GLIPIZIDE ER 5 MG PO TB24: 5.0000 mg | ORAL_TABLET | Freq: Every day | ORAL | 6 refills | Status: AC

## 2018-10-27 MED ORDER — INSULIN DEGLUDEC 100 UNIT/ML ~~LOC~~ SOPN: 50.0000 [IU] | PEN_INJECTOR | Freq: Every day | SUBCUTANEOUS | 2 refills | Status: DC

## 2018-10-27 NOTE — Progress Notes (Signed)
Endocrinology follow-up note   Subjective:    Patient ID: Joshua Fletcher, male    DOB: 28-Dec-1935, PCP Sinda Du, MD   Past Medical History:  Diagnosis Date  . Arthritis   . Constipation   . COPD (chronic obstructive pulmonary disease) ([place])   . Coronary artery disease   . Diabetes mellitus   . Hypertension   . Skin cancer of face    Past Surgical History:  Procedure Laterality Date  . APPENDECTOMY  1958  . BACK SURGERY    . CORONARY STENT PLACEMENT    . FEMUR IM NAIL Right 08/23/2017   Procedure: INTRAMEDULLARY (IM) NAIL FEMORAL;  Surgeon: Nicholes Stairs, MD;  Location: [facility];  Service: Orthopedics;  Laterality: Right;  . SKIN CANCER EXCISION  07/2017   Social History   Socioeconomic History  . Marital status: Married    Spouse name: Not on file  . Number of children: Not on file  . Years of education: Not on file  . Highest education level: Not on file  Occupational History  . Not on file  Social Needs  . Financial resource strain: Not on file  . Food insecurity:    Worry: Not on file    Inability: Not on file  . Transportation needs:    Medical: Not on file    Non-medical: Not on file  Tobacco Use  . Smoking status: Former Smoker    Packs/day: 1.00    Years: 25.00    Pack years: 25.00    Types: Cigarettes    Last attempt to quit: 08/03/2017    Years since quitting: 1.2  . Smokeless tobacco: Never Used  Substance and Sexual Activity  . Alcohol use: No  . Drug use: No  . Sexual activity: Not on file  Lifestyle  . Physical activity:    Days per week: Not on file    Minutes per session: Not on file  . Stress: Not on file  Relationships  . Social connections:    Talks on phone: Not on file    Gets together: Not on file    Attends religious service: Not on file    Active member of club or organization: Not on file    Attends meetings of clubs or organizations: Not on file    Relationship status: Not on file  Other Topics Concern  . Not  on file  Social History Narrative  . Not on file   Outpatient Encounter Medications as of 10/27/2018  Medication Sig  . acetaminophen (TYLENOL) 325 MG tablet Take 650 mg by mouth every 6 (six) hours as needed.  Marland Kitchen aspirin EC 81 MG tablet Take 81 mg by mouth daily.   . blood glucose meter kit and supplies KIT Dispense based on patient and insurance preference. Use up to four times daily as directed. (FOR ICD-10 E11.65)  . cholecalciferol (VITAMIN D) 1000 units tablet Take 5,000 Units by mouth daily.  Marland Kitchen gabapentin (NEURONTIN) 300 MG capsule Take 1 capsule (300 mg total) by mouth at bedtime. (Patient taking differently: Take 300 mg by mouth daily. 2 in the morning and 3 in the evening)  . gabapentin (NEURONTIN) 300 MG capsule Take 2-3 capsules by mouth daily. 2 capsule in the morning and 3 capsule in the evening  . glipiZIDE (GLUCOTROL XL) 5 MG 24 hr tablet Take 1 tablet (5 mg total) by mouth daily with breakfast.  . glucose blood (ONE TOUCH ULTRA TEST) test strip TEST 3 TIMES DAILY.  Marland Kitchen  insulin degludec (TRESIBA FLEXTOUCH) 100 UNIT/ML SOPN FlexTouch Pen Inject 0.5 mLs (50 Units total) into the skin at bedtime.  . Insulin Pen Needle (B-D ULTRAFINE III SHORT PEN) 31G X 8 MM MISC 1 each by Does not apply route as directed.  Marland Kitchen levofloxacin (LEVAQUIN) 500 MG tablet Take 1 tablet (500 mg total) by mouth daily.  . Melatonin 3 MG TABS Take 3 mg by mouth at bedtime.  . metoprolol tartrate (LOPRESSOR) 50 MG tablet Take 50 mg by mouth 2 (two) times daily.   . Misc Natural Products (OSTEO BI-FLEX ADV TRIPLE ST PO) Take 1 tablet by mouth once a day  . Multiple Vitamin (MULTIVITAMIN WITH MINERALS) TABS tablet Take 1 tablet by mouth daily.  . simvastatin (ZOCOR) 40 MG tablet Take 40 mg by mouth daily.  . tamsulosin (FLOMAX) 0.4 MG CAPS capsule Take 0.4 mg by mouth.  . trolamine salicylate (ASPERCREME) 10 % cream Apply 1 application topically as needed for muscle pain.  . [DISCONTINUED] insulin degludec (TRESIBA  FLEXTOUCH) 100 UNIT/ML SOPN FlexTouch Pen Inject 0.4 mLs (40 Units total) into the skin daily at 10 pm.  . [DISCONTINUED] insulin degludec (TRESIBA FLEXTOUCH) 100 UNIT/ML SOPN FlexTouch Pen Inject 0.4 mLs (40 Units total) into the skin at bedtime.   No facility-administered encounter medications on file as of 10/27/2018.    ALLERGIES: No Known Allergies VACCINATION STATUS: Immunization History  Administered Date(s) Administered  . Influenza-Unspecified 08/11/2017  . Pneumococcal Conjugate-13 10/06/2017  . Tdap 10/06/2017    Diabetes  He presents for his follow-up diabetic visit. He has type 2 diabetes mellitus. Onset time: He was diagnosed at approximate age of 59 years. His disease course has been improving. There are no hypoglycemic associated symptoms. Pertinent negatives for hypoglycemia include no confusion, headaches, pallor or seizures. Pertinent negatives for diabetes include no chest pain, no fatigue, no polydipsia, no polyphagia, no polyuria and no weakness. There are no hypoglycemic complications. Symptoms are improving. Diabetic complications include heart disease, peripheral neuropathy and retinopathy. (He reports complications including coronary artery disease which required stent placement, congestive heart failure.) Risk factors for coronary artery disease include diabetes mellitus, dyslipidemia, hypertension, male sex, sedentary lifestyle and tobacco exposure. Current diabetic treatment includes insulin injections. His weight is stable. He is following a generally unhealthy diet. When asked about meal planning, he reported none. He has not had a previous visit with a dietitian. There is no compliance with monitoring of blood glucose. His home blood glucose trend is fluctuating minimally. His breakfast blood glucose range is generally 180-200 mg/dl. His overall blood glucose range is 180-200 mg/dl. (He returns with a meter showing 0-2 monitoring daily, average 187.    His recent A1c  was 9.8% consistent with loss of control of diabetes.   ) An ACE inhibitor/angiotensin II receptor blocker is being taken. Eye exam is current.     Review of Systems  Constitutional: Negative for fatigue and unexpected weight change.  HENT: Negative for dental problem, mouth sores and trouble swallowing.   Eyes: Negative for visual disturbance.  Respiratory: Negative for cough, choking, chest tightness, shortness of breath and wheezing.   Cardiovascular: Negative for chest pain, palpitations and leg swelling.  Gastrointestinal: Negative for abdominal distention, abdominal pain, constipation, diarrhea, nausea and vomiting.  Endocrine: Negative for polydipsia, polyphagia and polyuria.  Genitourinary: Negative for dysuria, flank pain, hematuria and urgency.  Musculoskeletal: Negative for back pain, gait problem, myalgias and neck pain.       Wheelchair-bound, uses portable oxygen supplement  related to his recent diagnosis of pneumonia/COPD.  Skin: Negative for pallor, rash and wound.  Neurological: Negative for seizures, syncope, weakness, numbness and headaches.  Psychiatric/Behavioral: Negative for confusion and dysphoric mood.    Objective:    BP (!) 150/71   Pulse 90   Wt Readings from Last 3 Encounters:  07/03/18 218 lb (98.9 kg)  06/11/18 218 lb (98.9 kg)  02/16/18 201 lb (91.2 kg)    Physical Exam  Constitutional: He is oriented to person, place, and time. He appears well-developed. He is cooperative. No distress.  Walks with a cane.  HENT:  Head: Normocephalic and atraumatic.  Eyes: EOM are normal.  Neck: Normal range of motion. Neck supple. No tracheal deviation present. No thyromegaly present.  Cardiovascular: Normal rate, S1 normal and S2 normal. Exam reveals no gallop.  No murmur heard. Pulses:      Dorsalis pedis pulses are 1+ on the right side, and 1+ on the left side.       Posterior tibial pulses are 1+ on the right side, and 1+ on the left side.   Pulmonary/Chest: No respiratory distress. He has no wheezes.  On portable oxygen supplement, wheelchair-bound.  Not in acute distress.  Abdominal: He exhibits no distension. There is no tenderness. There is no guarding and no CVA tenderness.  Musculoskeletal: He exhibits no edema.       Right shoulder: He exhibits no swelling and no deformity.  Walking with a cane.  Neurological: He is alert and oriented to person, place, and time. He has normal strength. No cranial nerve deficit or sensory deficit. Gait normal.  He has significant disequilibrium.  Skin: Skin is warm and dry. No rash noted. No cyanosis. Nails show no clubbing.  Psychiatric: He has a normal mood and affect. His speech is normal. Cognition and memory are normal.  He has a better cognitive function today compared to his last visit.    Results for orders placed or performed in visit on 66/06/30  Basic metabolic panel  Result Value Ref Range   BUN 34 (A) 4 - 21   Creatinine 1.9 (A) 0.6 - 1.3  Lipid panel  Result Value Ref Range   Triglycerides 180 (A) 40 - 160   Cholesterol 103 0 - 200   HDL 37 35 - 70   LDL Cholesterol 40   Hemoglobin A1c  Result Value Ref Range   Hgb A1c MFr Bld 9.4 (A) 4.0 - 6.0  PSA  Result Value Ref Range   PSA 0.5    Complete Blood Count (Most recent): Lab Results  Component Value Date   WBC 9.0 07/03/2018   HGB 13.4 07/03/2018   HCT 39.2 07/03/2018   MCV 93.3 07/03/2018   PLT 212 07/03/2018   Lipid Panel     Component Value Date/Time   CHOL 103 09/15/2018   TRIG 180 (A) 09/15/2018   HDL 37 09/15/2018   CHOLHDL 2.3 10/09/2017 0400   VLDL 21 10/09/2017 0400   LDLCALC 40 09/15/2018   Assessment & Plan:   1. Uncontrolled type 2 diabetes mellitus with complication, with long-term current use of insulin ([place])  -His diabetes is  complicated by poor social support, significant physical disability and deconditioning, CAD and CKD and patient remains at extremely  high risk for more  acute and chronic complications of diabetes which include CAD, CVA, CKD, retinopathy, and neuropathy. These are all discussed in detail with the patient.   -He presents with inadequate monitoring of blood glucose  while taking insulin.  His recent A1c is still above target at 9.4%.    He monitors his blood glucose randomly.  No documented or reported hypoglycemia.    Recent labs reviewed, showing stage 3 renal insufficiency.  - I have re-counseled the patient on diet management  by adopting a carbohydrate restricted / protein rich  Diet.  -  Suggestion is made for him to avoid simple carbohydrates  from his diet including Cakes, Sweet Desserts / Pastries, Ice Cream, Soda (diet and regular), Sweet Tea, Candies, Chips, Cookies, Store Bought Juices, Alcohol in Excess of  1-2 drinks a day, Artificial Sweeteners, and "Sugar-free" Products. This will help patient to have stable blood glucose profile and potentially avoid unintended weight gain.   - Patient is advised to stick to a routine mealtimes to eat 3 meals  a day and avoid unnecessary snacks ( to snack only to correct hypoglycemia).  - I have approached patient with the following individualized plan to manage diabetes and patient agrees.  -   His mealtimes are random and eats whatever he can get.   #1 goal in treating his diabetes is to avoid hypoglycemia. -Based on his presentation with significantly above target glycemic profile he will be considered for slight increase in his basal insulin.    -He is advised to increase Tresiba to 50 units nightly,start monitoring blood glucose at least 2 times a day-daily before breakfast and at bedtime. -He cannot execute a plan for multiple daily injections of insulin. -I discussed and added Glucotrol XL 5 mg p.o. daily with breakfast.  -He may need placement in nursing home or group home for skilled care opportunities. -He is advised to call clinic if he registers blood glucose below 70 or above 200 x  3 .  - Patient specific target  for A1c; LDL, HDL, Triglycerides, and  Waist Circumference were discussed in detail.  2) BP/HTN: His blood pressure is not controlled to target.  He is advised to continue his current blood pressure medications including metoprolol 50 mg p.o. twice daily.    3) Lipids/HPL: Controlled lipid panel with LDL of 41.  He is advised to continue simvastatin 40 mg p.o. nightly.      4)  Weight/Diet: Did not weigh today,  cannot exercise optimally recovering from recent hip surgery.  CDE consult in progress, exercise, and carbohydrates information provided.  5) Chronic Care/Health Maintenance:  -Patient is on ACEI/ARB and Statin medications and encouraged to continue to follow up with Ophthalmology, Podiatrist at least yearly or according to recommendations, and he has recently quit smoking. I  have recommended yearly flu vaccine and pneumonia vaccination at least every 5 years;  and  sleep for at least 7 hours a day.  - I advised patient to maintain close follow up with Sinda Du, MD for primary care needs.  - Time spent with the patient: 25 min, of which >50% was spent in reviewing his blood glucose logs , discussing his hypo- and hyper-glycemic episodes, reviewing his current and  previous labs and insulin doses and developing a plan to avoid hypo- and hyper-glycemia. Please refer to Patient Instructions for Blood Glucose Monitoring and Insulin/Medications Dosing Guide"  in media tab for additional information. Huntington L Janicki participated in the discussions, expressed understanding, and voiced agreement with the above plans.  All questions were answered to his satisfaction. he is encouraged to contact clinic should he have any questions or concerns prior to his return visit.  Follow up plan: -Return  in about 4 months (around 02/26/2019) for Follow up with Pre-visit Labs, Meter, and Logs.  Glade Lloyd, MD Phone: [phone]  Fax: [phone]   This note was  partially dictated with voice recognition software. Similar sounding words can be transcribed inadequately or may not  be corrected upon review.  10/27/2018, 3:26 PM

## 2018-10-28 DIAGNOSIS — E119 Type 2 diabetes mellitus without complications: Secondary | ICD-10-CM | POA: Diagnosis not present

## 2018-10-28 DIAGNOSIS — Z794 Long term (current) use of insulin: Secondary | ICD-10-CM | POA: Diagnosis not present

## 2018-10-28 DIAGNOSIS — G4730 Sleep apnea, unspecified: Secondary | ICD-10-CM | POA: Diagnosis not present

## 2018-10-28 DIAGNOSIS — G589 Mononeuropathy, unspecified: Secondary | ICD-10-CM | POA: Diagnosis not present

## 2018-10-28 DIAGNOSIS — M545 Low back pain: Secondary | ICD-10-CM | POA: Diagnosis not present

## 2018-10-28 DIAGNOSIS — J449 Chronic obstructive pulmonary disease, unspecified: Secondary | ICD-10-CM | POA: Diagnosis not present

## 2018-10-28 DIAGNOSIS — E785 Hyperlipidemia, unspecified: Secondary | ICD-10-CM | POA: Diagnosis not present

## 2018-10-28 DIAGNOSIS — I2510 Atherosclerotic heart disease of native coronary artery without angina pectoris: Secondary | ICD-10-CM | POA: Diagnosis not present

## 2018-10-28 DIAGNOSIS — I10 Essential (primary) hypertension: Secondary | ICD-10-CM | POA: Diagnosis not present

## 2018-10-28 DIAGNOSIS — M1990 Unspecified osteoarthritis, unspecified site: Secondary | ICD-10-CM | POA: Diagnosis not present

## 2018-10-29 DIAGNOSIS — E1151 Type 2 diabetes mellitus with diabetic peripheral angiopathy without gangrene: Secondary | ICD-10-CM | POA: Diagnosis not present

## 2018-10-29 DIAGNOSIS — E1140 Type 2 diabetes mellitus with diabetic neuropathy, unspecified: Secondary | ICD-10-CM | POA: Diagnosis not present

## 2018-10-30 DIAGNOSIS — G4730 Sleep apnea, unspecified: Secondary | ICD-10-CM | POA: Diagnosis not present

## 2018-10-30 DIAGNOSIS — E119 Type 2 diabetes mellitus without complications: Secondary | ICD-10-CM | POA: Diagnosis not present

## 2018-10-30 DIAGNOSIS — E785 Hyperlipidemia, unspecified: Secondary | ICD-10-CM | POA: Diagnosis not present

## 2018-10-30 DIAGNOSIS — M1990 Unspecified osteoarthritis, unspecified site: Secondary | ICD-10-CM | POA: Diagnosis not present

## 2018-10-30 DIAGNOSIS — G589 Mononeuropathy, unspecified: Secondary | ICD-10-CM | POA: Diagnosis not present

## 2018-10-30 DIAGNOSIS — Z794 Long term (current) use of insulin: Secondary | ICD-10-CM | POA: Diagnosis not present

## 2018-10-30 DIAGNOSIS — I2510 Atherosclerotic heart disease of native coronary artery without angina pectoris: Secondary | ICD-10-CM | POA: Diagnosis not present

## 2018-10-30 DIAGNOSIS — M545 Low back pain: Secondary | ICD-10-CM | POA: Diagnosis not present

## 2018-10-30 DIAGNOSIS — J449 Chronic obstructive pulmonary disease, unspecified: Secondary | ICD-10-CM | POA: Diagnosis not present

## 2018-10-30 DIAGNOSIS — I10 Essential (primary) hypertension: Secondary | ICD-10-CM | POA: Diagnosis not present

## 2018-11-02 DIAGNOSIS — S72141D Displaced intertrochanteric fracture of right femur, subsequent encounter for closed fracture with routine healing: Secondary | ICD-10-CM | POA: Diagnosis not present

## 2018-11-02 DIAGNOSIS — I10 Essential (primary) hypertension: Secondary | ICD-10-CM | POA: Diagnosis not present

## 2018-11-02 DIAGNOSIS — Z794 Long term (current) use of insulin: Secondary | ICD-10-CM | POA: Diagnosis not present

## 2018-11-02 DIAGNOSIS — E119 Type 2 diabetes mellitus without complications: Secondary | ICD-10-CM | POA: Diagnosis not present

## 2018-11-02 DIAGNOSIS — M1990 Unspecified osteoarthritis, unspecified site: Secondary | ICD-10-CM | POA: Diagnosis not present

## 2018-11-02 DIAGNOSIS — I2510 Atherosclerotic heart disease of native coronary artery without angina pectoris: Secondary | ICD-10-CM | POA: Diagnosis not present

## 2018-11-02 DIAGNOSIS — J189 Pneumonia, unspecified organism: Secondary | ICD-10-CM | POA: Diagnosis not present

## 2018-11-02 DIAGNOSIS — G589 Mononeuropathy, unspecified: Secondary | ICD-10-CM | POA: Diagnosis not present

## 2018-11-02 DIAGNOSIS — J449 Chronic obstructive pulmonary disease, unspecified: Secondary | ICD-10-CM | POA: Diagnosis not present

## 2018-11-02 DIAGNOSIS — E785 Hyperlipidemia, unspecified: Secondary | ICD-10-CM | POA: Diagnosis not present

## 2018-11-02 DIAGNOSIS — M545 Low back pain: Secondary | ICD-10-CM | POA: Diagnosis not present

## 2018-11-02 DIAGNOSIS — G4730 Sleep apnea, unspecified: Secondary | ICD-10-CM | POA: Diagnosis not present

## 2018-11-03 DIAGNOSIS — E785 Hyperlipidemia, unspecified: Secondary | ICD-10-CM | POA: Diagnosis not present

## 2018-11-03 DIAGNOSIS — J449 Chronic obstructive pulmonary disease, unspecified: Secondary | ICD-10-CM | POA: Diagnosis not present

## 2018-11-03 DIAGNOSIS — G589 Mononeuropathy, unspecified: Secondary | ICD-10-CM | POA: Diagnosis not present

## 2018-11-03 DIAGNOSIS — M1990 Unspecified osteoarthritis, unspecified site: Secondary | ICD-10-CM | POA: Diagnosis not present

## 2018-11-03 DIAGNOSIS — I2510 Atherosclerotic heart disease of native coronary artery without angina pectoris: Secondary | ICD-10-CM | POA: Diagnosis not present

## 2018-11-03 DIAGNOSIS — M545 Low back pain: Secondary | ICD-10-CM | POA: Diagnosis not present

## 2018-11-03 DIAGNOSIS — I10 Essential (primary) hypertension: Secondary | ICD-10-CM | POA: Diagnosis not present

## 2018-11-03 DIAGNOSIS — G4730 Sleep apnea, unspecified: Secondary | ICD-10-CM | POA: Diagnosis not present

## 2018-11-03 DIAGNOSIS — Z794 Long term (current) use of insulin: Secondary | ICD-10-CM | POA: Diagnosis not present

## 2018-11-03 DIAGNOSIS — E119 Type 2 diabetes mellitus without complications: Secondary | ICD-10-CM | POA: Diagnosis not present

## 2018-11-04 DIAGNOSIS — E119 Type 2 diabetes mellitus without complications: Secondary | ICD-10-CM | POA: Diagnosis not present

## 2018-11-04 DIAGNOSIS — J449 Chronic obstructive pulmonary disease, unspecified: Secondary | ICD-10-CM | POA: Diagnosis not present

## 2018-11-04 DIAGNOSIS — M1990 Unspecified osteoarthritis, unspecified site: Secondary | ICD-10-CM | POA: Diagnosis not present

## 2018-11-04 DIAGNOSIS — I10 Essential (primary) hypertension: Secondary | ICD-10-CM | POA: Diagnosis not present

## 2018-11-04 DIAGNOSIS — M545 Low back pain: Secondary | ICD-10-CM | POA: Diagnosis not present

## 2018-11-04 DIAGNOSIS — I2510 Atherosclerotic heart disease of native coronary artery without angina pectoris: Secondary | ICD-10-CM | POA: Diagnosis not present

## 2018-11-04 DIAGNOSIS — Z794 Long term (current) use of insulin: Secondary | ICD-10-CM | POA: Diagnosis not present

## 2018-11-04 DIAGNOSIS — G4730 Sleep apnea, unspecified: Secondary | ICD-10-CM | POA: Diagnosis not present

## 2018-11-04 DIAGNOSIS — G589 Mononeuropathy, unspecified: Secondary | ICD-10-CM | POA: Diagnosis not present

## 2018-11-04 DIAGNOSIS — E785 Hyperlipidemia, unspecified: Secondary | ICD-10-CM | POA: Diagnosis not present

## 2018-11-06 DIAGNOSIS — M545 Low back pain: Secondary | ICD-10-CM | POA: Diagnosis not present

## 2018-11-06 DIAGNOSIS — J449 Chronic obstructive pulmonary disease, unspecified: Secondary | ICD-10-CM | POA: Diagnosis not present

## 2018-11-06 DIAGNOSIS — Z794 Long term (current) use of insulin: Secondary | ICD-10-CM | POA: Diagnosis not present

## 2018-11-06 DIAGNOSIS — G4730 Sleep apnea, unspecified: Secondary | ICD-10-CM | POA: Diagnosis not present

## 2018-11-06 DIAGNOSIS — E785 Hyperlipidemia, unspecified: Secondary | ICD-10-CM | POA: Diagnosis not present

## 2018-11-06 DIAGNOSIS — E119 Type 2 diabetes mellitus without complications: Secondary | ICD-10-CM | POA: Diagnosis not present

## 2018-11-06 DIAGNOSIS — I10 Essential (primary) hypertension: Secondary | ICD-10-CM | POA: Diagnosis not present

## 2018-11-06 DIAGNOSIS — G589 Mononeuropathy, unspecified: Secondary | ICD-10-CM | POA: Diagnosis not present

## 2018-11-06 DIAGNOSIS — M1990 Unspecified osteoarthritis, unspecified site: Secondary | ICD-10-CM | POA: Diagnosis not present

## 2018-11-06 DIAGNOSIS — I2510 Atherosclerotic heart disease of native coronary artery without angina pectoris: Secondary | ICD-10-CM | POA: Diagnosis not present

## 2018-11-09 DIAGNOSIS — M1990 Unspecified osteoarthritis, unspecified site: Secondary | ICD-10-CM | POA: Diagnosis not present

## 2018-11-09 DIAGNOSIS — E119 Type 2 diabetes mellitus without complications: Secondary | ICD-10-CM | POA: Diagnosis not present

## 2018-11-09 DIAGNOSIS — I10 Essential (primary) hypertension: Secondary | ICD-10-CM | POA: Diagnosis not present

## 2018-11-09 DIAGNOSIS — G4730 Sleep apnea, unspecified: Secondary | ICD-10-CM | POA: Diagnosis not present

## 2018-11-09 DIAGNOSIS — J449 Chronic obstructive pulmonary disease, unspecified: Secondary | ICD-10-CM | POA: Diagnosis not present

## 2018-11-09 DIAGNOSIS — I2510 Atherosclerotic heart disease of native coronary artery without angina pectoris: Secondary | ICD-10-CM | POA: Diagnosis not present

## 2018-11-09 DIAGNOSIS — Z794 Long term (current) use of insulin: Secondary | ICD-10-CM | POA: Diagnosis not present

## 2018-11-09 DIAGNOSIS — E785 Hyperlipidemia, unspecified: Secondary | ICD-10-CM | POA: Diagnosis not present

## 2018-11-09 DIAGNOSIS — M545 Low back pain: Secondary | ICD-10-CM | POA: Diagnosis not present

## 2018-11-09 DIAGNOSIS — G589 Mononeuropathy, unspecified: Secondary | ICD-10-CM | POA: Diagnosis not present

## 2018-11-11 DIAGNOSIS — J449 Chronic obstructive pulmonary disease, unspecified: Secondary | ICD-10-CM | POA: Diagnosis not present

## 2018-11-11 DIAGNOSIS — M1990 Unspecified osteoarthritis, unspecified site: Secondary | ICD-10-CM | POA: Diagnosis not present

## 2018-11-11 DIAGNOSIS — G4730 Sleep apnea, unspecified: Secondary | ICD-10-CM | POA: Diagnosis not present

## 2018-11-11 DIAGNOSIS — G589 Mononeuropathy, unspecified: Secondary | ICD-10-CM | POA: Diagnosis not present

## 2018-11-11 DIAGNOSIS — Z794 Long term (current) use of insulin: Secondary | ICD-10-CM | POA: Diagnosis not present

## 2018-11-11 DIAGNOSIS — I2510 Atherosclerotic heart disease of native coronary artery without angina pectoris: Secondary | ICD-10-CM | POA: Diagnosis not present

## 2018-11-11 DIAGNOSIS — E785 Hyperlipidemia, unspecified: Secondary | ICD-10-CM | POA: Diagnosis not present

## 2018-11-11 DIAGNOSIS — E119 Type 2 diabetes mellitus without complications: Secondary | ICD-10-CM | POA: Diagnosis not present

## 2018-11-11 DIAGNOSIS — I10 Essential (primary) hypertension: Secondary | ICD-10-CM | POA: Diagnosis not present

## 2018-11-11 DIAGNOSIS — M545 Low back pain: Secondary | ICD-10-CM | POA: Diagnosis not present

## 2018-11-12 DIAGNOSIS — Z794 Long term (current) use of insulin: Secondary | ICD-10-CM | POA: Diagnosis not present

## 2018-11-12 DIAGNOSIS — I2510 Atherosclerotic heart disease of native coronary artery without angina pectoris: Secondary | ICD-10-CM | POA: Diagnosis not present

## 2018-11-12 DIAGNOSIS — G4730 Sleep apnea, unspecified: Secondary | ICD-10-CM | POA: Diagnosis not present

## 2018-11-12 DIAGNOSIS — M545 Low back pain: Secondary | ICD-10-CM | POA: Diagnosis not present

## 2018-11-12 DIAGNOSIS — M1990 Unspecified osteoarthritis, unspecified site: Secondary | ICD-10-CM | POA: Diagnosis not present

## 2018-11-12 DIAGNOSIS — E119 Type 2 diabetes mellitus without complications: Secondary | ICD-10-CM | POA: Diagnosis not present

## 2018-11-12 DIAGNOSIS — J449 Chronic obstructive pulmonary disease, unspecified: Secondary | ICD-10-CM | POA: Diagnosis not present

## 2018-11-12 DIAGNOSIS — E785 Hyperlipidemia, unspecified: Secondary | ICD-10-CM | POA: Diagnosis not present

## 2018-11-12 DIAGNOSIS — G589 Mononeuropathy, unspecified: Secondary | ICD-10-CM | POA: Diagnosis not present

## 2018-11-12 DIAGNOSIS — I10 Essential (primary) hypertension: Secondary | ICD-10-CM | POA: Diagnosis not present

## 2018-11-16 DIAGNOSIS — J449 Chronic obstructive pulmonary disease, unspecified: Secondary | ICD-10-CM | POA: Diagnosis not present

## 2018-11-16 DIAGNOSIS — M545 Low back pain: Secondary | ICD-10-CM | POA: Diagnosis not present

## 2018-11-16 DIAGNOSIS — Z794 Long term (current) use of insulin: Secondary | ICD-10-CM | POA: Diagnosis not present

## 2018-11-16 DIAGNOSIS — G4730 Sleep apnea, unspecified: Secondary | ICD-10-CM | POA: Diagnosis not present

## 2018-11-16 DIAGNOSIS — M1990 Unspecified osteoarthritis, unspecified site: Secondary | ICD-10-CM | POA: Diagnosis not present

## 2018-11-16 DIAGNOSIS — I2510 Atherosclerotic heart disease of native coronary artery without angina pectoris: Secondary | ICD-10-CM | POA: Diagnosis not present

## 2018-11-16 DIAGNOSIS — E785 Hyperlipidemia, unspecified: Secondary | ICD-10-CM | POA: Diagnosis not present

## 2018-11-16 DIAGNOSIS — G589 Mononeuropathy, unspecified: Secondary | ICD-10-CM | POA: Diagnosis not present

## 2018-11-16 DIAGNOSIS — E119 Type 2 diabetes mellitus without complications: Secondary | ICD-10-CM | POA: Diagnosis not present

## 2018-11-16 DIAGNOSIS — I10 Essential (primary) hypertension: Secondary | ICD-10-CM | POA: Diagnosis not present

## 2018-11-20 DIAGNOSIS — M1990 Unspecified osteoarthritis, unspecified site: Secondary | ICD-10-CM | POA: Diagnosis not present

## 2018-11-20 DIAGNOSIS — Z794 Long term (current) use of insulin: Secondary | ICD-10-CM | POA: Diagnosis not present

## 2018-11-20 DIAGNOSIS — J449 Chronic obstructive pulmonary disease, unspecified: Secondary | ICD-10-CM | POA: Diagnosis not present

## 2018-11-20 DIAGNOSIS — G589 Mononeuropathy, unspecified: Secondary | ICD-10-CM | POA: Diagnosis not present

## 2018-11-20 DIAGNOSIS — I2510 Atherosclerotic heart disease of native coronary artery without angina pectoris: Secondary | ICD-10-CM | POA: Diagnosis not present

## 2018-11-20 DIAGNOSIS — G4730 Sleep apnea, unspecified: Secondary | ICD-10-CM | POA: Diagnosis not present

## 2018-11-20 DIAGNOSIS — E785 Hyperlipidemia, unspecified: Secondary | ICD-10-CM | POA: Diagnosis not present

## 2018-11-20 DIAGNOSIS — I10 Essential (primary) hypertension: Secondary | ICD-10-CM | POA: Diagnosis not present

## 2018-11-20 DIAGNOSIS — M545 Low back pain: Secondary | ICD-10-CM | POA: Diagnosis not present

## 2018-11-20 DIAGNOSIS — E119 Type 2 diabetes mellitus without complications: Secondary | ICD-10-CM | POA: Diagnosis not present

## 2018-11-23 DIAGNOSIS — J449 Chronic obstructive pulmonary disease, unspecified: Secondary | ICD-10-CM | POA: Diagnosis not present

## 2018-11-23 DIAGNOSIS — Z794 Long term (current) use of insulin: Secondary | ICD-10-CM | POA: Diagnosis not present

## 2018-11-23 DIAGNOSIS — E119 Type 2 diabetes mellitus without complications: Secondary | ICD-10-CM | POA: Diagnosis not present

## 2018-11-23 DIAGNOSIS — M545 Low back pain: Secondary | ICD-10-CM | POA: Diagnosis not present

## 2018-11-23 DIAGNOSIS — M1990 Unspecified osteoarthritis, unspecified site: Secondary | ICD-10-CM | POA: Diagnosis not present

## 2018-11-23 DIAGNOSIS — I10 Essential (primary) hypertension: Secondary | ICD-10-CM | POA: Diagnosis not present

## 2018-11-23 DIAGNOSIS — E785 Hyperlipidemia, unspecified: Secondary | ICD-10-CM | POA: Diagnosis not present

## 2018-11-23 DIAGNOSIS — G4730 Sleep apnea, unspecified: Secondary | ICD-10-CM | POA: Diagnosis not present

## 2018-11-23 DIAGNOSIS — I2510 Atherosclerotic heart disease of native coronary artery without angina pectoris: Secondary | ICD-10-CM | POA: Diagnosis not present

## 2018-11-23 DIAGNOSIS — G589 Mononeuropathy, unspecified: Secondary | ICD-10-CM | POA: Diagnosis not present

## 2018-11-24 DIAGNOSIS — Z794 Long term (current) use of insulin: Secondary | ICD-10-CM | POA: Diagnosis not present

## 2018-11-24 DIAGNOSIS — I2510 Atherosclerotic heart disease of native coronary artery without angina pectoris: Secondary | ICD-10-CM | POA: Diagnosis not present

## 2018-11-24 DIAGNOSIS — G589 Mononeuropathy, unspecified: Secondary | ICD-10-CM | POA: Diagnosis not present

## 2018-11-24 DIAGNOSIS — M1990 Unspecified osteoarthritis, unspecified site: Secondary | ICD-10-CM | POA: Diagnosis not present

## 2018-11-24 DIAGNOSIS — I10 Essential (primary) hypertension: Secondary | ICD-10-CM | POA: Diagnosis not present

## 2018-11-24 DIAGNOSIS — E785 Hyperlipidemia, unspecified: Secondary | ICD-10-CM | POA: Diagnosis not present

## 2018-11-24 DIAGNOSIS — J449 Chronic obstructive pulmonary disease, unspecified: Secondary | ICD-10-CM | POA: Diagnosis not present

## 2018-11-24 DIAGNOSIS — E119 Type 2 diabetes mellitus without complications: Secondary | ICD-10-CM | POA: Diagnosis not present

## 2018-11-24 DIAGNOSIS — G4730 Sleep apnea, unspecified: Secondary | ICD-10-CM | POA: Diagnosis not present

## 2018-11-24 DIAGNOSIS — M545 Low back pain: Secondary | ICD-10-CM | POA: Diagnosis not present

## 2018-11-26 DIAGNOSIS — M1990 Unspecified osteoarthritis, unspecified site: Secondary | ICD-10-CM | POA: Diagnosis not present

## 2018-11-26 DIAGNOSIS — E785 Hyperlipidemia, unspecified: Secondary | ICD-10-CM | POA: Diagnosis not present

## 2018-11-26 DIAGNOSIS — J449 Chronic obstructive pulmonary disease, unspecified: Secondary | ICD-10-CM | POA: Diagnosis not present

## 2018-11-26 DIAGNOSIS — M545 Low back pain: Secondary | ICD-10-CM | POA: Diagnosis not present

## 2018-11-26 DIAGNOSIS — I2510 Atherosclerotic heart disease of native coronary artery without angina pectoris: Secondary | ICD-10-CM | POA: Diagnosis not present

## 2018-11-26 DIAGNOSIS — G589 Mononeuropathy, unspecified: Secondary | ICD-10-CM | POA: Diagnosis not present

## 2018-11-26 DIAGNOSIS — I10 Essential (primary) hypertension: Secondary | ICD-10-CM | POA: Diagnosis not present

## 2018-11-26 DIAGNOSIS — G4730 Sleep apnea, unspecified: Secondary | ICD-10-CM | POA: Diagnosis not present

## 2018-11-26 DIAGNOSIS — E119 Type 2 diabetes mellitus without complications: Secondary | ICD-10-CM | POA: Diagnosis not present

## 2018-11-26 DIAGNOSIS — Z794 Long term (current) use of insulin: Secondary | ICD-10-CM | POA: Diagnosis not present

## 2018-11-30 DIAGNOSIS — Z794 Long term (current) use of insulin: Secondary | ICD-10-CM | POA: Diagnosis not present

## 2018-11-30 DIAGNOSIS — M1990 Unspecified osteoarthritis, unspecified site: Secondary | ICD-10-CM | POA: Diagnosis not present

## 2018-11-30 DIAGNOSIS — J449 Chronic obstructive pulmonary disease, unspecified: Secondary | ICD-10-CM | POA: Diagnosis not present

## 2018-11-30 DIAGNOSIS — I2510 Atherosclerotic heart disease of native coronary artery without angina pectoris: Secondary | ICD-10-CM | POA: Diagnosis not present

## 2018-11-30 DIAGNOSIS — M545 Low back pain: Secondary | ICD-10-CM | POA: Diagnosis not present

## 2018-11-30 DIAGNOSIS — E785 Hyperlipidemia, unspecified: Secondary | ICD-10-CM | POA: Diagnosis not present

## 2018-11-30 DIAGNOSIS — E119 Type 2 diabetes mellitus without complications: Secondary | ICD-10-CM | POA: Diagnosis not present

## 2018-11-30 DIAGNOSIS — G589 Mononeuropathy, unspecified: Secondary | ICD-10-CM | POA: Diagnosis not present

## 2018-11-30 DIAGNOSIS — G4730 Sleep apnea, unspecified: Secondary | ICD-10-CM | POA: Diagnosis not present

## 2018-11-30 DIAGNOSIS — I10 Essential (primary) hypertension: Secondary | ICD-10-CM | POA: Diagnosis not present

## 2018-12-02 DIAGNOSIS — G4730 Sleep apnea, unspecified: Secondary | ICD-10-CM | POA: Diagnosis not present

## 2018-12-02 DIAGNOSIS — M545 Low back pain: Secondary | ICD-10-CM | POA: Diagnosis not present

## 2018-12-02 DIAGNOSIS — J449 Chronic obstructive pulmonary disease, unspecified: Secondary | ICD-10-CM | POA: Diagnosis not present

## 2018-12-02 DIAGNOSIS — I10 Essential (primary) hypertension: Secondary | ICD-10-CM | POA: Diagnosis not present

## 2018-12-02 DIAGNOSIS — M1990 Unspecified osteoarthritis, unspecified site: Secondary | ICD-10-CM | POA: Diagnosis not present

## 2018-12-02 DIAGNOSIS — G589 Mononeuropathy, unspecified: Secondary | ICD-10-CM | POA: Diagnosis not present

## 2018-12-02 DIAGNOSIS — Z794 Long term (current) use of insulin: Secondary | ICD-10-CM | POA: Diagnosis not present

## 2018-12-02 DIAGNOSIS — I2510 Atherosclerotic heart disease of native coronary artery without angina pectoris: Secondary | ICD-10-CM | POA: Diagnosis not present

## 2018-12-02 DIAGNOSIS — E119 Type 2 diabetes mellitus without complications: Secondary | ICD-10-CM | POA: Diagnosis not present

## 2018-12-02 DIAGNOSIS — E785 Hyperlipidemia, unspecified: Secondary | ICD-10-CM | POA: Diagnosis not present

## 2018-12-03 DIAGNOSIS — J449 Chronic obstructive pulmonary disease, unspecified: Secondary | ICD-10-CM | POA: Diagnosis not present

## 2018-12-03 DIAGNOSIS — J189 Pneumonia, unspecified organism: Secondary | ICD-10-CM | POA: Diagnosis not present

## 2018-12-03 DIAGNOSIS — S72141D Displaced intertrochanteric fracture of right femur, subsequent encounter for closed fracture with routine healing: Secondary | ICD-10-CM | POA: Diagnosis not present

## 2018-12-07 DIAGNOSIS — E119 Type 2 diabetes mellitus without complications: Secondary | ICD-10-CM | POA: Diagnosis not present

## 2018-12-07 DIAGNOSIS — M1990 Unspecified osteoarthritis, unspecified site: Secondary | ICD-10-CM | POA: Diagnosis not present

## 2018-12-07 DIAGNOSIS — I2510 Atherosclerotic heart disease of native coronary artery without angina pectoris: Secondary | ICD-10-CM | POA: Diagnosis not present

## 2018-12-07 DIAGNOSIS — Z794 Long term (current) use of insulin: Secondary | ICD-10-CM | POA: Diagnosis not present

## 2018-12-07 DIAGNOSIS — J449 Chronic obstructive pulmonary disease, unspecified: Secondary | ICD-10-CM | POA: Diagnosis not present

## 2018-12-07 DIAGNOSIS — G589 Mononeuropathy, unspecified: Secondary | ICD-10-CM | POA: Diagnosis not present

## 2018-12-07 DIAGNOSIS — I10 Essential (primary) hypertension: Secondary | ICD-10-CM | POA: Diagnosis not present

## 2018-12-07 DIAGNOSIS — E785 Hyperlipidemia, unspecified: Secondary | ICD-10-CM | POA: Diagnosis not present

## 2018-12-07 DIAGNOSIS — M545 Low back pain: Secondary | ICD-10-CM | POA: Diagnosis not present

## 2018-12-07 DIAGNOSIS — G4730 Sleep apnea, unspecified: Secondary | ICD-10-CM | POA: Diagnosis not present

## 2018-12-09 DIAGNOSIS — M1990 Unspecified osteoarthritis, unspecified site: Secondary | ICD-10-CM | POA: Diagnosis not present

## 2018-12-09 DIAGNOSIS — E119 Type 2 diabetes mellitus without complications: Secondary | ICD-10-CM | POA: Diagnosis not present

## 2018-12-09 DIAGNOSIS — I10 Essential (primary) hypertension: Secondary | ICD-10-CM | POA: Diagnosis not present

## 2018-12-09 DIAGNOSIS — G589 Mononeuropathy, unspecified: Secondary | ICD-10-CM | POA: Diagnosis not present

## 2018-12-09 DIAGNOSIS — J449 Chronic obstructive pulmonary disease, unspecified: Secondary | ICD-10-CM | POA: Diagnosis not present

## 2018-12-09 DIAGNOSIS — E785 Hyperlipidemia, unspecified: Secondary | ICD-10-CM | POA: Diagnosis not present

## 2018-12-09 DIAGNOSIS — M545 Low back pain: Secondary | ICD-10-CM | POA: Diagnosis not present

## 2018-12-09 DIAGNOSIS — I2510 Atherosclerotic heart disease of native coronary artery without angina pectoris: Secondary | ICD-10-CM | POA: Diagnosis not present

## 2018-12-09 DIAGNOSIS — Z794 Long term (current) use of insulin: Secondary | ICD-10-CM | POA: Diagnosis not present

## 2018-12-09 DIAGNOSIS — G4730 Sleep apnea, unspecified: Secondary | ICD-10-CM | POA: Diagnosis not present

## 2018-12-14 DIAGNOSIS — Z794 Long term (current) use of insulin: Secondary | ICD-10-CM | POA: Diagnosis not present

## 2018-12-14 DIAGNOSIS — I2510 Atherosclerotic heart disease of native coronary artery without angina pectoris: Secondary | ICD-10-CM | POA: Diagnosis not present

## 2018-12-14 DIAGNOSIS — J449 Chronic obstructive pulmonary disease, unspecified: Secondary | ICD-10-CM | POA: Diagnosis not present

## 2018-12-14 DIAGNOSIS — E785 Hyperlipidemia, unspecified: Secondary | ICD-10-CM | POA: Diagnosis not present

## 2018-12-14 DIAGNOSIS — E119 Type 2 diabetes mellitus without complications: Secondary | ICD-10-CM | POA: Diagnosis not present

## 2018-12-14 DIAGNOSIS — I10 Essential (primary) hypertension: Secondary | ICD-10-CM | POA: Diagnosis not present

## 2018-12-14 DIAGNOSIS — G4730 Sleep apnea, unspecified: Secondary | ICD-10-CM | POA: Diagnosis not present

## 2018-12-14 DIAGNOSIS — G589 Mononeuropathy, unspecified: Secondary | ICD-10-CM | POA: Diagnosis not present

## 2018-12-14 DIAGNOSIS — M1990 Unspecified osteoarthritis, unspecified site: Secondary | ICD-10-CM | POA: Diagnosis not present

## 2018-12-14 DIAGNOSIS — M545 Low back pain: Secondary | ICD-10-CM | POA: Diagnosis not present

## 2018-12-16 DIAGNOSIS — G4730 Sleep apnea, unspecified: Secondary | ICD-10-CM | POA: Diagnosis not present

## 2018-12-16 DIAGNOSIS — G589 Mononeuropathy, unspecified: Secondary | ICD-10-CM | POA: Diagnosis not present

## 2018-12-16 DIAGNOSIS — M545 Low back pain: Secondary | ICD-10-CM | POA: Diagnosis not present

## 2018-12-16 DIAGNOSIS — M1990 Unspecified osteoarthritis, unspecified site: Secondary | ICD-10-CM | POA: Diagnosis not present

## 2018-12-16 DIAGNOSIS — Z794 Long term (current) use of insulin: Secondary | ICD-10-CM | POA: Diagnosis not present

## 2018-12-16 DIAGNOSIS — J449 Chronic obstructive pulmonary disease, unspecified: Secondary | ICD-10-CM | POA: Diagnosis not present

## 2018-12-16 DIAGNOSIS — I2510 Atherosclerotic heart disease of native coronary artery without angina pectoris: Secondary | ICD-10-CM | POA: Diagnosis not present

## 2018-12-16 DIAGNOSIS — E785 Hyperlipidemia, unspecified: Secondary | ICD-10-CM | POA: Diagnosis not present

## 2018-12-16 DIAGNOSIS — I10 Essential (primary) hypertension: Secondary | ICD-10-CM | POA: Diagnosis not present

## 2018-12-16 DIAGNOSIS — E119 Type 2 diabetes mellitus without complications: Secondary | ICD-10-CM | POA: Diagnosis not present

## 2018-12-17 DIAGNOSIS — I10 Essential (primary) hypertension: Secondary | ICD-10-CM | POA: Diagnosis not present

## 2018-12-17 DIAGNOSIS — M1990 Unspecified osteoarthritis, unspecified site: Secondary | ICD-10-CM | POA: Diagnosis not present

## 2018-12-17 DIAGNOSIS — E119 Type 2 diabetes mellitus without complications: Secondary | ICD-10-CM | POA: Diagnosis not present

## 2018-12-17 DIAGNOSIS — J449 Chronic obstructive pulmonary disease, unspecified: Secondary | ICD-10-CM | POA: Diagnosis not present

## 2018-12-17 DIAGNOSIS — Z794 Long term (current) use of insulin: Secondary | ICD-10-CM | POA: Diagnosis not present

## 2018-12-17 DIAGNOSIS — G4730 Sleep apnea, unspecified: Secondary | ICD-10-CM | POA: Diagnosis not present

## 2018-12-17 DIAGNOSIS — E785 Hyperlipidemia, unspecified: Secondary | ICD-10-CM | POA: Diagnosis not present

## 2018-12-17 DIAGNOSIS — M545 Low back pain: Secondary | ICD-10-CM | POA: Diagnosis not present

## 2018-12-17 DIAGNOSIS — I2510 Atherosclerotic heart disease of native coronary artery without angina pectoris: Secondary | ICD-10-CM | POA: Diagnosis not present

## 2018-12-17 DIAGNOSIS — G588 Other specified mononeuropathies: Secondary | ICD-10-CM | POA: Diagnosis not present

## 2018-12-21 DIAGNOSIS — I10 Essential (primary) hypertension: Secondary | ICD-10-CM | POA: Diagnosis not present

## 2018-12-21 DIAGNOSIS — I2510 Atherosclerotic heart disease of native coronary artery without angina pectoris: Secondary | ICD-10-CM | POA: Diagnosis not present

## 2018-12-21 DIAGNOSIS — M545 Low back pain: Secondary | ICD-10-CM | POA: Diagnosis not present

## 2018-12-21 DIAGNOSIS — E119 Type 2 diabetes mellitus without complications: Secondary | ICD-10-CM | POA: Diagnosis not present

## 2018-12-21 DIAGNOSIS — M1990 Unspecified osteoarthritis, unspecified site: Secondary | ICD-10-CM | POA: Diagnosis not present

## 2018-12-21 DIAGNOSIS — G588 Other specified mononeuropathies: Secondary | ICD-10-CM | POA: Diagnosis not present

## 2018-12-21 DIAGNOSIS — Z794 Long term (current) use of insulin: Secondary | ICD-10-CM | POA: Diagnosis not present

## 2018-12-21 DIAGNOSIS — G4730 Sleep apnea, unspecified: Secondary | ICD-10-CM | POA: Diagnosis not present

## 2018-12-21 DIAGNOSIS — J449 Chronic obstructive pulmonary disease, unspecified: Secondary | ICD-10-CM | POA: Diagnosis not present

## 2018-12-21 DIAGNOSIS — E785 Hyperlipidemia, unspecified: Secondary | ICD-10-CM | POA: Diagnosis not present

## 2018-12-22 DIAGNOSIS — I10 Essential (primary) hypertension: Secondary | ICD-10-CM | POA: Diagnosis not present

## 2018-12-22 DIAGNOSIS — J441 Chronic obstructive pulmonary disease with (acute) exacerbation: Secondary | ICD-10-CM | POA: Diagnosis not present

## 2018-12-22 DIAGNOSIS — I2510 Atherosclerotic heart disease of native coronary artery without angina pectoris: Secondary | ICD-10-CM | POA: Diagnosis not present

## 2018-12-22 DIAGNOSIS — E119 Type 2 diabetes mellitus without complications: Secondary | ICD-10-CM | POA: Diagnosis not present

## 2018-12-23 DIAGNOSIS — I10 Essential (primary) hypertension: Secondary | ICD-10-CM | POA: Diagnosis not present

## 2018-12-23 DIAGNOSIS — M545 Low back pain: Secondary | ICD-10-CM | POA: Diagnosis not present

## 2018-12-23 DIAGNOSIS — Z794 Long term (current) use of insulin: Secondary | ICD-10-CM | POA: Diagnosis not present

## 2018-12-23 DIAGNOSIS — E119 Type 2 diabetes mellitus without complications: Secondary | ICD-10-CM | POA: Diagnosis not present

## 2018-12-23 DIAGNOSIS — E785 Hyperlipidemia, unspecified: Secondary | ICD-10-CM | POA: Diagnosis not present

## 2018-12-23 DIAGNOSIS — G4730 Sleep apnea, unspecified: Secondary | ICD-10-CM | POA: Diagnosis not present

## 2018-12-23 DIAGNOSIS — I2510 Atherosclerotic heart disease of native coronary artery without angina pectoris: Secondary | ICD-10-CM | POA: Diagnosis not present

## 2018-12-23 DIAGNOSIS — G588 Other specified mononeuropathies: Secondary | ICD-10-CM | POA: Diagnosis not present

## 2018-12-23 DIAGNOSIS — M1990 Unspecified osteoarthritis, unspecified site: Secondary | ICD-10-CM | POA: Diagnosis not present

## 2018-12-23 DIAGNOSIS — J449 Chronic obstructive pulmonary disease, unspecified: Secondary | ICD-10-CM | POA: Diagnosis not present

## 2018-12-28 DIAGNOSIS — J449 Chronic obstructive pulmonary disease, unspecified: Secondary | ICD-10-CM | POA: Diagnosis not present

## 2018-12-28 DIAGNOSIS — G4730 Sleep apnea, unspecified: Secondary | ICD-10-CM | POA: Diagnosis not present

## 2018-12-28 DIAGNOSIS — I10 Essential (primary) hypertension: Secondary | ICD-10-CM | POA: Diagnosis not present

## 2018-12-28 DIAGNOSIS — I2510 Atherosclerotic heart disease of native coronary artery without angina pectoris: Secondary | ICD-10-CM | POA: Diagnosis not present

## 2018-12-28 DIAGNOSIS — G588 Other specified mononeuropathies: Secondary | ICD-10-CM | POA: Diagnosis not present

## 2018-12-28 DIAGNOSIS — M1990 Unspecified osteoarthritis, unspecified site: Secondary | ICD-10-CM | POA: Diagnosis not present

## 2018-12-28 DIAGNOSIS — M545 Low back pain: Secondary | ICD-10-CM | POA: Diagnosis not present

## 2018-12-28 DIAGNOSIS — Z794 Long term (current) use of insulin: Secondary | ICD-10-CM | POA: Diagnosis not present

## 2018-12-28 DIAGNOSIS — E119 Type 2 diabetes mellitus without complications: Secondary | ICD-10-CM | POA: Diagnosis not present

## 2018-12-28 DIAGNOSIS — E785 Hyperlipidemia, unspecified: Secondary | ICD-10-CM | POA: Diagnosis not present

## 2018-12-30 DIAGNOSIS — M545 Low back pain: Secondary | ICD-10-CM | POA: Diagnosis not present

## 2018-12-30 DIAGNOSIS — G588 Other specified mononeuropathies: Secondary | ICD-10-CM | POA: Diagnosis not present

## 2018-12-30 DIAGNOSIS — J449 Chronic obstructive pulmonary disease, unspecified: Secondary | ICD-10-CM | POA: Diagnosis not present

## 2018-12-30 DIAGNOSIS — M1990 Unspecified osteoarthritis, unspecified site: Secondary | ICD-10-CM | POA: Diagnosis not present

## 2018-12-30 DIAGNOSIS — I10 Essential (primary) hypertension: Secondary | ICD-10-CM | POA: Diagnosis not present

## 2018-12-30 DIAGNOSIS — Z794 Long term (current) use of insulin: Secondary | ICD-10-CM | POA: Diagnosis not present

## 2018-12-30 DIAGNOSIS — G4730 Sleep apnea, unspecified: Secondary | ICD-10-CM | POA: Diagnosis not present

## 2018-12-30 DIAGNOSIS — E119 Type 2 diabetes mellitus without complications: Secondary | ICD-10-CM | POA: Diagnosis not present

## 2018-12-30 DIAGNOSIS — E785 Hyperlipidemia, unspecified: Secondary | ICD-10-CM | POA: Diagnosis not present

## 2018-12-30 DIAGNOSIS — I2510 Atherosclerotic heart disease of native coronary artery without angina pectoris: Secondary | ICD-10-CM | POA: Diagnosis not present

## 2019-01-03 DIAGNOSIS — J449 Chronic obstructive pulmonary disease, unspecified: Secondary | ICD-10-CM | POA: Diagnosis not present

## 2019-01-03 DIAGNOSIS — S72141D Displaced intertrochanteric fracture of right femur, subsequent encounter for closed fracture with routine healing: Secondary | ICD-10-CM | POA: Diagnosis not present

## 2019-01-03 DIAGNOSIS — J189 Pneumonia, unspecified organism: Secondary | ICD-10-CM | POA: Diagnosis not present

## 2019-01-04 DIAGNOSIS — M545 Low back pain: Secondary | ICD-10-CM | POA: Diagnosis not present

## 2019-01-04 DIAGNOSIS — E785 Hyperlipidemia, unspecified: Secondary | ICD-10-CM | POA: Diagnosis not present

## 2019-01-04 DIAGNOSIS — J449 Chronic obstructive pulmonary disease, unspecified: Secondary | ICD-10-CM | POA: Diagnosis not present

## 2019-01-04 DIAGNOSIS — I2510 Atherosclerotic heart disease of native coronary artery without angina pectoris: Secondary | ICD-10-CM | POA: Diagnosis not present

## 2019-01-04 DIAGNOSIS — Z794 Long term (current) use of insulin: Secondary | ICD-10-CM | POA: Diagnosis not present

## 2019-01-04 DIAGNOSIS — I10 Essential (primary) hypertension: Secondary | ICD-10-CM | POA: Diagnosis not present

## 2019-01-04 DIAGNOSIS — E119 Type 2 diabetes mellitus without complications: Secondary | ICD-10-CM | POA: Diagnosis not present

## 2019-01-04 DIAGNOSIS — G588 Other specified mononeuropathies: Secondary | ICD-10-CM | POA: Diagnosis not present

## 2019-01-04 DIAGNOSIS — M1990 Unspecified osteoarthritis, unspecified site: Secondary | ICD-10-CM | POA: Diagnosis not present

## 2019-01-04 DIAGNOSIS — G4730 Sleep apnea, unspecified: Secondary | ICD-10-CM | POA: Diagnosis not present

## 2019-01-06 DIAGNOSIS — M1990 Unspecified osteoarthritis, unspecified site: Secondary | ICD-10-CM | POA: Diagnosis not present

## 2019-01-06 DIAGNOSIS — Z794 Long term (current) use of insulin: Secondary | ICD-10-CM | POA: Diagnosis not present

## 2019-01-06 DIAGNOSIS — G588 Other specified mononeuropathies: Secondary | ICD-10-CM | POA: Diagnosis not present

## 2019-01-06 DIAGNOSIS — I2510 Atherosclerotic heart disease of native coronary artery without angina pectoris: Secondary | ICD-10-CM | POA: Diagnosis not present

## 2019-01-06 DIAGNOSIS — G4730 Sleep apnea, unspecified: Secondary | ICD-10-CM | POA: Diagnosis not present

## 2019-01-06 DIAGNOSIS — E119 Type 2 diabetes mellitus without complications: Secondary | ICD-10-CM | POA: Diagnosis not present

## 2019-01-06 DIAGNOSIS — J449 Chronic obstructive pulmonary disease, unspecified: Secondary | ICD-10-CM | POA: Diagnosis not present

## 2019-01-06 DIAGNOSIS — M545 Low back pain: Secondary | ICD-10-CM | POA: Diagnosis not present

## 2019-01-06 DIAGNOSIS — I10 Essential (primary) hypertension: Secondary | ICD-10-CM | POA: Diagnosis not present

## 2019-01-06 DIAGNOSIS — E785 Hyperlipidemia, unspecified: Secondary | ICD-10-CM | POA: Diagnosis not present

## 2019-01-11 DIAGNOSIS — Z794 Long term (current) use of insulin: Secondary | ICD-10-CM | POA: Diagnosis not present

## 2019-01-11 DIAGNOSIS — G4730 Sleep apnea, unspecified: Secondary | ICD-10-CM | POA: Diagnosis not present

## 2019-01-11 DIAGNOSIS — J449 Chronic obstructive pulmonary disease, unspecified: Secondary | ICD-10-CM | POA: Diagnosis not present

## 2019-01-11 DIAGNOSIS — E785 Hyperlipidemia, unspecified: Secondary | ICD-10-CM | POA: Diagnosis not present

## 2019-01-11 DIAGNOSIS — M545 Low back pain: Secondary | ICD-10-CM | POA: Diagnosis not present

## 2019-01-11 DIAGNOSIS — E119 Type 2 diabetes mellitus without complications: Secondary | ICD-10-CM | POA: Diagnosis not present

## 2019-01-11 DIAGNOSIS — G588 Other specified mononeuropathies: Secondary | ICD-10-CM | POA: Diagnosis not present

## 2019-01-11 DIAGNOSIS — M1990 Unspecified osteoarthritis, unspecified site: Secondary | ICD-10-CM | POA: Diagnosis not present

## 2019-01-11 DIAGNOSIS — I10 Essential (primary) hypertension: Secondary | ICD-10-CM | POA: Diagnosis not present

## 2019-01-11 DIAGNOSIS — I2510 Atherosclerotic heart disease of native coronary artery without angina pectoris: Secondary | ICD-10-CM | POA: Diagnosis not present

## 2019-01-13 DIAGNOSIS — G588 Other specified mononeuropathies: Secondary | ICD-10-CM | POA: Diagnosis not present

## 2019-01-13 DIAGNOSIS — J449 Chronic obstructive pulmonary disease, unspecified: Secondary | ICD-10-CM | POA: Diagnosis not present

## 2019-01-13 DIAGNOSIS — Z794 Long term (current) use of insulin: Secondary | ICD-10-CM | POA: Diagnosis not present

## 2019-01-13 DIAGNOSIS — E785 Hyperlipidemia, unspecified: Secondary | ICD-10-CM | POA: Diagnosis not present

## 2019-01-13 DIAGNOSIS — G4730 Sleep apnea, unspecified: Secondary | ICD-10-CM | POA: Diagnosis not present

## 2019-01-13 DIAGNOSIS — I2510 Atherosclerotic heart disease of native coronary artery without angina pectoris: Secondary | ICD-10-CM | POA: Diagnosis not present

## 2019-01-13 DIAGNOSIS — M545 Low back pain: Secondary | ICD-10-CM | POA: Diagnosis not present

## 2019-01-13 DIAGNOSIS — E119 Type 2 diabetes mellitus without complications: Secondary | ICD-10-CM | POA: Diagnosis not present

## 2019-01-13 DIAGNOSIS — M1990 Unspecified osteoarthritis, unspecified site: Secondary | ICD-10-CM | POA: Diagnosis not present

## 2019-01-13 DIAGNOSIS — I10 Essential (primary) hypertension: Secondary | ICD-10-CM | POA: Diagnosis not present

## 2019-01-14 DIAGNOSIS — E1140 Type 2 diabetes mellitus with diabetic neuropathy, unspecified: Secondary | ICD-10-CM | POA: Diagnosis not present

## 2019-01-14 DIAGNOSIS — E1151 Type 2 diabetes mellitus with diabetic peripheral angiopathy without gangrene: Secondary | ICD-10-CM | POA: Diagnosis not present

## 2019-01-16 DIAGNOSIS — M545 Low back pain: Secondary | ICD-10-CM | POA: Diagnosis not present

## 2019-01-16 DIAGNOSIS — Z794 Long term (current) use of insulin: Secondary | ICD-10-CM | POA: Diagnosis not present

## 2019-01-16 DIAGNOSIS — M1990 Unspecified osteoarthritis, unspecified site: Secondary | ICD-10-CM | POA: Diagnosis not present

## 2019-01-16 DIAGNOSIS — E119 Type 2 diabetes mellitus without complications: Secondary | ICD-10-CM | POA: Diagnosis not present

## 2019-01-16 DIAGNOSIS — J449 Chronic obstructive pulmonary disease, unspecified: Secondary | ICD-10-CM | POA: Diagnosis not present

## 2019-01-16 DIAGNOSIS — I2510 Atherosclerotic heart disease of native coronary artery without angina pectoris: Secondary | ICD-10-CM | POA: Diagnosis not present

## 2019-01-16 DIAGNOSIS — E785 Hyperlipidemia, unspecified: Secondary | ICD-10-CM | POA: Diagnosis not present

## 2019-01-16 DIAGNOSIS — I10 Essential (primary) hypertension: Secondary | ICD-10-CM | POA: Diagnosis not present

## 2019-01-16 DIAGNOSIS — G588 Other specified mononeuropathies: Secondary | ICD-10-CM | POA: Diagnosis not present

## 2019-01-16 DIAGNOSIS — G4730 Sleep apnea, unspecified: Secondary | ICD-10-CM | POA: Diagnosis not present

## 2019-01-18 DIAGNOSIS — G588 Other specified mononeuropathies: Secondary | ICD-10-CM | POA: Diagnosis not present

## 2019-01-18 DIAGNOSIS — Z794 Long term (current) use of insulin: Secondary | ICD-10-CM | POA: Diagnosis not present

## 2019-01-18 DIAGNOSIS — I2510 Atherosclerotic heart disease of native coronary artery without angina pectoris: Secondary | ICD-10-CM | POA: Diagnosis not present

## 2019-01-18 DIAGNOSIS — M1990 Unspecified osteoarthritis, unspecified site: Secondary | ICD-10-CM | POA: Diagnosis not present

## 2019-01-18 DIAGNOSIS — M545 Low back pain: Secondary | ICD-10-CM | POA: Diagnosis not present

## 2019-01-18 DIAGNOSIS — E785 Hyperlipidemia, unspecified: Secondary | ICD-10-CM | POA: Diagnosis not present

## 2019-01-18 DIAGNOSIS — J449 Chronic obstructive pulmonary disease, unspecified: Secondary | ICD-10-CM | POA: Diagnosis not present

## 2019-01-18 DIAGNOSIS — G4730 Sleep apnea, unspecified: Secondary | ICD-10-CM | POA: Diagnosis not present

## 2019-01-18 DIAGNOSIS — E119 Type 2 diabetes mellitus without complications: Secondary | ICD-10-CM | POA: Diagnosis not present

## 2019-01-18 DIAGNOSIS — I10 Essential (primary) hypertension: Secondary | ICD-10-CM | POA: Diagnosis not present

## 2019-01-20 DIAGNOSIS — G4730 Sleep apnea, unspecified: Secondary | ICD-10-CM | POA: Diagnosis not present

## 2019-01-20 DIAGNOSIS — Z794 Long term (current) use of insulin: Secondary | ICD-10-CM | POA: Diagnosis not present

## 2019-01-20 DIAGNOSIS — G588 Other specified mononeuropathies: Secondary | ICD-10-CM | POA: Diagnosis not present

## 2019-01-20 DIAGNOSIS — M1990 Unspecified osteoarthritis, unspecified site: Secondary | ICD-10-CM | POA: Diagnosis not present

## 2019-01-20 DIAGNOSIS — I2510 Atherosclerotic heart disease of native coronary artery without angina pectoris: Secondary | ICD-10-CM | POA: Diagnosis not present

## 2019-01-20 DIAGNOSIS — M545 Low back pain: Secondary | ICD-10-CM | POA: Diagnosis not present

## 2019-01-20 DIAGNOSIS — E785 Hyperlipidemia, unspecified: Secondary | ICD-10-CM | POA: Diagnosis not present

## 2019-01-20 DIAGNOSIS — I10 Essential (primary) hypertension: Secondary | ICD-10-CM | POA: Diagnosis not present

## 2019-01-20 DIAGNOSIS — E119 Type 2 diabetes mellitus without complications: Secondary | ICD-10-CM | POA: Diagnosis not present

## 2019-01-20 DIAGNOSIS — J449 Chronic obstructive pulmonary disease, unspecified: Secondary | ICD-10-CM | POA: Diagnosis not present

## 2019-01-25 DIAGNOSIS — Z794 Long term (current) use of insulin: Secondary | ICD-10-CM | POA: Diagnosis not present

## 2019-01-25 DIAGNOSIS — M545 Low back pain: Secondary | ICD-10-CM | POA: Diagnosis not present

## 2019-01-25 DIAGNOSIS — E785 Hyperlipidemia, unspecified: Secondary | ICD-10-CM | POA: Diagnosis not present

## 2019-01-25 DIAGNOSIS — I2510 Atherosclerotic heart disease of native coronary artery without angina pectoris: Secondary | ICD-10-CM | POA: Diagnosis not present

## 2019-01-25 DIAGNOSIS — J449 Chronic obstructive pulmonary disease, unspecified: Secondary | ICD-10-CM | POA: Diagnosis not present

## 2019-01-25 DIAGNOSIS — I10 Essential (primary) hypertension: Secondary | ICD-10-CM | POA: Diagnosis not present

## 2019-01-25 DIAGNOSIS — M1990 Unspecified osteoarthritis, unspecified site: Secondary | ICD-10-CM | POA: Diagnosis not present

## 2019-01-25 DIAGNOSIS — G4730 Sleep apnea, unspecified: Secondary | ICD-10-CM | POA: Diagnosis not present

## 2019-01-25 DIAGNOSIS — G588 Other specified mononeuropathies: Secondary | ICD-10-CM | POA: Diagnosis not present

## 2019-01-25 DIAGNOSIS — E119 Type 2 diabetes mellitus without complications: Secondary | ICD-10-CM | POA: Diagnosis not present

## 2019-01-27 DIAGNOSIS — E119 Type 2 diabetes mellitus without complications: Secondary | ICD-10-CM | POA: Diagnosis not present

## 2019-01-27 DIAGNOSIS — Z794 Long term (current) use of insulin: Secondary | ICD-10-CM | POA: Diagnosis not present

## 2019-01-27 DIAGNOSIS — M545 Low back pain: Secondary | ICD-10-CM | POA: Diagnosis not present

## 2019-01-27 DIAGNOSIS — I10 Essential (primary) hypertension: Secondary | ICD-10-CM | POA: Diagnosis not present

## 2019-01-27 DIAGNOSIS — M1990 Unspecified osteoarthritis, unspecified site: Secondary | ICD-10-CM | POA: Diagnosis not present

## 2019-01-27 DIAGNOSIS — G4730 Sleep apnea, unspecified: Secondary | ICD-10-CM | POA: Diagnosis not present

## 2019-01-27 DIAGNOSIS — G588 Other specified mononeuropathies: Secondary | ICD-10-CM | POA: Diagnosis not present

## 2019-01-27 DIAGNOSIS — J449 Chronic obstructive pulmonary disease, unspecified: Secondary | ICD-10-CM | POA: Diagnosis not present

## 2019-01-27 DIAGNOSIS — I2510 Atherosclerotic heart disease of native coronary artery without angina pectoris: Secondary | ICD-10-CM | POA: Diagnosis not present

## 2019-01-27 DIAGNOSIS — E785 Hyperlipidemia, unspecified: Secondary | ICD-10-CM | POA: Diagnosis not present

## 2019-01-29 ENCOUNTER — Other Ambulatory Visit: Payer: Self-pay | Admitting: "Endocrinology

## 2019-02-01 DIAGNOSIS — J449 Chronic obstructive pulmonary disease, unspecified: Secondary | ICD-10-CM | POA: Diagnosis not present

## 2019-02-01 DIAGNOSIS — I2510 Atherosclerotic heart disease of native coronary artery without angina pectoris: Secondary | ICD-10-CM | POA: Diagnosis not present

## 2019-02-01 DIAGNOSIS — I10 Essential (primary) hypertension: Secondary | ICD-10-CM | POA: Diagnosis not present

## 2019-02-01 DIAGNOSIS — S72141D Displaced intertrochanteric fracture of right femur, subsequent encounter for closed fracture with routine healing: Secondary | ICD-10-CM | POA: Diagnosis not present

## 2019-02-01 DIAGNOSIS — M545 Low back pain: Secondary | ICD-10-CM | POA: Diagnosis not present

## 2019-02-01 DIAGNOSIS — G4730 Sleep apnea, unspecified: Secondary | ICD-10-CM | POA: Diagnosis not present

## 2019-02-01 DIAGNOSIS — E785 Hyperlipidemia, unspecified: Secondary | ICD-10-CM | POA: Diagnosis not present

## 2019-02-01 DIAGNOSIS — J189 Pneumonia, unspecified organism: Secondary | ICD-10-CM | POA: Diagnosis not present

## 2019-02-01 DIAGNOSIS — Z794 Long term (current) use of insulin: Secondary | ICD-10-CM | POA: Diagnosis not present

## 2019-02-01 DIAGNOSIS — G588 Other specified mononeuropathies: Secondary | ICD-10-CM | POA: Diagnosis not present

## 2019-02-01 DIAGNOSIS — E119 Type 2 diabetes mellitus without complications: Secondary | ICD-10-CM | POA: Diagnosis not present

## 2019-02-01 DIAGNOSIS — M1990 Unspecified osteoarthritis, unspecified site: Secondary | ICD-10-CM | POA: Diagnosis not present

## 2019-02-03 DIAGNOSIS — I2510 Atherosclerotic heart disease of native coronary artery without angina pectoris: Secondary | ICD-10-CM | POA: Diagnosis not present

## 2019-02-03 DIAGNOSIS — G588 Other specified mononeuropathies: Secondary | ICD-10-CM | POA: Diagnosis not present

## 2019-02-03 DIAGNOSIS — E785 Hyperlipidemia, unspecified: Secondary | ICD-10-CM | POA: Diagnosis not present

## 2019-02-03 DIAGNOSIS — M545 Low back pain: Secondary | ICD-10-CM | POA: Diagnosis not present

## 2019-02-03 DIAGNOSIS — Z794 Long term (current) use of insulin: Secondary | ICD-10-CM | POA: Diagnosis not present

## 2019-02-03 DIAGNOSIS — J449 Chronic obstructive pulmonary disease, unspecified: Secondary | ICD-10-CM | POA: Diagnosis not present

## 2019-02-03 DIAGNOSIS — I10 Essential (primary) hypertension: Secondary | ICD-10-CM | POA: Diagnosis not present

## 2019-02-03 DIAGNOSIS — G4730 Sleep apnea, unspecified: Secondary | ICD-10-CM | POA: Diagnosis not present

## 2019-02-03 DIAGNOSIS — E119 Type 2 diabetes mellitus without complications: Secondary | ICD-10-CM | POA: Diagnosis not present

## 2019-02-03 DIAGNOSIS — M1990 Unspecified osteoarthritis, unspecified site: Secondary | ICD-10-CM | POA: Diagnosis not present

## 2019-02-08 DIAGNOSIS — E119 Type 2 diabetes mellitus without complications: Secondary | ICD-10-CM | POA: Diagnosis not present

## 2019-02-08 DIAGNOSIS — M1990 Unspecified osteoarthritis, unspecified site: Secondary | ICD-10-CM | POA: Diagnosis not present

## 2019-02-08 DIAGNOSIS — I10 Essential (primary) hypertension: Secondary | ICD-10-CM | POA: Diagnosis not present

## 2019-02-08 DIAGNOSIS — I2510 Atherosclerotic heart disease of native coronary artery without angina pectoris: Secondary | ICD-10-CM | POA: Diagnosis not present

## 2019-02-08 DIAGNOSIS — M545 Low back pain: Secondary | ICD-10-CM | POA: Diagnosis not present

## 2019-02-08 DIAGNOSIS — E785 Hyperlipidemia, unspecified: Secondary | ICD-10-CM | POA: Diagnosis not present

## 2019-02-08 DIAGNOSIS — G588 Other specified mononeuropathies: Secondary | ICD-10-CM | POA: Diagnosis not present

## 2019-02-08 DIAGNOSIS — Z794 Long term (current) use of insulin: Secondary | ICD-10-CM | POA: Diagnosis not present

## 2019-02-08 DIAGNOSIS — J449 Chronic obstructive pulmonary disease, unspecified: Secondary | ICD-10-CM | POA: Diagnosis not present

## 2019-02-08 DIAGNOSIS — G4730 Sleep apnea, unspecified: Secondary | ICD-10-CM | POA: Diagnosis not present

## 2019-02-10 DIAGNOSIS — I2510 Atherosclerotic heart disease of native coronary artery without angina pectoris: Secondary | ICD-10-CM | POA: Diagnosis not present

## 2019-02-10 DIAGNOSIS — E119 Type 2 diabetes mellitus without complications: Secondary | ICD-10-CM | POA: Diagnosis not present

## 2019-02-10 DIAGNOSIS — I10 Essential (primary) hypertension: Secondary | ICD-10-CM | POA: Diagnosis not present

## 2019-02-10 DIAGNOSIS — G588 Other specified mononeuropathies: Secondary | ICD-10-CM | POA: Diagnosis not present

## 2019-02-10 DIAGNOSIS — Z794 Long term (current) use of insulin: Secondary | ICD-10-CM | POA: Diagnosis not present

## 2019-02-10 DIAGNOSIS — M545 Low back pain: Secondary | ICD-10-CM | POA: Diagnosis not present

## 2019-02-10 DIAGNOSIS — M1990 Unspecified osteoarthritis, unspecified site: Secondary | ICD-10-CM | POA: Diagnosis not present

## 2019-02-10 DIAGNOSIS — E785 Hyperlipidemia, unspecified: Secondary | ICD-10-CM | POA: Diagnosis not present

## 2019-02-10 DIAGNOSIS — G4730 Sleep apnea, unspecified: Secondary | ICD-10-CM | POA: Diagnosis not present

## 2019-02-10 DIAGNOSIS — J449 Chronic obstructive pulmonary disease, unspecified: Secondary | ICD-10-CM | POA: Diagnosis not present

## 2019-02-22 IMAGING — DX DG HIP (WITH OR WITHOUT PELVIS) 2-3V*R*
3 series · 3 of 3 positions shown · non-contrast
Comparison: None.

CLINICAL DATA: 80 y/o M; status post fall with severe right hip
pain.

EXAM:
DG HIP (WITH OR WITHOUT PELVIS) 2-3V RIGHT

[pelvis ap]
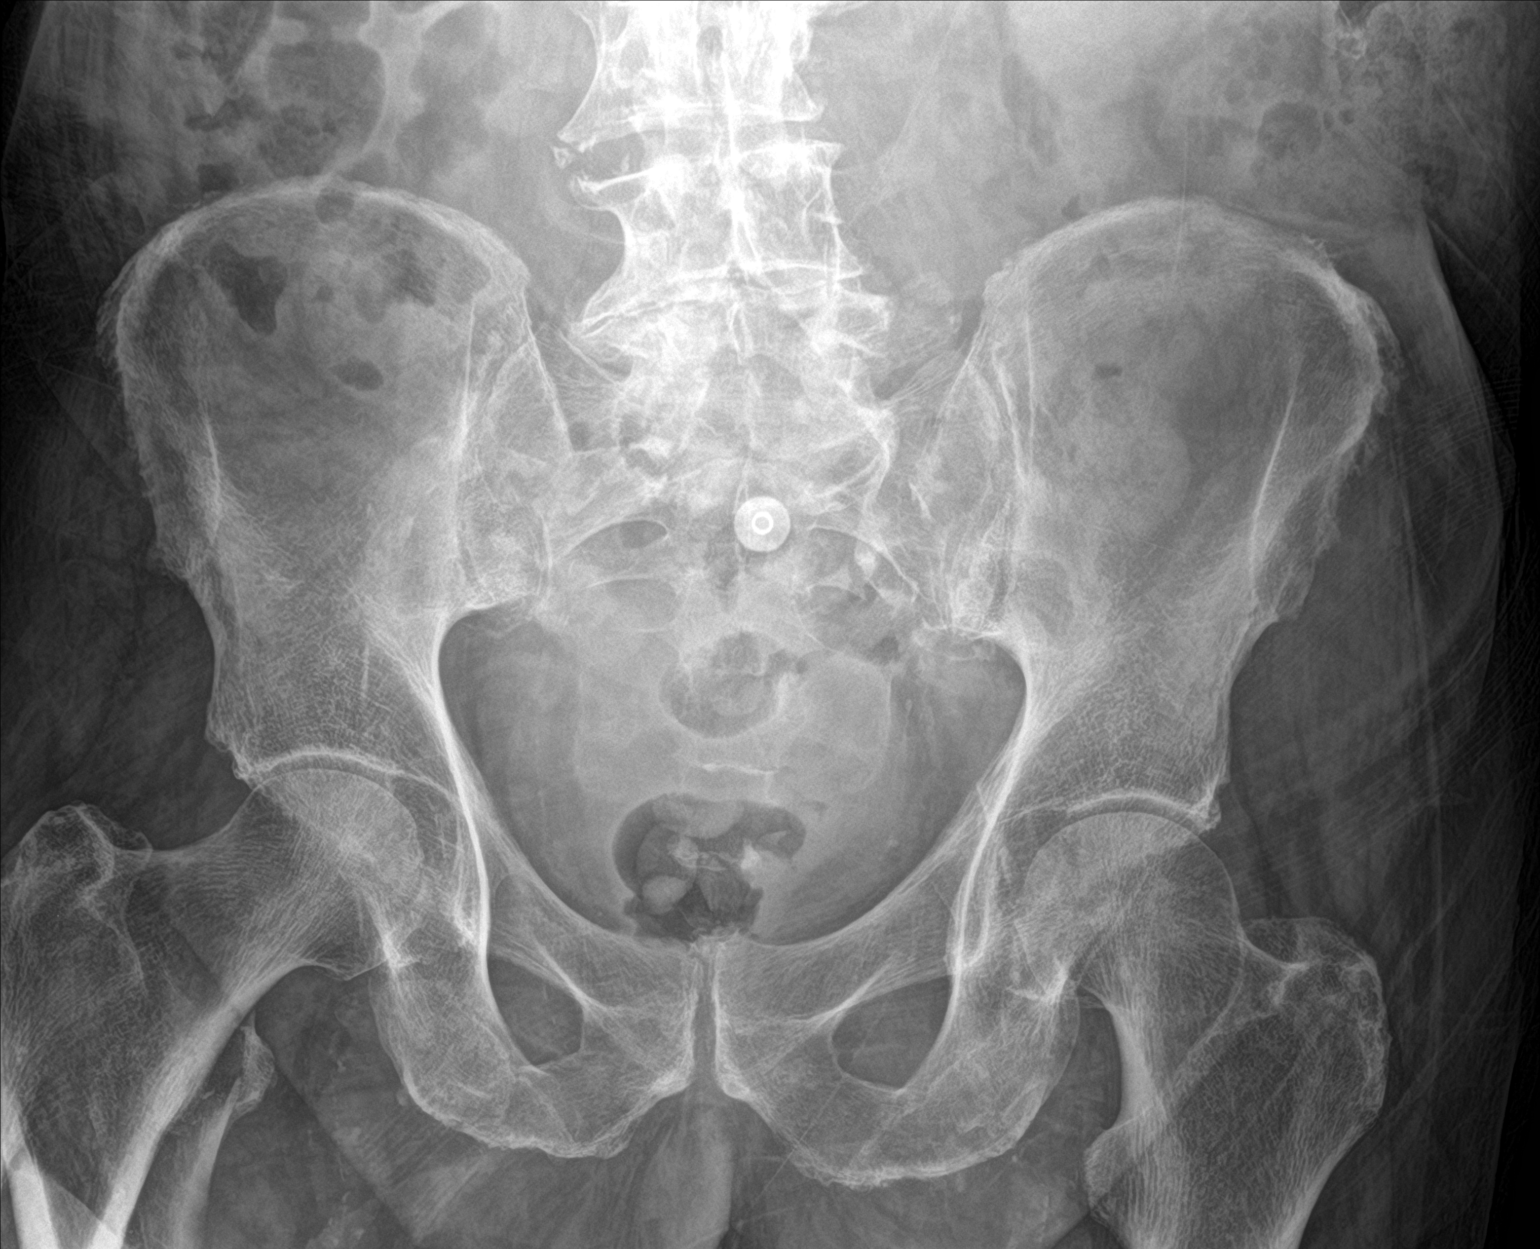

[hip ap]
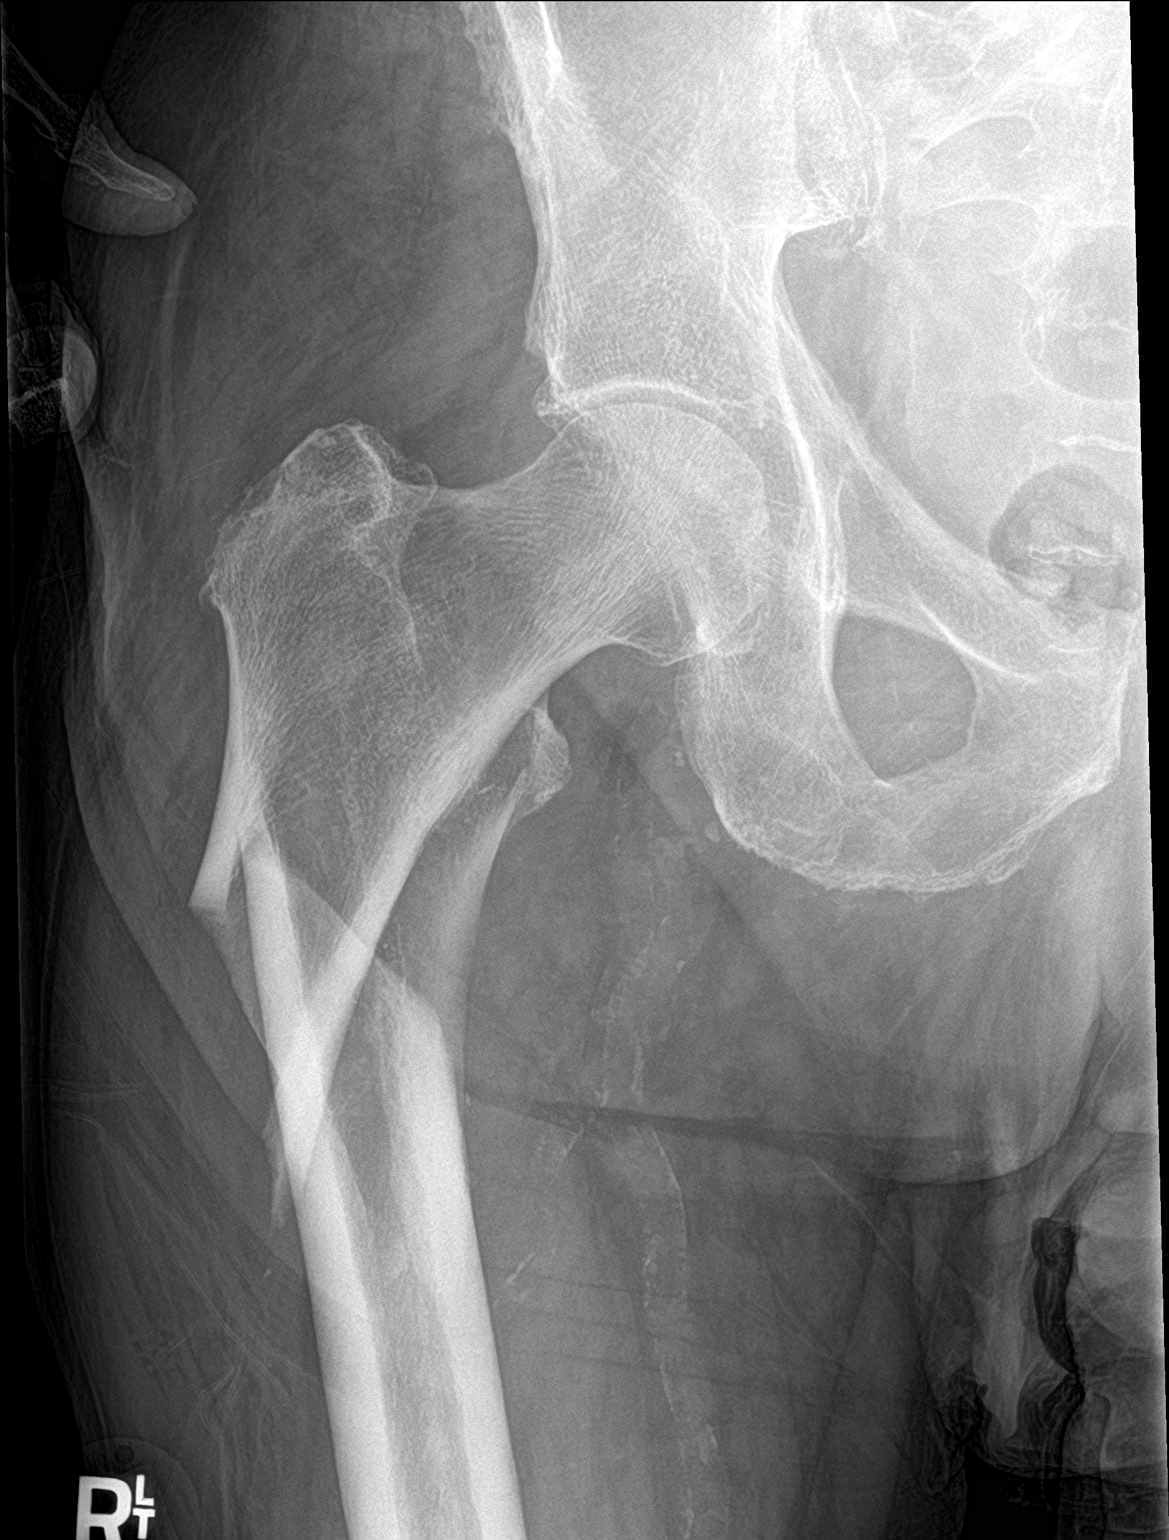

[hip lat]
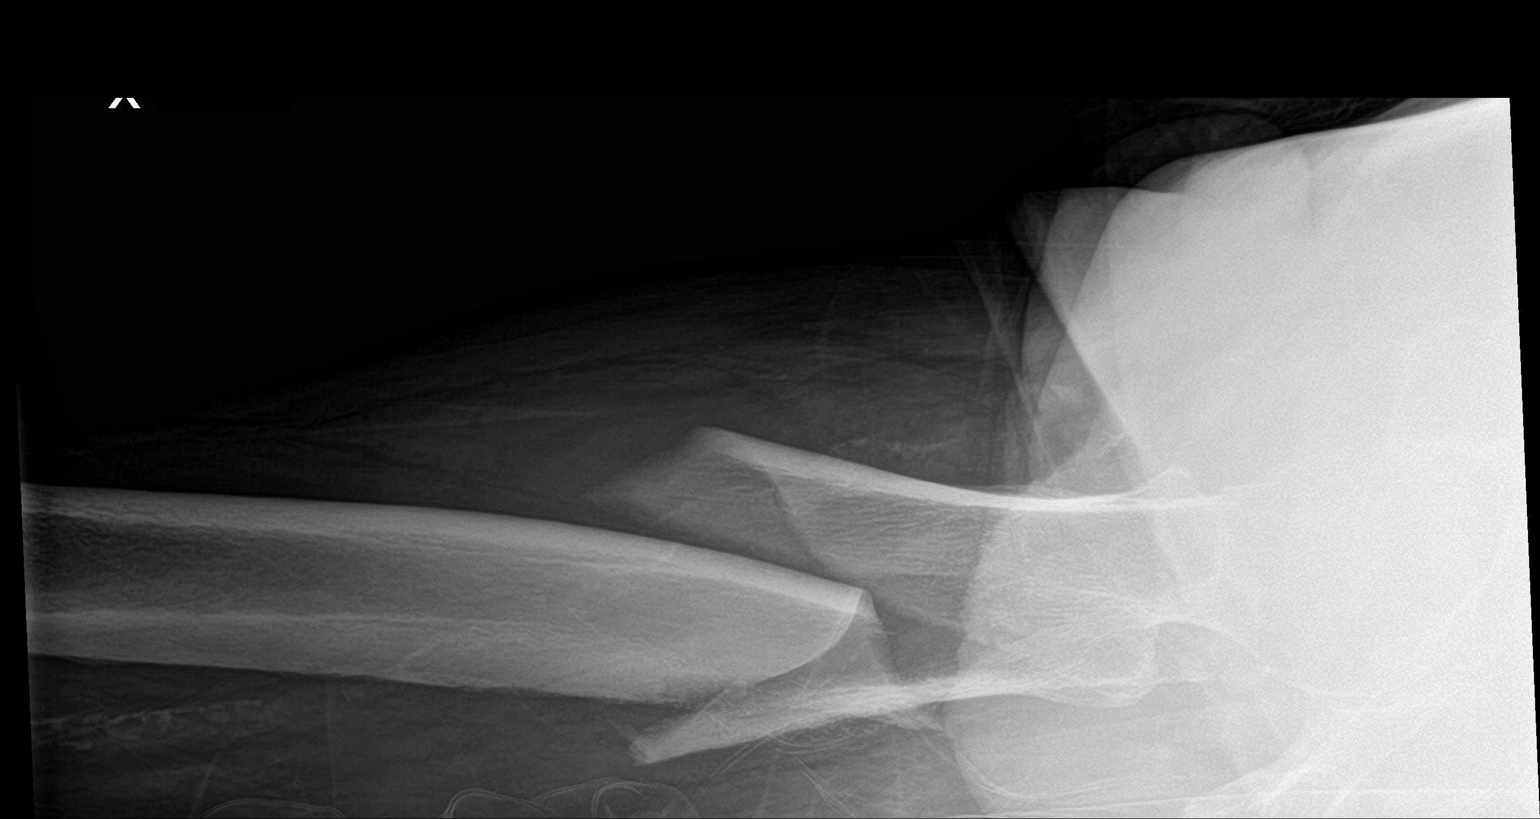

[3 of 3 positions shown; findings below may reference images not displayed]

FINDINGS: The acute intertrochanteric fracture of the right proximal femur
extending into the proximal diaphysis with apex lateral angulation
and minimal displacement. No dislocation of the hip joint. No pelvic
fracture or diastases identified. Vascular calcifications for
IMPRESSION: Acute intertrochanteric fracture right proximal femur extending into
proximal diaphysis with apex lateral angulation and minimal
displacement.

By: Gladyselis Blabur M.D.

## 2019-02-23 IMAGING — DX DG FEMUR 2+V PORT*R*
1 series · 4 of 4 positions shown · non-contrast
Comparison: 08/23/2017

CLINICAL DATA: Right IM nail

EXAM:
RIGHT FEMUR PORTABLE 2 VIEW

[Series 1: femur · 0.14mm/px · 4 of 4 slices shown]
[im 1/4]
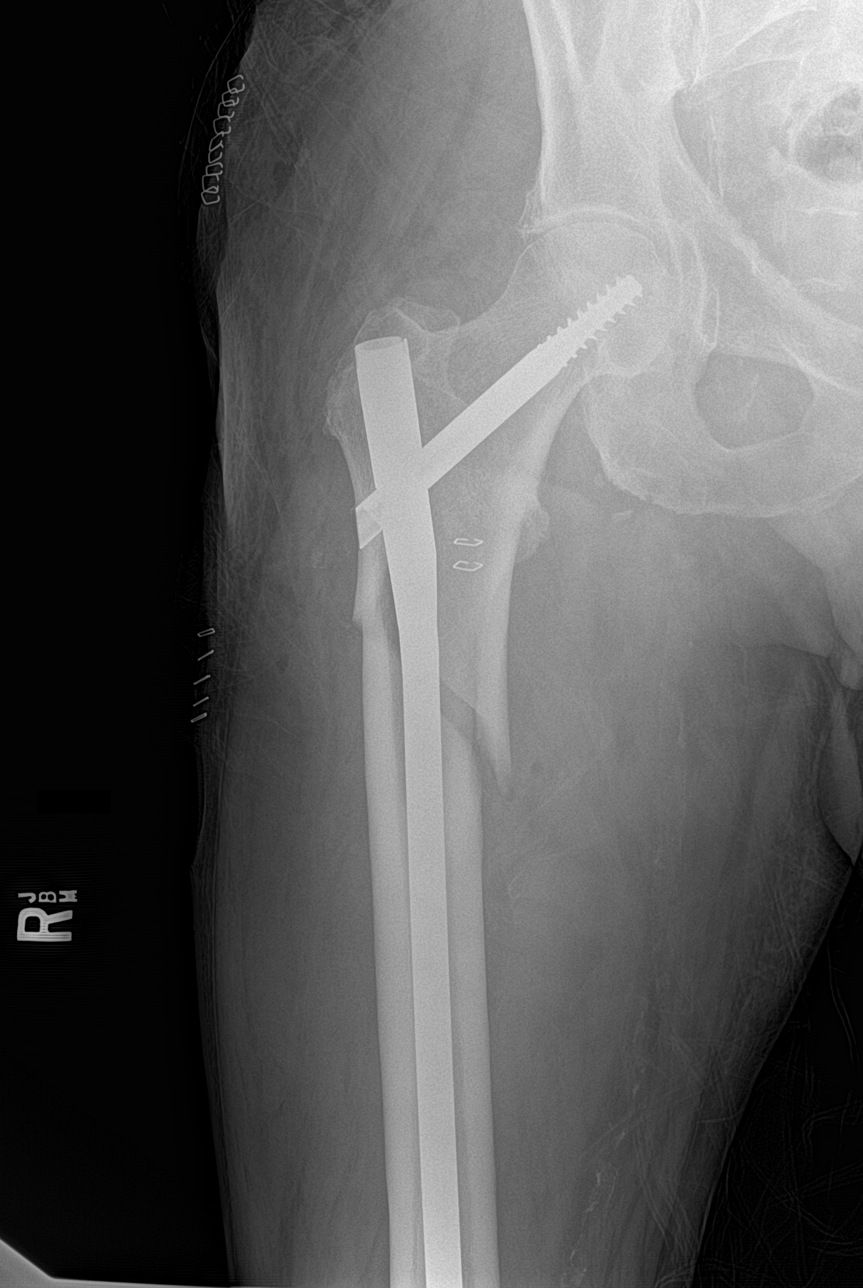
[im 2/4]
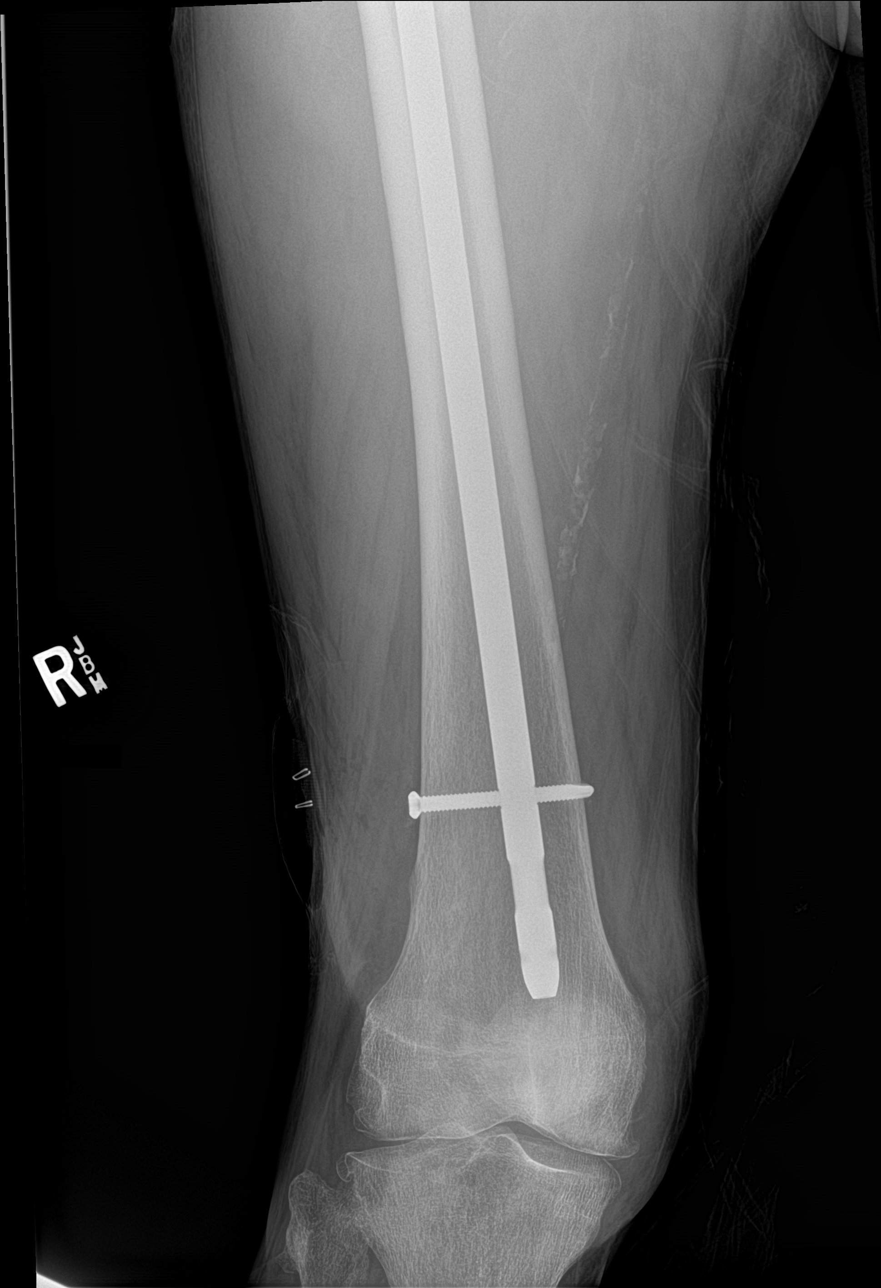
[im 3/4]
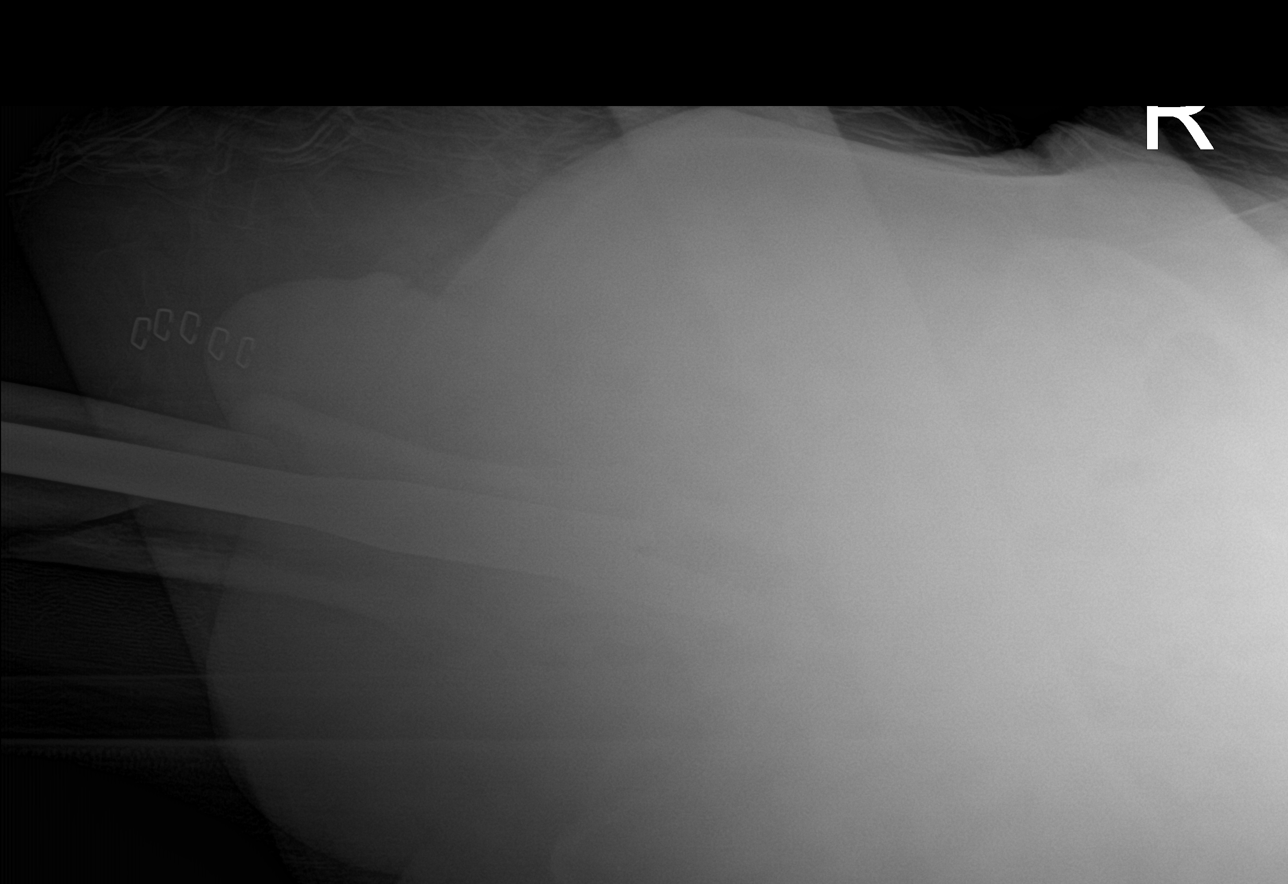
[im 4/4]
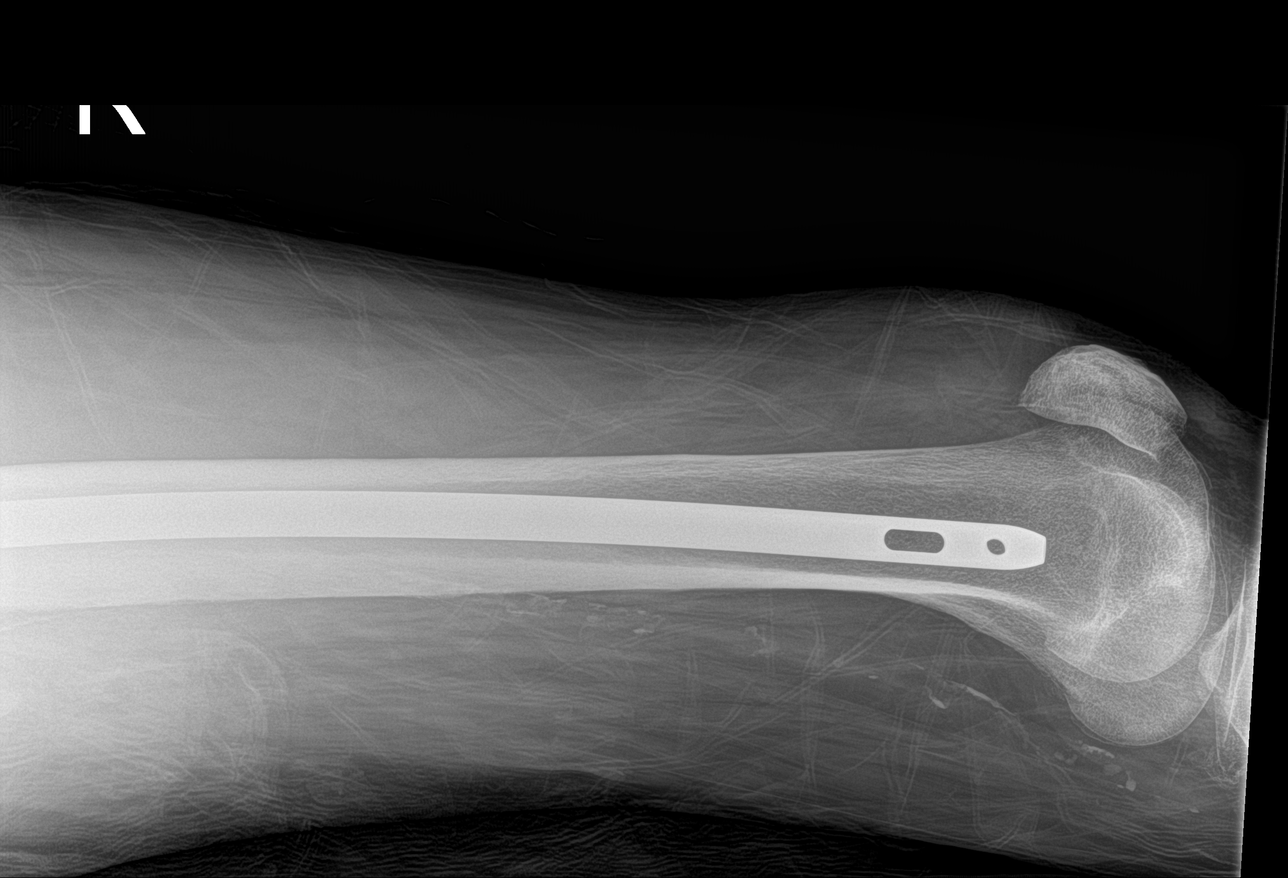

[4 of 4 positions shown; findings below may reference images not displayed]

FINDINGS: Intramedullary rod and hip screw reduces the obliquely oriented
fracture involving the proximal diaphysis of the right femur. The
hardware components and fracture fragments are in anatomic
alignment.
IMPRESSION: 1. Status post ORIF of right femur fracture.

## 2019-02-26 ENCOUNTER — Ambulatory Visit: Payer: Medicare HMO | Admitting: "Endocrinology

## 2019-03-02 ENCOUNTER — Other Ambulatory Visit: Payer: Self-pay | Admitting: "Endocrinology

## 2019-03-04 ENCOUNTER — Other Ambulatory Visit: Payer: Self-pay

## 2019-03-04 DIAGNOSIS — S72141D Displaced intertrochanteric fracture of right femur, subsequent encounter for closed fracture with routine healing: Secondary | ICD-10-CM | POA: Diagnosis not present

## 2019-03-04 DIAGNOSIS — J189 Pneumonia, unspecified organism: Secondary | ICD-10-CM | POA: Diagnosis not present

## 2019-03-04 DIAGNOSIS — J449 Chronic obstructive pulmonary disease, unspecified: Secondary | ICD-10-CM | POA: Diagnosis not present

## 2019-03-04 MED ORDER — INSULIN PEN NEEDLE 31G X 8 MM MISC: 1.0000 | Freq: Every day | 2 refills | Status: AC

## 2019-03-26 DEATH — deceased

## 2019-04-06 ENCOUNTER — Ambulatory Visit: Payer: Medicare HMO | Admitting: "Endocrinology

## 2020-01-03 IMAGING — DX DG CHEST 2V
2 series · 2 of 2 positions shown · non-contrast
Comparison: 10/17/2017, CT 12/08/2017

CLINICAL DATA: 81-year-old male with a history of dyspnea on
exertion

EXAM:
CHEST - 2 VIEW

[chest ap]
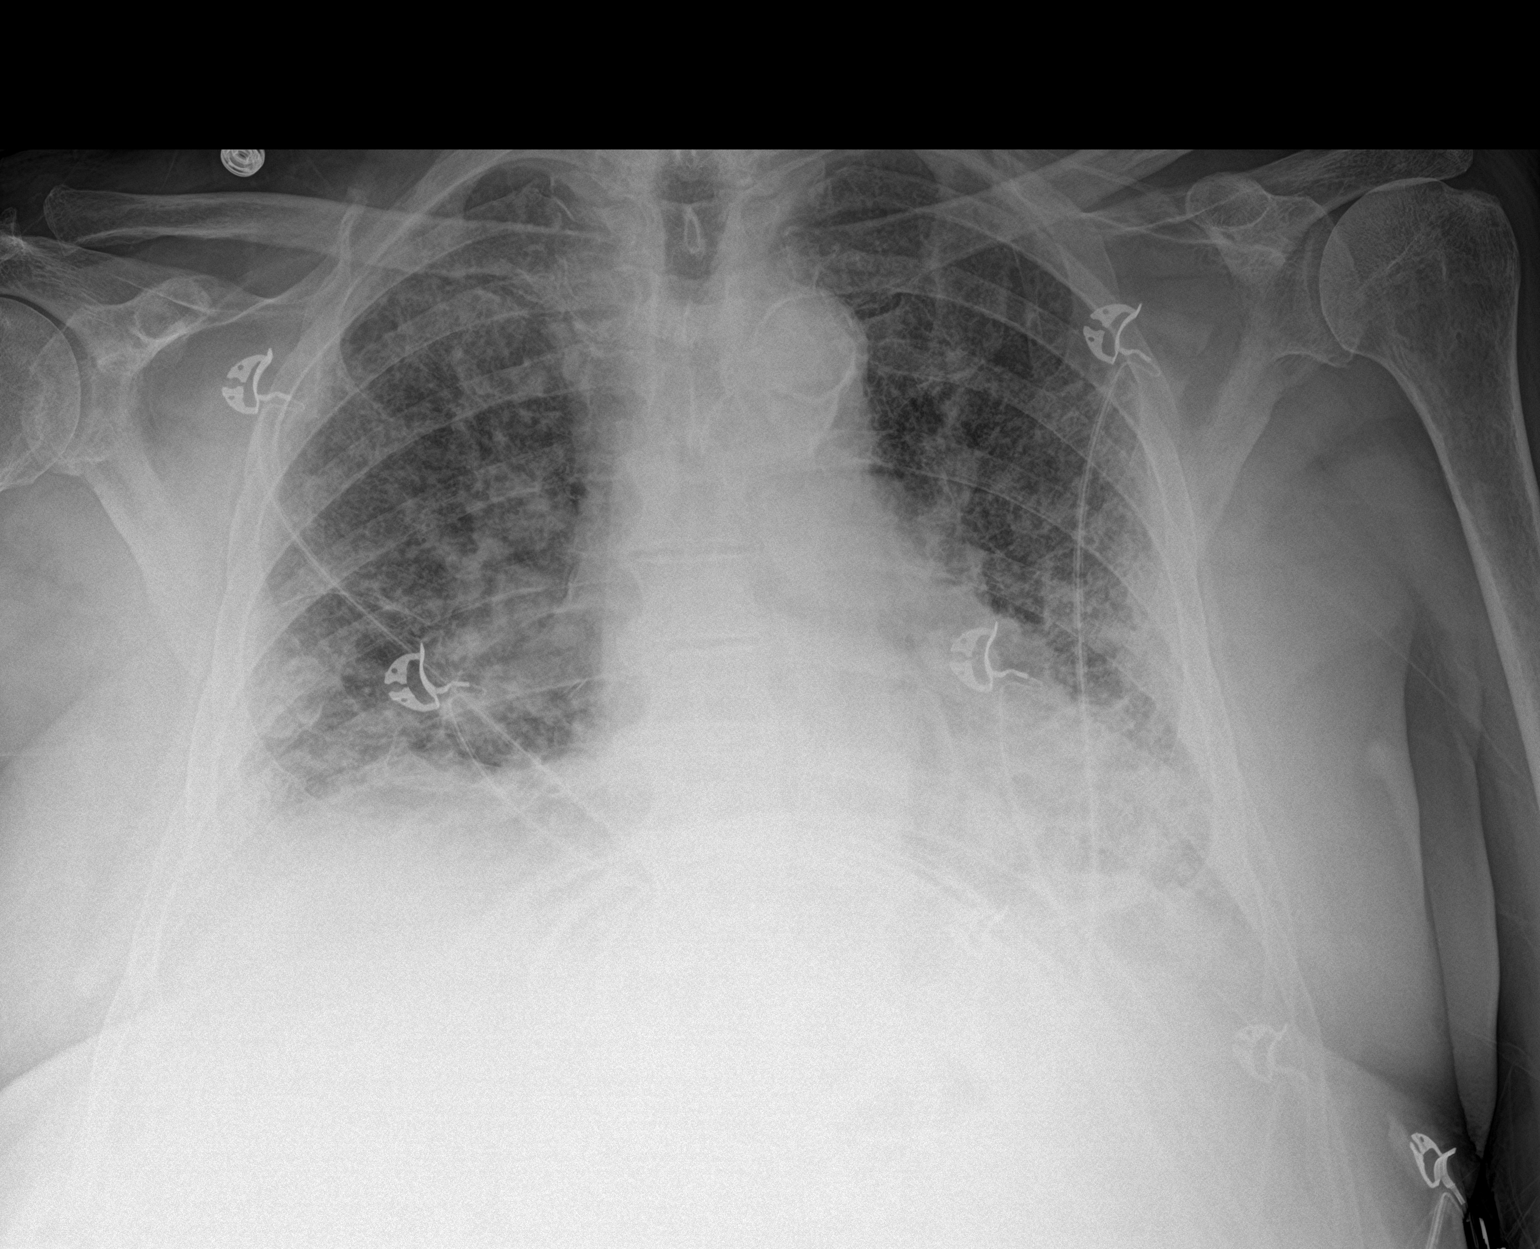

[chest lat]
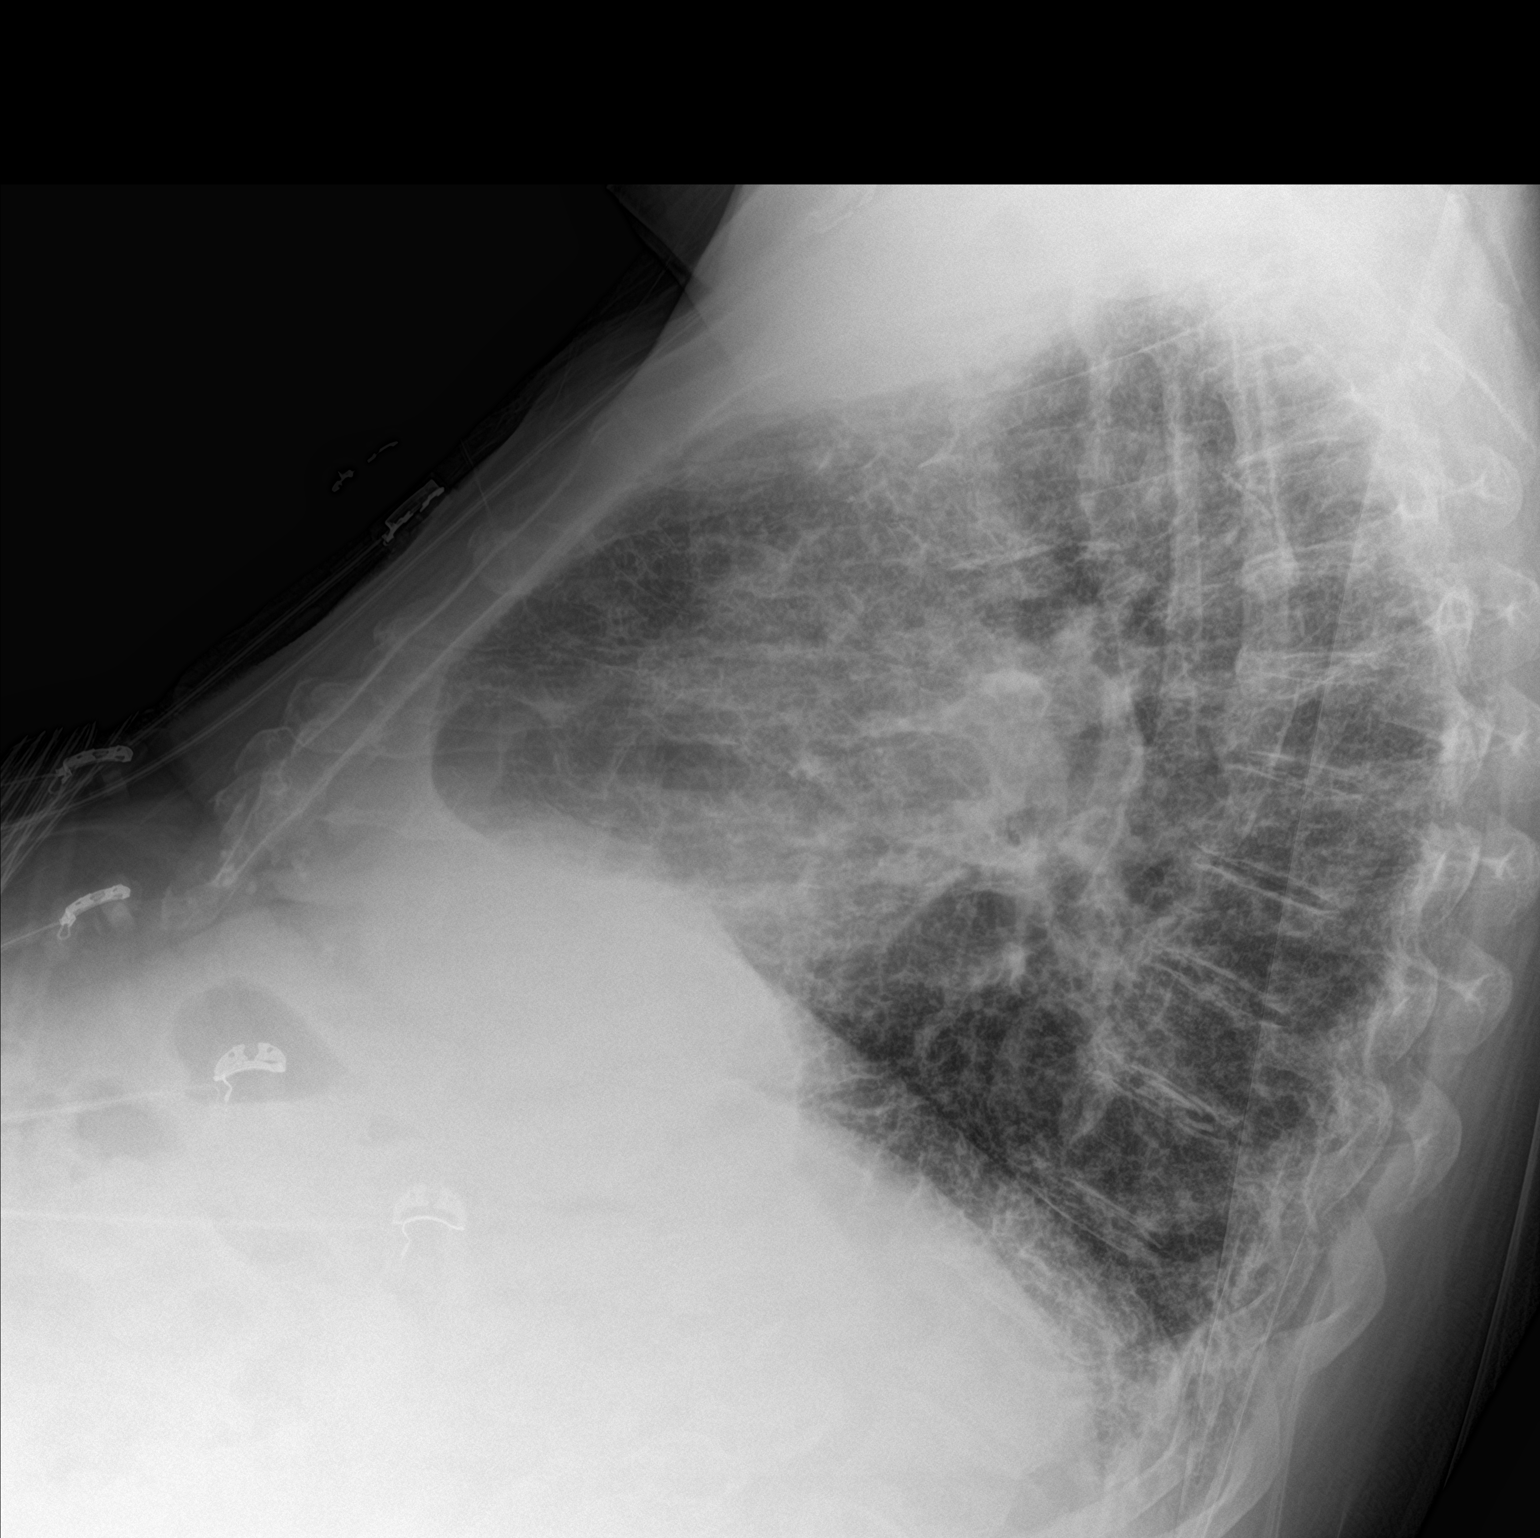

[2 of 2 positions shown; findings below may reference images not displayed]

FINDINGS: Cardiomediastinal silhouette unchanged in size and contour.
Calcifications of the aortic arch.

Low lung volumes. Reticular opacity of the bilateral lungs, with
superimposed patchy airspace opacities, worsened interstitial
opacities, and blunting of the costophrenic angles. No pneumothorax.
IMPRESSION: Worsened interstitial and airspace opacities, compatible with
infection and/or edema superimposed on the patient's known
interstitial fibrosis.

Small pleural effusions not excluded.
